# Patient Record
Sex: Female | Born: 1935 | Race: White | Hispanic: No | Smoking: Former smoker
Health system: Southern US, Community
[De-identification: ages and names within clinical notes are randomized; demographics above are authoritative.]

## PROBLEM LIST (undated history)

## (undated) DIAGNOSIS — J449 Chronic obstructive pulmonary disease, unspecified: Secondary | ICD-10-CM

## (undated) DIAGNOSIS — I714 Abdominal aortic aneurysm, without rupture, unspecified: Secondary | ICD-10-CM

## (undated) DIAGNOSIS — M359 Systemic involvement of connective tissue, unspecified: Secondary | ICD-10-CM

## (undated) DIAGNOSIS — E7800 Pure hypercholesterolemia, unspecified: Secondary | ICD-10-CM

## (undated) DIAGNOSIS — I10 Essential (primary) hypertension: Secondary | ICD-10-CM

## (undated) DIAGNOSIS — M810 Age-related osteoporosis without current pathological fracture: Secondary | ICD-10-CM

## (undated) DIAGNOSIS — K219 Gastro-esophageal reflux disease without esophagitis: Secondary | ICD-10-CM

## (undated) DIAGNOSIS — R0600 Dyspnea, unspecified: Secondary | ICD-10-CM

## (undated) DIAGNOSIS — G629 Polyneuropathy, unspecified: Secondary | ICD-10-CM

## (undated) DIAGNOSIS — I671 Cerebral aneurysm, nonruptured: Secondary | ICD-10-CM

## (undated) DIAGNOSIS — Z87898 Personal history of other specified conditions: Secondary | ICD-10-CM

## (undated) DIAGNOSIS — I639 Cerebral infarction, unspecified: Secondary | ICD-10-CM

## (undated) HISTORY — PX: OTHER SURGICAL HISTORY: SHX169

## (undated) HISTORY — PX: BACK SURGERY: SHX140

## (undated) HISTORY — PX: CAROTID ENDARTERECTOMY: SUR193

## (undated) HISTORY — PX: BRAIN SURGERY: SHX531

---

## 2004-09-21 ENCOUNTER — Ambulatory Visit: Payer: Self-pay | Admitting: Internal Medicine

## 2005-10-12 ENCOUNTER — Ambulatory Visit: Payer: Self-pay | Admitting: Internal Medicine

## 2006-06-09 ENCOUNTER — Ambulatory Visit: Payer: Self-pay | Admitting: Internal Medicine

## 2006-06-10 ENCOUNTER — Ambulatory Visit: Payer: Self-pay | Admitting: Internal Medicine

## 2006-07-08 ENCOUNTER — Ambulatory Visit: Payer: Self-pay | Admitting: Specialist

## 2006-10-20 ENCOUNTER — Ambulatory Visit: Payer: Self-pay | Admitting: Internal Medicine

## 2006-11-21 ENCOUNTER — Ambulatory Visit: Payer: Self-pay | Admitting: Internal Medicine

## 2007-01-14 ENCOUNTER — Other Ambulatory Visit: Payer: Self-pay

## 2007-01-14 ENCOUNTER — Emergency Department: Payer: Self-pay | Admitting: Emergency Medicine

## 2007-01-20 ENCOUNTER — Ambulatory Visit: Payer: Self-pay | Admitting: Internal Medicine

## 2007-11-02 ENCOUNTER — Ambulatory Visit: Payer: Self-pay | Admitting: Internal Medicine

## 2007-11-13 ENCOUNTER — Ambulatory Visit: Payer: Self-pay | Admitting: Internal Medicine

## 2008-11-04 ENCOUNTER — Ambulatory Visit: Payer: Self-pay | Admitting: Internal Medicine

## 2009-12-01 ENCOUNTER — Ambulatory Visit: Payer: Self-pay | Admitting: Internal Medicine

## 2010-12-03 ENCOUNTER — Ambulatory Visit: Payer: Self-pay | Admitting: Internal Medicine

## 2011-01-12 ENCOUNTER — Ambulatory Visit: Payer: Self-pay | Admitting: Internal Medicine

## 2011-01-19 ENCOUNTER — Ambulatory Visit: Payer: Self-pay | Admitting: Gastroenterology

## 2011-05-05 ENCOUNTER — Ambulatory Visit: Payer: Self-pay

## 2011-05-14 ENCOUNTER — Ambulatory Visit: Payer: Self-pay | Admitting: Unknown Physician Specialty

## 2011-05-14 DIAGNOSIS — I10 Essential (primary) hypertension: Secondary | ICD-10-CM

## 2011-05-18 ENCOUNTER — Ambulatory Visit: Payer: Self-pay | Admitting: Unknown Physician Specialty

## 2011-12-07 ENCOUNTER — Ambulatory Visit: Payer: Self-pay | Admitting: Internal Medicine

## 2012-12-07 ENCOUNTER — Ambulatory Visit: Payer: Self-pay | Admitting: Internal Medicine

## 2013-01-03 ENCOUNTER — Ambulatory Visit: Payer: Self-pay | Admitting: Internal Medicine

## 2013-01-05 ENCOUNTER — Ambulatory Visit: Payer: Self-pay | Admitting: Internal Medicine

## 2013-05-08 ENCOUNTER — Ambulatory Visit: Payer: Self-pay | Admitting: Internal Medicine

## 2013-05-25 ENCOUNTER — Ambulatory Visit: Payer: Self-pay | Admitting: Neurology

## 2013-12-10 ENCOUNTER — Ambulatory Visit: Payer: Self-pay | Admitting: Internal Medicine

## 2014-06-15 ENCOUNTER — Emergency Department: Payer: Self-pay | Admitting: Emergency Medicine

## 2014-06-15 LAB — BASIC METABOLIC PANEL
Anion Gap: 5 — ABNORMAL LOW (ref 7–16)
BUN: 17 mg/dL (ref 7–18)
CO2: 27 mmol/L (ref 21–32)
Calcium, Total: 8.5 mg/dL (ref 8.5–10.1)
Chloride: 102 mmol/L (ref 98–107)
Creatinine: 1.28 mg/dL (ref 0.60–1.30)
EGFR (African American): 52 — ABNORMAL LOW
GFR CALC NON AF AMER: 43 — AB
Glucose: 95 mg/dL (ref 65–99)
Osmolality: 270 (ref 275–301)
Potassium: 4.5 mmol/L (ref 3.5–5.1)
Sodium: 134 mmol/L — ABNORMAL LOW (ref 136–145)

## 2014-06-15 LAB — CBC
HCT: 37 % (ref 35.0–47.0)
HGB: 12.7 g/dL (ref 12.0–16.0)
MCH: 30.1 pg (ref 26.0–34.0)
MCHC: 34.5 g/dL (ref 32.0–36.0)
MCV: 87 fL (ref 80–100)
PLATELETS: 178 10*3/uL (ref 150–440)
RBC: 4.24 10*6/uL (ref 3.80–5.20)
RDW: 14.1 % (ref 11.5–14.5)
WBC: 5.6 10*3/uL (ref 3.6–11.0)

## 2014-06-15 LAB — TROPONIN I: Troponin-I: <0.02 ng/mL

## 2014-08-09 DIAGNOSIS — W19XXXA Unspecified fall, initial encounter: Secondary | ICD-10-CM | POA: Diagnosis present

## 2014-08-09 DIAGNOSIS — I639 Cerebral infarction, unspecified: Secondary | ICD-10-CM | POA: Diagnosis present

## 2014-08-09 DIAGNOSIS — U071 COVID-19: Secondary | ICD-10-CM | POA: Diagnosis present

## 2014-08-09 DIAGNOSIS — N183 Chronic kidney disease, stage 3 unspecified: Secondary | ICD-10-CM | POA: Diagnosis present

## 2014-08-09 HISTORY — DX: Cerebral infarction, unspecified: I63.9

## 2014-10-07 ENCOUNTER — Inpatient Hospital Stay: Payer: Self-pay | Admitting: Internal Medicine

## 2014-10-11 ENCOUNTER — Ambulatory Visit: Payer: Self-pay | Admitting: Neurology

## 2014-10-21 ENCOUNTER — Ambulatory Visit: Payer: Self-pay | Admitting: Internal Medicine

## 2014-12-02 LAB — SURGICAL PATHOLOGY

## 2014-12-08 NOTE — Discharge Summary (Signed)
PATIENT NAME:  Donna Quinn, Donna Quinn MR#:  [id number] DATE OF BIRTH:  02/21/1936  DATE OF ADMISSION:  10/07/2014 DATE OF DISCHARGE:  10/16/2014  REASON FOR ADMISSION: Right-sided weakness with slurred speech.   HISTORY OF PRESENT ILLNESS: Please see the dictated HPI done by Dr. Imogene Burnhen on 10/07/2014.   PAST MEDICAL HISTORY: 1.  Benign hypertension.  2.  COPD.  3.  Peripheral vascular disease with carotid artery stenosis.  4.  History of TIA.  5.  History of cerebral aneurysm.  6.  GE reflux disease.  7.  Hyperlipidemia.   MEDICATIONS ON ADMISSION: Please see admission note.   ALLERGIES: AGGRENOX, AUGMENTIN, PENICILLIN, REMERON, SULFA, AND XYZAL.   SOCIAL HISTORY, FAMILY HISTORY, AND REVIEW OF SYSTEMS: As per admission note.   PHYSICAL EXAMINATION: GENERAL: The patient was in no acute distress.  VITAL SIGNS: Stable and she was afebrile.  HEENT: Unremarkable.  NECK: Supple without JVD. No bruits were noted.  LUNGS: Clear without wheezes or rales.  CARDIAC: Revealed a regular rate and rhythm with normal S1, S2.  ABDOMEN: Soft and nontender.  EXTREMITIES: Without edema.  NEUROLOGIC: Grossly nonfocal other than some right-sided weakness.   HOSPITAL COURSE: The patient was admitted with possible CVA. She was placed on telemetry with aspirin and Plavix. Echocardiogram was unremarkable. MRI of the brain did show an acute infarct consistent with acute stroke syndrome. Carotid Dopplers showed a near-complete occlusion of left carotid artery. She was seen in consultation by vascular surgery who recommended carotid endarterectomy. She was subsequently placed on a heparin drip. She was seen by cardiology for cardiac clearance prior to left carotid endarterectomy. She was cleared by cardiology and underwent surgery on 10/10/2014. She tolerated the procedure well. She was monitored in the Intensive Care Unit overnight. She was then transferred out to the floor where she continued to do well. By 10/16/2014,  the patient was stable and ready for discharge.   DISCHARGE DIAGNOSES: 1.  Acute stroke syndrome.  2.  Peripheral vascular disease with carotid stenosis.  3.  Chronic obstructive pulmonary disease.  4.  Anemia.  5.  Status post left carotid endarterectomy.   DISCHARGE MEDICATIONS: 1.  Albuterol 2 puffs every 4 hours p.r.n. shortness of breath.  2.  Vitamin D3, 2000 units p.o. daily.  3.  Losartan 50 mg p.o. daily.  4.  Gabapentin 300 mg p.o. b.i.d.  5.  Advair 250/50 one puff b.i.d.  6.  Lovastatin 40 mg p.o. daily.  7.  Aspirin 81 mg p.o. daily.  8.  Plavix 75 mg p.o. daily.  9.  DuoNeb SVNs q.i.d.  10.   Norco 5/325 one p.o. every 4-6 hours as needed for pain.  11.   Zyprexa 5 mg p.o. daily.  12.   Lorazepam 0.5 mg p.o. q. 4 hours p.r.n. anxiety.  13.   Protonix 40 mg p.o. daily.   FOLLOWUP PLANS AND APPOINTMENTS: The patient was discharged with oxygen at 2 liters. She will be followed by home health. She is on a low-fat, low-cholesterol diet. She will follow up with me within 1 week's time, sooner if needed.      ____________________________ Duane LopeJeffrey D. Judithann SheenSparks, MD jds:at D: 10/24/2014 07:15:17 ET T: 10/24/2014 09:43:53 ET JOB#: [id number]  cc: Duane LopeJeffrey D. Judithann SheenSparks, MD, <Dictator> Koleen Celia Rodena Medin Ruby Dilone MD ELECTRONICALLY SIGNED 10/24/2014 12:14

## 2014-12-08 NOTE — H&P (Signed)
PATIENT NAME:  Donna Quinn, Donna Quinn MR#:  [id number] DATE OF BIRTH:  09/29/1935  DATE OF ADMISSION:  10/07/2014  PRIMARY CARE PHYSICIAN:  Sparks.   REFERRING PHYSICIAN:  Dr. York CeriseForbach.   CHIEF COMPLAINT: Right arm weakness for 2 days, slurred speech today.   HISTORY OF PRESENT ILLNESS: A 79 year old Caucasian female with a history of hypertension and COPD, presented to the ED with the above chief complaint. The patient is alert, awake, oriented, in no acute distress. The patient started to have right arm weakness 2 days ago which is better later, but the patient started to have slurred speech during lunchtime today, in addition right arm weakness became worse, so the patient comes to the ED for further evaluation. The patient denies any dysphagia or incontinence. Denies any extremity numbness or tingling. The patient denies any headache or dizziness.   PAST MEDICAL HISTORY:   1.  Hypertension.  2.  COPD.  3.  Carotid artery stenosis.  4.  TIA.  5.  Brain aneurysm.  6.  GERD.   7.  Hyperlipidemia.  8.  Possible PVD. According to the patient's daughter the patient has bilateral stent in bilateral groin area, but she does not know what kind of medical problem.   SOCIAL HISTORY: No smoking, alcohol drinking, or illicit drugs. The patient quit smoking 2 years ago.   FAMILY HISTORY:  Stroke, but no hypertension, no diabetes.   ALLERGIES:  AGGRENOX, AUGMENTIN, PENICILLIN, REMERON, SULFA DRUGS, XYZAL.   HOME MEDICATIONS: Medication reconciliation list is not done yet; but according to the patient the patient is taking aspirin, Plavix, and a statin at home. We will update later.   REVIEW OF SYSTEMS:  CONSTITUTIONAL: The patient denies any fever or chills. No headache or dizziness, but has weakness.  EYES: No double vision or blurred vision.  EARS, NOSE, AND, THROAT: No postnasal drip, but has slurred speech. No dysphagia.  CARDIOVASCULAR: No chest pain, palpitation, orthopnea, or nocturnal dyspnea.  No leg edema.  PULMONARY: No cough, sputum, but has shortness of breath occasionally due to COPD.  No wheezing. No hemoptysis.  GASTROINTESTINAL: No abdominal pain, nausea, vomiting, diarrhea. No melena or bloody stool.  GENITOURINARY: No dysuria, hematuria, or incontinence.  SKIN: No rash or jaundice.  NEUROLOGIC: No syncope, loss of consciousness, or seizure, but has right arm weakness and slurred speech.  No incontinence.  HEMATOLOGY: No easy bleeding or bleeding.  ENDOCRINOLOGY: No polyuria, polydipsia, heat or cold intolerance.   PHYSICAL EXAMINATION:  VITAL SIGNS: Temperature 98, blood pressure 188/84, pulse 75, O2 saturation 99% in room air.  GENERAL: The patient is alert, awake, oriented, in no acute distress.  HEENT: Pupils round, equal, and reactive to light and accommodation. Moist oral mucosa. Clear oropharynx.  NECK: Supple. No JVD or carotid bruit. No lymphadenopathy. No thyromegaly,  CARDIOVASCULAR: S1, S2 regular rate and rhythm. No murmurs, gallops.  PULMONARY: Bilateral air entry. No wheezing or rales. No use of accessory muscle to breathe.  ABDOMEN: Soft. No distention or tenderness. No organomegaly. Bowel sounds present.  EXTREMITIES: No edema, clubbing, or cyanosis. No calf tenderness. Bilateral pedal pulses present.  SKIN: No rash or jaundice.  NEUROLOGY: A and O x 3. No facial droop. Sensation intact. Power 4 out of 5 on bilateral upper extremities, and 3 out of 5 on the right lower extremity, and 4 out of 5 on left lower extremity. DTR mute.    LABORATORY DATA:  1.  Glucose 98, BUN 20, creatinine 1.43, sodium 139, potassium 4.2,  chloride 105, bicarbonate 27. Troponin less than 0.02. CBC in normal range except hemoglobin 11.3.  2.  CAT scan of head without contrast showed no acute intracranial abnormality, old lacunar infarct, and chronic small vessel ischemic disease, previous aneurysm coiling.   3.  EKG showed normal sinus rhythm at 65 BPM with incomplete right bundle  branch block and left atrial enlargement, left ventricular hypertrophy.   IMPRESSIONS:  1. Possible acute cerebrovascular accident.  2. Accelerated hypertension.  3. Acute renal failure.  4. History of carotid artery stenosis and peripheral vascular disease.  5. Chronic obstructive pulmonary disease.  6. Hyperlipidemia. 7. Gastroesophageal reflux disease.     PLAN OF TREATMENT:  1.  The patient will be admitted to telemetry floor. We will continue telemonitor. Continue aspirin and Plavix, and continue statin. In addition I will get an MRI of her brain, echocardiograph, and a carotid Duplex.  2.  For hypertension, I will continue the patient's home medications.  3.  COPD is stable.  4.  I discussed the patient's condition and plan of treatment with the patient, the patient's husband,  and daughter. The patient wants full code.   TIME SPENT: About 55 minutes.    ____________________________ Shaune Pollack, MD qc:bu D: 10/07/2014 16:44:40 ET T: 10/07/2014 17:32:18 ET JOB#: [id number]  cc: Shaune Pollack, MD, <Dictator> Shaune Pollack MD ELECTRONICALLY SIGNED 10/09/2014 17:07

## 2014-12-08 NOTE — Consult Note (Signed)
PATIENT NAME:  Donna Quinn, Donna Quinn MR#:  [id number] DATE OF BIRTH:  1936/02/22  DATE OF CONSULTATION:  10/10/2014  CONSULTING PHYSICIAN:  Marcina MillardAlexander Makynzi Eastland, MD  PRIMARY CARE PHYSICIAN:  Duane LopeJeffrey D. Judithann SheenSparks, MD  REFERRING PHYSICIAN:  Annice NeedyJason S Dew, MD  CHIEF COMPLAINT:  "I have slurred speech."   REASON FOR CONSULTATION: Consultation requested for cardiovascular evaluation prior to carotid surgery.   HISTORY OF PRESENT ILLNESS: The patient is a 79 year old female with history of hypertension, COPD and prior TIAs.  She was admitted on 10/07/2014, with a 2-day history of right arm weakness and slurred speech. Head CT was negative. MRI showed evidence for acute CVA. The patient was evaluated by Dr. Wyn Quakerew with a carotid ultrasound revealing near occlusive stenosis in the left carotid. Carotid angiogram revealed a near occlusive gradient at 95% stenosis in the left internal carotid artery. The patient was referred for preoperative evaluation prior to carotid endarterectomy. The patient denies prior history of myocardial infarction. She currently denies chest pain.   PAST MEDICAL HISTORY:  1.  Prior TIAs.  2.  Hypertension.  3.  Hyperlipidemia.  4.  COPD.  5.  Peripheral vascular disease.  6.  Osteoporosis.   MEDICATIONS:  Aspirin 81 mg daily, clopidogrel 75 mg daily, losartan 50 mg daily, lovastatin 40 mg daily, albuterol inhaler 2 puffs q.4 h. p.r.n., cholecalciferol 1 tablet daily, Advair Diskus 250/50, one inhalation q.12, gabapentin 300 mg b.i.d., DuoNeb nebulizer q.i.d., Zyprexa 5 mg at bedtime, and omeprazole 20 mg b.i.d.   SOCIAL HISTORY: The patient is married, resides with her husband, has a 50 pack-year tobacco abuse, quit in 2013.  FAMILY HISTORY:  Positive for myocardial infarction.   REVIEW OF SYSTEMS:    CONSTITUTIONAL: No fever or chills.  EYES: No blurry vision.  EARS: No hearing loss.  RESPIRATORY: Chronic exertional dyspnea due to underlying COPD.  CARDIOVASCULAR: No chest  pain.  GASTROINTESTINAL: No nausea, vomiting, or diarrhea.  GENITOURINARY: No dysuria or hematuria.  ENDOCRINE: No polyuria or polydipsia.  MUSCULOSKELETAL: No arthralgias or myalgias.  NEUROLOGICAL: No focal muscle weakness or numbness.  PSYCHOLOGICAL: No depression or anxiety.   PHYSICAL EXAMINATION:  VITAL SIGNS: Blood pressure 152/53, pulse 65, respirations 20, temperature 98.3, pulse oximetry 99%.  HEENT: Pupils equal and reactive to light and accommodation.  NECK: Supple without thyromegaly.  LUNGS: Revealed decreased breath sounds bilaterally.  CARDIOVASCULAR: Normal JVP. Normal PMI. Regular rate and rhythm. Normal S1, S2. No appreciable gallop, murmur, or rub.  ABDOMEN: Soft and nontender. Pulses were intact bilaterally.  MUSCULOSKELETAL: Normal muscle tone.  NEUROLOGIC: The patient is alert and oriented x3. Motor and sensory both grossly intact.   IMPRESSION: A 79 year old female with prior history of transient ischemic attack who presents with acute cerebrovascular accident with high-grade, greater than 95%, left carotid stenosis awaiting carotid endarterectomy. The patient has no prior history of myocardial infarction, currently denies chest pain, EKG is nondiagnostic. The patient should be an acceptable risk for surgery.   RECOMMENDATIONS:  1.  Agree with overall current therapy.  2.  Proceed with surgery as planned.  3.  Continue dual antiplatelet therapy.  4.  Would defer further cardiac evaluation at this time.     ____________________________ Marcina MillardAlexander Navdeep Halt, MD ap:at D: 10/10/2014 16:34:46 ET T: 10/10/2014 19:59:56 ET JOB#: [id number]  cc: Marcina MillardAlexander Millette Halberstam, MD, <Dictator> Marcina MillardALEXANDER Brittini Brubeck MD ELECTRONICALLY SIGNED 10/15/2014 10:09

## 2014-12-08 NOTE — Op Note (Signed)
PATIENT NAME:  Donna Quinn, Donna Quinn MR#:  [id number] DATE OF BIRTH:  Dec 25, 1935  DATE OF PROCEDURE:  10/14/2014  PREOPERATIVE DIAGNOSES:  1.  Near occlusive left carotid artery stenosis with recent stroke and recurring transient ischemic attack symptoms.  2.  Chronic obstructive pulmonary disease.  3.  Hypertension.  4.  Hyperlipidemia.   POSTOPERATIVE DIAGNOSES:   1.  Near occlusive left carotid artery stenosis with recent stroke and recurring transient ischemic attack symptoms.  2.  Chronic obstructive pulmonary disease.  3.  Hypertension.  4.  Hyperlipidemia.   PROCEDURE PERFORMED:  Left carotid endarterectomy with CorMatrix arterial reconstruction.   SURGEON:  Annice Needy, MD  ANESTHESIA: General.   BLOOD LOSS: 50 mL.   INDICATION FOR PROCEDURE: This is a 79 year old female who was admitted to the hospital last week with stroke-type symptoms. She has MRI positive for stroke in the left hemisphere and she has had 3 separate episodes of recurring aphasia and right-sided weakness that resolved quickly consistent with TIAs after her stroke. She was found to have a very near occlusive left carotid artery stenosis on both duplex and angiogram. She was kept on heparin, allowed to heal for about 1 week post stroke and taken to the Operating Room for endarterectomy. We discussed that this was a little sooner than we usually perform an endarterectomy after a stroke, but given the near occlusive nature of the stenosis and the recurring symptoms, early revascularization was indicated. Risks and benefits were discussed and informed consent was obtained.   DESCRIPTION OF PROCEDURE: The patient is brought to the operative suite and after an adequate level of general anesthesia was obtained, she was placed in modified beach chair position. A roll was placed under her shoulder and her neck was flexed and turned to the side and then her skin was sterilely prepped and draped. After appropriate surgical  antibiotics and timeouts, an incision was created along the anterior border of the sternocleidomastoid. We dissected down to the platysma with electrocautery. A crossing venous branch was ligated and divided between silk ties. This exposed the facial vein, which was ligated and divided between silk ties. The carotid bifurcation was very calcific. I anesthetized the bulb with 1% lidocaine. I then began dissecting out the bulb. I encircled the common carotid artery, external carotid artery, superior thyroid artery, and internal carotid artery distal to the lesion, and heparinized the patient. After the heparin had been allowed to circulate for about 5 minutes, control was pulled up on the vessel loops and anterior arteriotomy was created with an 11 blade and extended with Potts scissors. The Pruitt-Inahara shunt was then placed in the field. It was placed first in the internal carotid artery, flushed, de-aired, then in the common carotid artery. Approximately 1 to 2 minutes lapsed between clamping and shunt placement. I then performed an endarterectomy in the typical fashion with the elevator. An eversion endarterectomy was performed in the external carotid artery. The proximal endpoint was cut flush with tenotomy scissors and a nice feathered distal endpoint was created on the distal internal carotid artery. All loose flecks were removed and the vessel was locally heparinized. The distal endpoint was tacked down with three 7-0 Prolene sutures. A CorMatrix arterial patch was brought in the field. It was cut and beveled, and a 6-0 Prolene was started at the distal endpoint and run one half the length of the arteriotomy.  It was then cut and beveled to an appropriate length to match the arteriotomy and a second  6-0 Prolene was started at the proximal endpoint. The medial suture line was run together and tied. The lateral suture line was run approximately one quarter length of the length of the arteriotomy, and then the  Pruitt-Inahara shunt was then removed.  I then completed the suture line, flushing several cardiac cycles out the external carotid artery and flushing and de-airing through the external carotid artery prior to releasing control. On release, there was a nice pulse of the distal internal carotid artery distal to the endarterectomy. Approximately 3 minutes elapsed from shunt removal to restoration flow to the brain. The wound was then irrigated. Surgicel and Evicel topical hemostatic agents were placed. Hemostasis was complete. The sternocleidomastoid space was reapproximated with three 3-0 Vicryl sutures. The platysma was closed with a running 3-0 Vicryl and the skin was closed with a 4-0 Monocryl. Dermabond was placed as a dressing.   The patient was awakened from anesthesia and taken to the recovery room in stable condition, having tolerated the procedure well.    ____________________________ Annice NeedyJason S. Ellias Mcelreath, MD jsd:at D: 10/14/2014 15:19:00 ET T: 10/14/2014 18:00:18 ET JOB#: [id number]  cc: Annice NeedyJason S. Mekiah Cambridge, MD, <Dictator> Annice NeedyJASON S Kanai Berrios MD ELECTRONICALLY SIGNED 10/17/2014 10:41

## 2014-12-08 NOTE — Consult Note (Signed)
CHIEF COMPLAINT and HISTORY:  Subjective/Chief Complaint aphasia, right arm and leg weakness   History of Present Illness Patient is admitted two days ago with episodes of confusion and aphasia.  Husband reports another episode since admission.  She had been having problems with speech for two days prior to admission.  Also noted right arm was a little weak but this is better now.  Husband reports she was dragging right foot when she walked as well.  No left arm/leg symptoms.  No visual symptoms.  Has previous history of TIA and multiple areas of PAD.  Has had stents for PAD previously.  Had an MRI yesterday which shows an acute stroke in left hemisphere.  Duplex was done which shows velocities just into the 50-69% range on the right.  The left carotid is read as likely a near occlusive stenosis with lower velocities in the ICA than the CCA.  Vertebrals are antegrade.   PAST MEDICAL/SURGICAL HISTORY:  Past Medical History:   TIA - Transient Ischemic Attack:    Aneurysm:    GERD - Esophageal Reflux:    Hypercholesterolemia:    Hypertension:    COPD:    Denies surgical history.:   ALLERGIES:  Allergies:  Sulfa drugs: Anaphylaxis  Penicillin: Unknown  Aggrenox: Headaches  Augmentin ES-600: Unknown  Remeron: Unknown  Xyzal: Unknown  HOME MEDICATIONS:  Home Medications: Medication Instructions Status  albuterol CFC free 90 mcg/inh aerosol 2 puff(s) inhaled every 4 hours, As Needed - for Wheezing Active  aspirin 81 mg oral tablet 1 tab(s) orally once a day (at bedtime) Active  Vitamin D3 2000 intl units oral tablet 1 tab(s) orally once a day (in the morning) Active  clopidogrel 75 mg oral tablet 1 tab(s) orally once a day (in the morning) Active  Advair Diskus 250 mcg-50 mcg inhalation powder 1 puff(s) inhaled 2 times a day Active  gabapentin 300 mg oral capsule 1 cap(s) orally 2 times a day Active  DuoNeb 2.5 mg-0.5 mg/3 mL inhalation solution 3 milliliter(s) inhaled 4 times a  day, As Needed - for Shortness of Breath Active  losartan 50 mg oral tablet 1 tab(s) orally once a day (in the morning) Active  lovastatin 40 mg oral tablet 1 tab(s) orally once a day (at bedtime) Active  omeprazole 20 mg oral delayed release capsule 1 cap(s) orally 2 times a day Active   Family and Social History:  Family History Stroke  Smoking   Social History positive tobacco (Greater than 1 year), negative ETOH, negative Illicit drugs   + Tobacco Prior (greater than 1 year)   Place of Living Home   Review of Systems:  Subjective/Chief Complaint stroke symptoms as per HPI No heat or cold intolerance No dysuria/hematuria No blurry or double vision No tinnitus or ear pain No rashes or ulcer No suicidal ideation or psychosis No signs of bleeding or easy bruising No SOB/DOE, orthopnea, or sputum No palpitations or chest pain No N/V/D or abdominal pain No joint pain or joint swelling No fever or chills No unintentional weight loss or gain   Medications/Allergies Reviewed Medications/Allergies reviewed   Physical Exam:  GEN well developed, well nourished, no acute distress   HEENT pink conjunctivae, PERRL, moist oral mucosa   NECK No masses  trachea midline   RESP normal resp effort  clear BS  no use of accessory muscles   CARD regular rate  positive carotid bruits  no JVD   VASCULAR ACCESS none   ABD denies tenderness  soft  normal BS   GU no superpubic tenderness   LYMPH negative neck, negative axillae   EXTR negative cyanosis/clubbing, negative edema, good cap refill, pedal pulses trace-1+ bilaterally   SKIN normal to palpation, No rashes, No ulcers, skin turgor decreased   NEURO cranial nerves intact, follows commands, motor/sensory function intact, arm and leg strength appear ok at this time.  Not seen her walk.  No obvious facial droop.  Able to get words out and converse reasonably well   PSYCH alert, A+O to time, place, person   LABS:  Laboratory  Results: LabObservation:    01-Mar-16 08:35, Echo Doppler  OBSERVATION   Reason for Test  Hepatic:    29-Feb-16 15:22, Comprehensive Metabolic Panel  Bilirubin, Total 0.3  Alkaline Phosphatase 64  SGPT (ALT) 15  SGOT (AST) 16  Total Protein, Serum 6.8  Albumin, Serum 3.5  Cardiology:    29-Feb-16 15:17, ED ECG  Ventricular Rate 65  Atrial Rate 65  P-R Interval 136  QRS Duration 100  QT 450  QTc 468  P Axis 65  R Axis -38  T Axis 55  ECG interpretation   Normal sinus rhythm  Possible Left atrial enlargement  Left axis deviation  Incomplete right bundle branch block  Left ventricular hypertrophy  Cannot rule out Septal infarct , age undetermined  Abnormal ECG  When compared with ECG of 15-Jun-2014 12:08,  Minimal criteria for Septal infarct are now Present  T wave inversion no longer evident in Inferior leads  ----------unconfirmed----------  Confirmed by OVERREAD, NOT ([id number]), editor PEARSON, BARBARA ([id number]) on 10/08/2014 11:41:33 AM  ED ECG     01-Mar-16 08:35, Echo Doppler  Echo Doppler   REASON FOR EXAM:      COMMENTS:       PROCEDURE: [place] - ECHO DOPPLER COMPLETE(TRANSTHOR)  - Oct 08 2014  8:35AM     RESULT: Echocardiogram Report    Patient Name:   Donna Quinn Date of Exam: 10/08/2014  Medical Rec #:  [id number]          Custom1:  Dateof Birth:  07/17/1936        Height:       66.0 in  Patient Age:    79 years        Weight:       139.0 lb  Patient Gender: F               BSA:          1.71 m??    Indications: CVA  Sonographer:    Sherrie Sport RDCS  Referring Phys: Doy Hutching, JEFFREY, D    Sonographer Comments: Technically fair study.    Summary:   1. Left ventricular ejection fraction, by visual estimation, is 65 to   70%.   2. Normal global left ventricular systolic function.   3. Mildly increased left ventricular posterior wall thickness.  2D AND M-MODE MEASUREMENTS (normal ranges within parentheses):  Left Ventricle:          Normal  IVSd (2D):      1.13 cm  (0.7-1.1)  LVPWd (2D):     1.20 cm (0.7-1.1) Aorta/LA:                  Normal  LVIDd (2D):     4.41 cm (3.4-5.7) Aortic Root (2D): 2.50 cm (2.4-3.7)  LVIDs (2D):     2.74 cm           Left Atrium (2D): 3.70  cm (1.9-4.0)  LV FS (2D):     37.9 %   (>25%)  LV EF (2D):     68.2 %   (>50%)                                    Right Ventricle:   RVd (2D):        3.71 cm  LV DIASTOLIC FUNCTION:  MV Peak E: 0.85 m/s E/e' Ratio: 17.80  MV Peak A: 1.25 m/s Decel Time: 215 msec  E/A Ratio: 0.68  SPECTRAL DOPPLER ANALYSIS (where applicable):  Mitral Valve:  MV P1/2 Time: 62.35 msec  MV Area, PHT:3.53 cm??  Aortic Valve: AoV Max Vel: 1.30 m/s AoV Peak PG: 6.7 mmHg AoV Mean PG:  LVOT Vmax: 0.91 m/s LVOT VTI:  LVOT Diameter: 2.00 cm  AoV Area, Vmax: 2.20 cm?? AoV Area, VTI:  AoV Area, Vmn:  Tricuspid Valve and PA/RV Systolic Pressure: TR Max Velocity: 2.78 m/s RA   Pressure: 5 mmHg RVSP/PASP: 35.9 mmHg  Pulmonic Valve:  PV Max Velocity: 0.93 m/s PV Max PG: 3.5 mmHg PV Mean PG:    PHYSICIAN INTERPRETATION:  Left Ventricle: The left ventricular internal cavity size was normal. LV   septal wall thickness was normal. LV posterior wall thickness was mildly   increased. Global LV systolic function was normal. Left ventricular   ejection fraction, by visual estimation, is 65 to 70%.  Right Ventricle: The right ventricular size is normal. Global RV systolic   function is normal.  Left Atrium: The left atrium is normal in size.  Right Atrium: The right atrium is normal in size.  Pericardium: There is no evidence of pericardial effusion.  Mitral Valve: The mitral valve is normal in structure. Trace mitral valve   regurgitation is seen.  Tricuspid Valve: The tricuspid valve is normal. Trivial tricuspid   regurgitation is visualized. The tricuspid regurgitant velocity is 2.78   m/s, and with an assumed right atrial pressure of 5 mmHg, the estimated   right ventricular systolic pressure is normal at 35.9  mmHg.  Aortic Valve: The aortic valve is normal. No evidence of aortic valve   regurgitation is seen.  Pulmonic Valve: The pulmonic valve is normal.    [place] MD  Electronically signed by [place] MD  Signature Date/Time: 10/08/2014/4:54:17 PM    *** Final ***  IMPRESSION: .        Verified By: Yolonda Kida, M.D., MD  Routine Chem:    29-Feb-16 15:22, Comprehensive Metabolic Panel  Glucose, Serum 98  BUN 20  Creatinine (comp) 1.43  Sodium, Serum 139  Potassium, Serum 4.2  Chloride, Serum 105  CO2, Serum 27  Calcium (Total), Serum 8.5  Osmolality (calc) 280  eGFR (African American) 46  eGFR (Non-African American) 38  eGFR values <26m/min/1.73 m2 may be an indication of chronic  kidney disease (CKD).  Calculated eGFR, using the MRDR Study equation, is useful in   patients with stable renal function.  The eGFR calculation will not be reliable in acutely ill patients  when serum creatinine is changing rapidly. It is not useful in  patients on dialysis. The eGFR calculation may not be applicable  to patients at the low and high extremes of body sizes, pregnant  women, and vegetarians.  Anion Gap 7    01-Mar-16 04:00, Lipid Profile ([facility])  Cholesterol, Serum 134  Triglycerides, Serum 99  HDL (INHOUSE) 57  VLDL Cholesterol Calculated 20  LDL Cholesterol Calculated 57  Result(s) reported on 08 Oct 2014 at 05:16AM.    02-Mar-16 16:94, Basic Metabolic Panel (w/Total Calcium)  Glucose, Serum 89  BUN 26  Creatinine (comp) 1.37  Sodium, Serum 142  Potassium, Serum 4.2  Chloride, Serum 109  CO2, Serum 25  Calcium (Total), Serum 8.2  Anion Gap 8  Osmolality (calc) 287  eGFR (African American) 48  eGFR (Non-African American) 40  eGFR values <58m/min/1.73 m2 may be an indication of chronic  kidney disease (CKD).  Calculated eGFR, using the MRDR Study equation, is useful in   patients with stable renal function.  The eGFR calculation will  not be reliable in acutely ill patients  when serum creatinine is changing rapidly. It is not useful in  patients on dialysis. The eGFR calculation may not be applicable  to patients at the low and high extremes of body sizes, pregnant  women, and vegetarians.  Cardiac:    29-Feb-16 15:22, Troponin I  Troponin I < 0.02  0.00-0.05  0.05 ng/mL or less: NEGATIVE   Repeat testing in 3-6 hrs   if clinically indicated.  >0.05 ng/mL: POTENTIAL   MYOCARDIAL INJURY. Repeat   testing in 3-6 hrs if   clinically indicated.  NOTE: An increase or decrease   of 30% or more on serial   testing suggests a   clinically important change  Routine UA:    29-Feb-16 15:22, Urinalysis  Color (UA) Straw  Clarity (UA) Clear  Glucose (UA) Negative  Bilirubin (UA) Negative  Ketones (UA) Negative  Specific Gravity (UA) 1.005  Blood (UA) Negative  pH (UA) 6.0  Protein (UA) Negative  Nitrite (UA) Negative  Leukocyte Esterase (UA) Negative  Result(s) reported on 07 Oct 2014 at 04:36PM.  RBC (UA)   NONE SEEN  WBC (UA)   NONE SEEN  Bacteria (UA)   NONE SEEN  Epithelial Cells (UA) 4 /HPF  Result(s) reported on 07 Oct 2014 at 04:36PM.  Routine Coag:    29-Feb-16 15:22, Activated PTT  Activated PTT (APTT) 37.3  A HCT value >55% may artifactually increase the APTT. In one study,  the increase was an average of 19%.  Reference: "Effect on Routine and Special Coagulation Testing Values  of Citrate Anticoagulant Adjustment in Patients with High HCT Values."  American Journal of Clinical Pathology 2006;126:400-405.    29-Feb-16 15:22, Prothrombin Time  Prothrombin 12.9  11.4-15.0  NOTE: New Reference Range   09/06/14  INR 1.0  INR reference interval applies to patients on anticoagulant therapy.  A single INR therapeutic range for coumarins is not optimal for all  indications; however, the suggested range for most indications is  2.0 - 3.0.  Exceptions to the INR Reference Range may include:  Prosthetic heart  valves, acute myocardial infarction, prevention of myocardial  infarction, and combinations of aspirin and anticoagulant. The need  for a higher or lower target INR must be assessed individually.  Reference: The Pharmacology and Management of the Vitamin K   antagonists: the seventh ACCP Conference on Antithrombotic and  Thrombolytic Therapy. CHWTUU.8280Sept:126 (3suppl): [id number]  A HCT value >55% may artifactually increase the PT.  In one study,   the increase was an average of 25%.  Reference:  "Effect on Routine and Special Coagulation Testing Values  of Citrate Anticoagulant Adjustment in Patients with High HCT Values."  American Journal of Clinical Pathology 2006;126:400-405.  Routine Hem:    29-Feb-16 15:22, CBC  Profile  WBC (CBC) 5.7  RBC (CBC) 4.04  Hemoglobin (CBC) 11.3  Hematocrit (CBC) 35.1  Platelet Count (CBC) 168  MCV 87  MCH 28.0  MCHC 32.2  RDW 14.2  Neutrophil % 66.8  Lymphocyte % 19.4  Monocyte % 10.3  Eosinophil % 2.7  Basophil % 0.8  Neutrophil # 3.8  Lymphocyte # 1.1  Monocyte # 0.6  Eosinophil # 0.2  Basophil # 0.0  Result(s) reported on 07 Oct 2014 at 03:50PM.    02-Mar-16 04:05, CBC Profile  WBC (CBC) 4.6  RBC (CBC) 3.79  Hemoglobin (CBC) 10.6  Hematocrit (CBC) 33.0  Platelet Count (CBC) 150  MCV 87  MCH 28.0  MCHC 32.1  RDW 14.0  Neutrophil % 58.1  Lymphocyte % 25.6  Monocyte % 11.5  Eosinophil % 3.9  Basophil % 0.9  Neutrophil # 2.7  Lymphocyte # 1.2  Monocyte # 0.5  Eosinophil # 0.2  Basophil # 0.0  Result(s) reported on 09 Oct 2014 at 05:05AM.   RADIOLOGY:  Radiology Results: XRay:    07-Nov-15 13:01, Chest PA and Lateral  Chest PA and Lateral  REASON FOR EXAM:    shortness of breath  COMMENTS:       PROCEDURE: DXR - DXR CHEST PA (OR AP) AND LATERAL  - Jun 15 2014  1:01PM     CLINICAL DATA:  Shortness of breath for 1 week    EXAM:  CHEST  2 VIEW    COMPARISON:  01/19/2011    FINDINGS:  Heart  size is normal. Leftward curvature of the thoracic spine noted  centered at its mid portion. Mild bilateral diaphragmatic  eventration is present with flattening of the hemidiaphragms  suggesting emphysema. No focal pulmonary opacity. No pleural  effusion. Stable right upper lobe nodular opacity compatible with a  benign nodule given more than 3 years radiographic stability.     IMPRESSION:  No acute cardiopulmonary process. Mild hyperinflation again  suggesting COPD.      Electronically Signed    By: Conchita Paris M.D.    On: 06/15/2014 13:34       Verified By: Arline Asp, M.D.,  [place]:    01-Mar-16 09:40, US Carotid Doppler Bilateral  US Carotid Doppler Bilateral  REASON FOR EXAM:    CVA  COMMENTS:       PROCEDURE: [place]  - US CAROTID DOPPLER BILATERAL  - Oct 08 2014  9:40AM     CLINICAL DATA:  Acute cerebral infarction affecting left-sided deep  periventricular white matter. History of hypertension and  hyperlipidemia.    EXAM:  BILATERAL CAROTID DUPLEX ULTRASOUND    TECHNIQUE:  Pearline Cables scale imaging, color Doppler and duplex ultrasound were  performed of bilateral carotid and vertebral arteries in the neck.  COMPARISON:  None.    FINDINGS:  Criteria: Quantification of carotid stenosis is based on velocity  parameters that correlate the residual internal carotid diameter  with NASCET-based stenosis levels, using the diameter of the distal  internal carotid lumen as the denominator for stenosis measurement.    The following velocity measurements were obtained:    RIGHT    ICA:  141/44 cm/sec    CCA:  562/13 cm/sec  SYSTOLIC ICA/CCA RATIO:  1.3    DIASTOLIC ICA/CCA RATIO:  1.6    ECA:  194 cm/sec    LEFT    ICA:  52/12 cm/sec    CCA:  08/65 cm/sec    SYSTOLIC ICA/CCA RATIO:  0.7  DIASTOLIC ICA/CCA RATIO:  0.7  ECA:  198 cm/sec    RIGHT CAROTID ARTERY: There is a moderate amount of predominately  calcified plaque present at the level of the  common carotid artery  in the neck, carotid bulb and proximal internal carotid artery. The  proximal ICA demonstrates mildly turbulent flow with velocity  elevation corresponding to an estimated 50- 69% stenosis.    RIGHT VERTEBRAL ARTERY: Antegrade flow with normal waveform and  velocity.    LEFT CAROTID ARTERY: There is a large amount of predominately  calcified plaque present beginning at the level of the mid to distal  common carotid artery and most prominently involving the carotid  bulb and proximal ICA. Extensive shadowing is present with turbulent  flow. Waveforms at the level of the carotid bulb appear fairly  normal. Sampled segments in the internal carotid artery show a  tardus parvus waveform with reduced velocities. Although upstream  obstructed disease cannot be completely excluded by current  ultrasound, the finding is most concerning for a nearly obstructive  stenosis of the proximal internal carotid artery with slow flow  distal to the stenosis.    LEFT VERTEBRAL ARTERY: Antegrade flow with normal waveform and  velocity.     IMPRESSION:  1. Moderate calcified plaque involving the right common carotid  artery, bulb and proximal internal carotid artery with estimated 50-  69% right ICA stenosis.  2. Substantial plaque at the level of the left common carotid  artery, carotid bulb and proximal ICA with abnormal parvus tardus  waveforms in the left internal carotid artery. Findings are  concerning for a nearly obstructive stenosis of the proximal ICA  with slow flow. Further confirmation of degree of obstructive  disease could be obtained by CT angiography, especially if surgical  or endovascular treatment is being considered.      Electronically Signed    By: Aletta Edouard M.D.    On: 10/08/2014 11:06         Verified By: Azzie Roup, M.D.,  [place]:    04-May-15 11:02, Screening Digital Mammogram  PACS Image    [phone] 13:01, Chest PA and  Lateral  PACS Image    07-Nov-15 15:01, CT ANGIOGRAPHY Chest with for PE  PACS Image    29-Feb-16 15:31, CT Head Without Contrast  PACS Image    01-Mar-16 08:32, MRI Brain Without Contrast  PACS Image    01-Mar-16 09:40, US Carotid Doppler Bilateral  PACS Image  MRI:    01-Mar-16 08:32, MRI Brain Without Contrast  MRI Brain Without Contrast  REASON FOR EXAM:    CVA  COMMENTS:       PROCEDURE: MR  - MR BRAIN WO CONTRAST  - Oct 08 2014  8:32AM     CLINICAL DATA:  Aphasia and right sided weakness yesterday. The  symptoms have since improved. Personal history of aneurysm coiling.    EXAM:  MRI HEAD WITHOUT CONTRAST    TECHNIQUE:  Multiplanar, multiecho pulse sequences of the brain and surrounding  structures were obtained without intravenous contrast.  COMPARISON:  CT 10/07/14.  MRI head 05/08/2013.    FINDINGS:  A punctate non-hemorrhagicleft periventricular infarct is present  adjacent to the frontal horn of the left lateral ventricle.    Extensive periventricular and subcortical white matter changes are  otherwise stable. A remote lacunar infarct of the left basal ganglia  is stable. Remote lacunar infarcts of the cerebellum are stable.    No acute hemorrhage or mass lesion  is present. The ventricles are  proportional to the atrophy. No significant extra-axial fluid  collection is present.    Flow is present in the major intracranial arteries. Coiling of a  left ICA terminus aneurysm is noted.    Mild mucosal thickening is noted in the maxillary sinuses and  ethmoid air cells bilaterally. No fluid levels are present. Midline  structures are within normal limits.     IMPRESSION:  Acute non-hemorrhagic punctate periventricular infarct adjacent to  the frontal horn of the left lateral ventricle.    Stable diffuse white matter changes consistent with chronic  microvascular ischemia.    Remote lacunar infarcts of the left basal ganglia and  bilateral  cerebellum.  Minimal sinus disease.    These results will be called to the ordering clinician or  representative by the Radiologist Assistant, and communication  documented in the PACS or zVision Dashboard.      Electronically Signed    By: San Morelle M.D.    On: 10/08/2014 08:55         Verified By: Resa Miner. MATTERN, M.D.,  [place]:    29-Feb-16 15:31, CT Head Without Contrast  CT Head Without Contrast  REASON FOR EXAM:    difficulty ambulating; unequal grip strength; hx of   brain aneurysm  COMMENTS:       PROCEDURE: CT  - CT HEAD WITHOUT CONTRAST  - Oct 07 2014  3:31PM     CLINICAL DATA:  Recurrent aphasia. Unequal grip strength. Difficulty  ambulating. History of aneurysmal correlating 4 years ago.    EXAM:  CT HEAD WITHOUT CONTRAST    TECHNIQUE:  Contiguous axial images were obtained from the base of the skull  through the vertex without intravenous contrast.  COMPARISON:  CT scan dated 01/03/2013 and MRI dated 05/08/2013.    FINDINGS:  There is no acute intracranial hemorrhage, infarction, or mass  lesion.    Aneurysm coils seen in the left MCA aneurysm, unchanged.    Old left basal ganglia infarcts. Multiple periventricular white  matter lucencies consistent with chronic small vessel ischemic  disease, stable.No acute osseous abnormality.     IMPRESSION:  No acute intracranial abnormality. Old lacunar infarct and chronic  small vessel ischemic disease. Previous aneurysm coiling.  Electronically Signed    By: Lorriane Shire M.D.    On: 10/07/2014 15:56         Verified By: Larey Seat, M.D.,  [facility]:    04-May-15 11:02, Screening Digital Mammogram  Screening Digital Mammogram  REASON FOR EXAM:    SCR MAMMO NO ORDER  COMMENTS:       PROCEDURE: MAM - MAM DGTL SCRN MAM NO ORDER W/CAD  - Dec 10 2013 11:02AM     CLINICAL DATA:  Screening.    EXAM:  DIGITAL SCREENING BILATERAL MAMMOGRAM WITH CAD    COMPARISON:   Previous exam(s).    ACR Breast Density Category c: The breast tissue is heterogeneously  dense, which may obscure small masses.  FINDINGS:  There are no findings suspicious for malignancy. Images were  processed with CAD.     IMPRESSION:  No mammographic evidence of malignancy. A result letter of this  screening mammogram will be mailed directly to the patient.    RECOMMENDATION:  Screening mammogram in one year. (Code:[id number]-B-01Y)    BI-RADS CATEGORY  1: Negative.      Electronically Signed    By: Shon Hale M.D.    On: 12/10/2013 13:07  Verified By: Glenice Bow, M.D.,   ASSESSMENT AND PLAN:  Assessment/Admission Diagnosis acute stroke left hemisphere near occlusion of left carotid by duplex, moderate right stenosis PAD Other medical issues as above   Plan Had an MRI yesterday which shows an acute stroke in left hemisphere.  Duplex was done which shows velocities just into the 50-69% range on the right.  The left carotid is read as likely a near occlusive stenosis with lower velocities in the ICA than the CCA.  Vertebrals are antegrade.  Renal function was decreased although better today than yesterday.  Would plan CT angiogram if renal function will allow.  Will recheck this tomorrow am, and if renal function will not allow CT angiogram, will do catheter based angiogram with limited dye usage.  Needs further evaluation to confirm this is not a complete occlusion, which would be managed with anticoagulation alone. Normally wait about two weeks after stroke for revascularization, but if is near complete occlusion or thromus present, may consider earlier intervention especially with symptoms much better.  Will keep NPO in case angio needed tomorrow.  BMP ordered for am   level 4 consult   Electronic Signatures: Algernon Huxley (MD)  (Signed 02-Mar-16 15:23)  Authored: Chief Complaint and History, PAST MEDICAL/SURGICAL HISTORY, ALLERGIES, HOME MEDICATIONS, Family and  Social History, Review of Systems, Physical Exam, LABS, RADIOLOGY, Assessment and Plan   Last Updated: 02-Mar-16 15:23 by Algernon Huxley (MD)

## 2014-12-08 NOTE — Consult Note (Signed)
PATIENT NAME:  Donna Quinn, Donna Quinn MR#:  [id number] DATE OF BIRTH:  12/27/1935  DATE OF CONSULTATION:  10/11/2014  REFERRING PHYSICIAN:   CONSULTING PHYSICIAN:  Pauletta BrownsYuriy Gerri Acre, MD  HISTORY OF PRESENT ILLNESS: A 79 year old female presenting with aphasia and right arm and right leg weakness. The patient was found to have left frontal horn infarcts. The patient was found to have significant 95% stenosis in the left carotid. Currently, symptoms have improved.   NEUROLOGIC EVALUATION: The patient is alert, awake and oriented to time, place, location, and reason why she is in the  hospital. Facial sensation intact. Facial motor is intact. Tongue is midline. Motor strength 5/5 bilateral upper and lower extremities. Extraocular movements intact. Gait not assessed.   IMPRESSION: A 79 year old female with history of chronic obstructive pulmonary disease, admitted with weakness on the right side and found to have 95% stenosis on the left carotid with left frontal horn infarct.   PLAN: I agree with vascular intervention that is to be done on Monday. Continue dual antiplatelet therapy. Please call with any questions     ____________________________ Pauletta BrownsYuriy Thayer Embleton, MD yz:mw D: 10/11/2014 16:11:53 ET T: 10/11/2014 16:44:40 ET JOB#: [id number]  cc: Pauletta BrownsYuriy Braylee Bosher, MD, <Dictator> Pauletta BrownsYURIY Geremy Rister MD ELECTRONICALLY SIGNED 10/24/2014 12:11

## 2014-12-08 NOTE — Op Note (Signed)
PATIENT NAME:  Donna Quinn, HASELTON MR#:  [id number] DATE OF BIRTH:  12/09/1935  DATE OF PROCEDURE:  10/10/2014  PREOPERATIVE DIAGNOSES:  1.  Near occlusive left carotid artery stenosis and mild to moderate right carotid artery stenosis.  2.  Renal insufficiency precluding CT angiogram.  3.  Recent stroke with left hemispheric symptoms and MRI positive for acute stroke on the left.   POSTOPERATIVE DIAGNOSES:  1.  Near occlusive left carotid artery stenosis and mild to moderate right carotid artery stenosis.  2.  Renal insufficiency precluding CT angiogram.  3.  Recent stroke with left hemispheric symptoms and MRI positive for acute stroke on the left.   PROCEDURES:  1.  Ultrasound guidance for vascular access to right femoral artery.  2.  Catheter placement to right common carotid artery and left common carotid artery from right femoral approach.  3.  Thoracic aortogram and bilateral cervical and cerebral carotid angiograms.   SURGEON: Annice Needy, MD.   ANESTHESIA: Local with moderate conscious sedation.   ESTIMATED BLOOD LOSS: 25 mL.    FLUOROSCOPY TIME:  Approximate 3 minutes.   CONTRAST USED: 60 mL.   INDICATION FOR PROCEDURE: This is a 79 year old white female with recent admission for right arm and leg weakness and aphasia. She was found to have an MRI showing an acute left hemispheric stroke. An ultrasound was performed showing near occlusive stenosis of left carotid artery and stenoses approximated the 50-70% range on the right. Carotid angiogram was performed. A CT angiogram was precluded due to kidney disease as well as being able to evaluate her intracranial filling and safety for surgery. Risks and benefits were discussed. Informed consent was obtained.   DESCRIPTION OF PROCEDURE: The patient was brought to the vascular suite. Groins were shaved and prepped and a sterile surgical field was created. The right femoral head was localized with fluoroscopy. The right femoral artery  was visualized with ultrasound and found to be patent. It was accessed under direct ultrasound guidance without difficulty with a Seldinger needle. J-wire and 5 French sheath were placed. The patient was given 4000 units of intravenous heparin for systemic anticoagulation and a pigtail catheter was then placed in the ascending aorta. An LAO projection thoracic aortogram was performed. This showed calcific disease of the innominate, left common carotid, and left subclavian artery at the origins, none of which appeared to be more than 30%. The aorta itself had some calcific disease, but no significant stenosis or dissection. I then used a JB2 catheter. There was some significant reverse curve of the vessels and this reverse curve catheter was necessary. First the innominate artery was cannulated and we advanced easily into the right common carotid artery.  Imaging was performed in this location in the right common carotid artery to evaluate both the cervical and cerebral filling. The cervical carotid artery had what appeared to be a mild stenosis in the 30-40% range. There was a slight ulceration present and some calcific disease that was moderate. The intracranial filling was brisk throughout the anterior and middle cerebral arteries and there was brisk cross-filling right to left knee anterior cerebral artery. On the filling there appeared to be coils near the carotid bifurcation and intracranially in the anterior middle cerebral artery as if there had been a previous aneurysm coil in this location.  I had not gotten this on the patient's questioning yesterday in consultation. I then went back to the aortic arch shot, I selectively cannulated the left common carotid artery without difficulty  with a JB2 catheter and advanced into the mid left common carotid artery. Cervical and cerebral imaging was then performed. This showed a very high-grade, near occlusive stenosis of the internal carotid artery just beyond its  origin. This was in excess of 95%, the intracranial flow was quite sluggish, there did appear to be middle cerebral filling, but not much anterior cerebral filling, but again this was quite sluggish due to the high-grade proximal stenosis that was seen in both in LAO and lateral projections. I then elected to terminate the procedure. The sheath was removed. StarClose closure device was deployed in the right femoral artery with excellent hemostatic result. The patient tolerated the procedure well and was taken to the recovery room in stable condition.    ____________________________ Annice NeedyJason S. Amarii Amy, MD jsd:bu D: 10/10/2014 12:14:09 ET T: 10/10/2014 20:48:01 ET JOB#: [id number]  cc: Annice NeedyJason S. Haskell Rihn, MD, <Dictator> Annice NeedyJASON S Gal Smolinski MD ELECTRONICALLY SIGNED 10/17/2014 10:40

## 2014-12-19 ENCOUNTER — Other Ambulatory Visit: Payer: Self-pay | Admitting: Internal Medicine

## 2014-12-19 DIAGNOSIS — Z1231 Encounter for screening mammogram for malignant neoplasm of breast: Secondary | ICD-10-CM

## 2015-01-02 ENCOUNTER — Ambulatory Visit: Payer: Self-pay

## 2015-03-05 ENCOUNTER — Other Ambulatory Visit: Payer: Self-pay | Admitting: Internal Medicine

## 2015-03-05 DIAGNOSIS — R9389 Abnormal findings on diagnostic imaging of other specified body structures: Secondary | ICD-10-CM

## 2015-03-12 ENCOUNTER — Ambulatory Visit
Admission: RE | Admit: 2015-03-12 | Discharge: 2015-03-12 | Disposition: A | Discharge status: Home / Self Care | Payer: Medicare Other | Source: Ambulatory Visit | Attending: Internal Medicine | Admitting: Internal Medicine

## 2015-03-12 ENCOUNTER — Other Ambulatory Visit: Payer: Self-pay | Admitting: Internal Medicine

## 2015-03-12 DIAGNOSIS — Z1231 Encounter for screening mammogram for malignant neoplasm of breast: Secondary | ICD-10-CM | POA: Insufficient documentation

## 2015-03-12 DIAGNOSIS — I2584 Coronary atherosclerosis due to calcified coronary lesion: Secondary | ICD-10-CM | POA: Insufficient documentation

## 2015-03-12 DIAGNOSIS — Z803 Family history of malignant neoplasm of breast: Secondary | ICD-10-CM | POA: Insufficient documentation

## 2015-03-12 DIAGNOSIS — E278 Other specified disorders of adrenal gland: Secondary | ICD-10-CM | POA: Diagnosis not present

## 2015-03-12 DIAGNOSIS — K449 Diaphragmatic hernia without obstruction or gangrene: Secondary | ICD-10-CM | POA: Diagnosis not present

## 2015-03-12 DIAGNOSIS — R9389 Abnormal findings on diagnostic imaging of other specified body structures: Secondary | ICD-10-CM

## 2015-03-12 DIAGNOSIS — J439 Emphysema, unspecified: Secondary | ICD-10-CM | POA: Insufficient documentation

## 2015-03-12 DIAGNOSIS — I2510 Atherosclerotic heart disease of native coronary artery without angina pectoris: Secondary | ICD-10-CM | POA: Insufficient documentation

## 2015-03-12 DIAGNOSIS — R938 Abnormal findings on diagnostic imaging of other specified body structures: Secondary | ICD-10-CM | POA: Diagnosis present

## 2015-03-12 DIAGNOSIS — J984 Other disorders of lung: Secondary | ICD-10-CM | POA: Insufficient documentation

## 2015-06-13 ENCOUNTER — Encounter (INDEPENDENT_AMBULATORY_CARE_PROVIDER_SITE_OTHER): Payer: Self-pay

## 2015-06-13 ENCOUNTER — Ambulatory Visit: Payer: Medicare Other

## 2015-10-01 DIAGNOSIS — Z1329 Encounter for screening for other suspected endocrine disorder: Secondary | ICD-10-CM | POA: Diagnosis not present

## 2015-10-01 DIAGNOSIS — I671 Cerebral aneurysm, nonruptured: Secondary | ICD-10-CM | POA: Diagnosis not present

## 2015-10-01 DIAGNOSIS — E559 Vitamin D deficiency, unspecified: Secondary | ICD-10-CM | POA: Diagnosis not present

## 2015-10-01 DIAGNOSIS — I739 Peripheral vascular disease, unspecified: Secondary | ICD-10-CM | POA: Diagnosis not present

## 2015-10-01 DIAGNOSIS — J449 Chronic obstructive pulmonary disease, unspecified: Secondary | ICD-10-CM | POA: Diagnosis not present

## 2015-10-01 DIAGNOSIS — Z79899 Other long term (current) drug therapy: Secondary | ICD-10-CM | POA: Diagnosis not present

## 2015-10-01 DIAGNOSIS — F325 Major depressive disorder, single episode, in full remission: Secondary | ICD-10-CM | POA: Diagnosis not present

## 2015-10-01 DIAGNOSIS — J431 Panlobular emphysema: Secondary | ICD-10-CM | POA: Diagnosis not present

## 2015-10-01 DIAGNOSIS — E7800 Pure hypercholesterolemia, unspecified: Secondary | ICD-10-CM | POA: Diagnosis not present

## 2015-10-01 DIAGNOSIS — I729 Aneurysm of unspecified site: Secondary | ICD-10-CM | POA: Diagnosis not present

## 2015-10-02 DIAGNOSIS — R52 Pain, unspecified: Secondary | ICD-10-CM | POA: Diagnosis not present

## 2015-10-02 DIAGNOSIS — Z79899 Other long term (current) drug therapy: Secondary | ICD-10-CM | POA: Diagnosis not present

## 2015-10-02 DIAGNOSIS — F325 Major depressive disorder, single episode, in full remission: Secondary | ICD-10-CM | POA: Diagnosis not present

## 2015-10-02 DIAGNOSIS — I729 Aneurysm of unspecified site: Secondary | ICD-10-CM | POA: Diagnosis not present

## 2015-10-02 DIAGNOSIS — E559 Vitamin D deficiency, unspecified: Secondary | ICD-10-CM | POA: Diagnosis not present

## 2015-10-02 DIAGNOSIS — E7800 Pure hypercholesterolemia, unspecified: Secondary | ICD-10-CM | POA: Diagnosis not present

## 2015-10-02 DIAGNOSIS — J431 Panlobular emphysema: Secondary | ICD-10-CM | POA: Diagnosis not present

## 2015-10-02 DIAGNOSIS — Z1329 Encounter for screening for other suspected endocrine disorder: Secondary | ICD-10-CM | POA: Diagnosis not present

## 2015-10-02 DIAGNOSIS — I739 Peripheral vascular disease, unspecified: Secondary | ICD-10-CM | POA: Diagnosis not present

## 2015-10-02 DIAGNOSIS — I671 Cerebral aneurysm, nonruptured: Secondary | ICD-10-CM | POA: Diagnosis not present

## 2015-11-26 DIAGNOSIS — M79675 Pain in left toe(s): Secondary | ICD-10-CM | POA: Diagnosis not present

## 2015-11-26 DIAGNOSIS — M79674 Pain in right toe(s): Secondary | ICD-10-CM | POA: Diagnosis not present

## 2015-11-26 DIAGNOSIS — B351 Tinea unguium: Secondary | ICD-10-CM | POA: Diagnosis not present

## 2015-12-10 DIAGNOSIS — J449 Chronic obstructive pulmonary disease, unspecified: Secondary | ICD-10-CM | POA: Diagnosis not present

## 2015-12-10 DIAGNOSIS — Z23 Encounter for immunization: Secondary | ICD-10-CM | POA: Diagnosis not present

## 2015-12-10 DIAGNOSIS — I714 Abdominal aortic aneurysm, without rupture: Secondary | ICD-10-CM | POA: Diagnosis not present

## 2015-12-10 DIAGNOSIS — Z79899 Other long term (current) drug therapy: Secondary | ICD-10-CM | POA: Diagnosis not present

## 2015-12-10 DIAGNOSIS — Z87891 Personal history of nicotine dependence: Secondary | ICD-10-CM | POA: Diagnosis not present

## 2015-12-10 DIAGNOSIS — I739 Peripheral vascular disease, unspecified: Secondary | ICD-10-CM | POA: Diagnosis not present

## 2015-12-29 DIAGNOSIS — E7800 Pure hypercholesterolemia, unspecified: Secondary | ICD-10-CM | POA: Diagnosis not present

## 2015-12-29 DIAGNOSIS — R42 Dizziness and giddiness: Secondary | ICD-10-CM | POA: Diagnosis not present

## 2015-12-29 DIAGNOSIS — Z79899 Other long term (current) drug therapy: Secondary | ICD-10-CM | POA: Diagnosis not present

## 2015-12-29 DIAGNOSIS — I10 Essential (primary) hypertension: Secondary | ICD-10-CM | POA: Diagnosis not present

## 2015-12-29 DIAGNOSIS — I739 Peripheral vascular disease, unspecified: Secondary | ICD-10-CM | POA: Diagnosis not present

## 2015-12-29 DIAGNOSIS — J431 Panlobular emphysema: Secondary | ICD-10-CM | POA: Diagnosis not present

## 2015-12-29 DIAGNOSIS — N390 Urinary tract infection, site not specified: Secondary | ICD-10-CM | POA: Diagnosis not present

## 2016-03-30 DIAGNOSIS — E559 Vitamin D deficiency, unspecified: Secondary | ICD-10-CM | POA: Diagnosis not present

## 2016-03-30 DIAGNOSIS — Z79899 Other long term (current) drug therapy: Secondary | ICD-10-CM | POA: Diagnosis not present

## 2016-03-30 DIAGNOSIS — J449 Chronic obstructive pulmonary disease, unspecified: Secondary | ICD-10-CM | POA: Diagnosis not present

## 2016-03-30 DIAGNOSIS — E538 Deficiency of other specified B group vitamins: Secondary | ICD-10-CM | POA: Diagnosis not present

## 2016-03-30 DIAGNOSIS — I10 Essential (primary) hypertension: Secondary | ICD-10-CM | POA: Diagnosis not present

## 2016-03-30 DIAGNOSIS — J431 Panlobular emphysema: Secondary | ICD-10-CM | POA: Diagnosis not present

## 2016-03-30 DIAGNOSIS — I739 Peripheral vascular disease, unspecified: Secondary | ICD-10-CM | POA: Diagnosis not present

## 2016-03-30 DIAGNOSIS — Z1239 Encounter for other screening for malignant neoplasm of breast: Secondary | ICD-10-CM | POA: Diagnosis not present

## 2016-03-30 DIAGNOSIS — E7800 Pure hypercholesterolemia, unspecified: Secondary | ICD-10-CM | POA: Diagnosis not present

## 2016-04-09 DIAGNOSIS — H2513 Age-related nuclear cataract, bilateral: Secondary | ICD-10-CM | POA: Diagnosis not present

## 2016-05-17 ENCOUNTER — Other Ambulatory Visit: Payer: Self-pay | Admitting: Internal Medicine

## 2016-06-07 DIAGNOSIS — H2513 Age-related nuclear cataract, bilateral: Secondary | ICD-10-CM | POA: Diagnosis not present

## 2016-06-17 ENCOUNTER — Encounter: Admission: RE | Payer: Self-pay | Source: Ambulatory Visit

## 2016-06-17 ENCOUNTER — Ambulatory Visit
Admission: RE | Admit: 2016-06-17 | Payer: Commercial Managed Care - HMO | Source: Ambulatory Visit | Admitting: Ophthalmology

## 2016-06-17 HISTORY — DX: Chronic obstructive pulmonary disease, unspecified: J44.9

## 2016-06-17 HISTORY — DX: Pure hypercholesterolemia, unspecified: E78.00

## 2016-06-17 HISTORY — DX: Essential (primary) hypertension: I10

## 2016-06-17 HISTORY — DX: Cerebral infarction, unspecified: I63.9

## 2016-06-17 HISTORY — DX: Dyspnea, unspecified: R06.00

## 2016-06-17 HISTORY — DX: Cerebral aneurysm, nonruptured: I67.1

## 2016-06-17 HISTORY — DX: Age-related osteoporosis without current pathological fracture: M81.0

## 2016-06-17 SURGERY — PHACOEMULSIFICATION, CATARACT, WITH IOL INSERTION
Anesthesia: Choice | Laterality: Left

## 2016-06-28 DIAGNOSIS — Z79899 Other long term (current) drug therapy: Secondary | ICD-10-CM | POA: Diagnosis not present

## 2016-06-28 DIAGNOSIS — J449 Chronic obstructive pulmonary disease, unspecified: Secondary | ICD-10-CM | POA: Diagnosis not present

## 2016-06-28 DIAGNOSIS — I739 Peripheral vascular disease, unspecified: Secondary | ICD-10-CM | POA: Diagnosis not present

## 2016-06-28 DIAGNOSIS — I714 Abdominal aortic aneurysm, without rupture: Secondary | ICD-10-CM | POA: Diagnosis not present

## 2016-06-28 DIAGNOSIS — I10 Essential (primary) hypertension: Secondary | ICD-10-CM | POA: Diagnosis not present

## 2016-07-05 DIAGNOSIS — F325 Major depressive disorder, single episode, in full remission: Secondary | ICD-10-CM | POA: Diagnosis not present

## 2016-07-05 DIAGNOSIS — F5104 Psychophysiologic insomnia: Secondary | ICD-10-CM | POA: Diagnosis not present

## 2016-07-05 DIAGNOSIS — I10 Essential (primary) hypertension: Secondary | ICD-10-CM | POA: Diagnosis not present

## 2016-07-05 DIAGNOSIS — J431 Panlobular emphysema: Secondary | ICD-10-CM | POA: Diagnosis not present

## 2016-07-05 DIAGNOSIS — Z79899 Other long term (current) drug therapy: Secondary | ICD-10-CM | POA: Diagnosis not present

## 2016-07-05 DIAGNOSIS — E7800 Pure hypercholesterolemia, unspecified: Secondary | ICD-10-CM | POA: Diagnosis not present

## 2016-07-06 ENCOUNTER — Encounter: Payer: Self-pay | Admitting: *Deleted

## 2016-07-08 ENCOUNTER — Ambulatory Visit: Payer: Commercial Managed Care - HMO | Admitting: Anesthesiology

## 2016-07-08 ENCOUNTER — Encounter
Admission: RE | Disposition: A | Discharge status: Home / Self Care | Payer: Self-pay | Source: Ambulatory Visit | Attending: Ophthalmology

## 2016-07-08 ENCOUNTER — Ambulatory Visit
Admission: RE | Admit: 2016-07-08 | Discharge: 2016-07-08 | Disposition: A | Discharge status: Home / Self Care | Payer: Commercial Managed Care - HMO | Source: Ambulatory Visit | Attending: Ophthalmology | Admitting: Ophthalmology

## 2016-07-08 ENCOUNTER — Encounter: Payer: Self-pay | Admitting: *Deleted

## 2016-07-08 DIAGNOSIS — M810 Age-related osteoporosis without current pathological fracture: Secondary | ICD-10-CM | POA: Insufficient documentation

## 2016-07-08 DIAGNOSIS — I10 Essential (primary) hypertension: Secondary | ICD-10-CM | POA: Insufficient documentation

## 2016-07-08 DIAGNOSIS — E7800 Pure hypercholesterolemia, unspecified: Secondary | ICD-10-CM | POA: Insufficient documentation

## 2016-07-08 DIAGNOSIS — J449 Chronic obstructive pulmonary disease, unspecified: Secondary | ICD-10-CM | POA: Diagnosis not present

## 2016-07-08 DIAGNOSIS — I714 Abdominal aortic aneurysm, without rupture: Secondary | ICD-10-CM | POA: Diagnosis not present

## 2016-07-08 DIAGNOSIS — H2512 Age-related nuclear cataract, left eye: Secondary | ICD-10-CM | POA: Insufficient documentation

## 2016-07-08 DIAGNOSIS — Z955 Presence of coronary angioplasty implant and graft: Secondary | ICD-10-CM | POA: Diagnosis not present

## 2016-07-08 DIAGNOSIS — Z87891 Personal history of nicotine dependence: Secondary | ICD-10-CM | POA: Insufficient documentation

## 2016-07-08 DIAGNOSIS — Z79899 Other long term (current) drug therapy: Secondary | ICD-10-CM | POA: Diagnosis not present

## 2016-07-08 DIAGNOSIS — H2513 Age-related nuclear cataract, bilateral: Secondary | ICD-10-CM | POA: Diagnosis not present

## 2016-07-08 HISTORY — DX: Abdominal aortic aneurysm, without rupture: I71.4

## 2016-07-08 HISTORY — DX: Personal history of other specified conditions: Z87.898

## 2016-07-08 HISTORY — DX: Abdominal aortic aneurysm, without rupture, unspecified: I71.40

## 2016-07-08 HISTORY — PX: CATARACT EXTRACTION W/PHACO: SHX586

## 2016-07-08 SURGERY — PHACOEMULSIFICATION, CATARACT, WITH IOL INSERTION
Anesthesia: Monitor Anesthesia Care | Site: Eye | Laterality: Left | Wound class: Clean

## 2016-07-08 MED ORDER — SODIUM HYALURONATE 10 MG/ML IO SOLN
INTRAOCULAR | Status: AC
  Filled 2016-07-08: qty 0.85

## 2016-07-08 MED ORDER — MOXIFLOXACIN HCL 0.5 % OP SOLN: 1.0000 [drp] | OPHTHALMIC | Status: DC | PRN

## 2016-07-08 MED ORDER — ARMC OPHTHALMIC DILATING DROPS
OPHTHALMIC | Status: AC
  Administered 2016-07-08: 10:00:00
  Filled 2016-07-08: qty 0.4

## 2016-07-08 MED ORDER — SODIUM HYALURONATE 23 MG/ML IO SOLN
INTRAOCULAR | Status: AC
  Filled 2016-07-08: qty 0.6

## 2016-07-08 MED ORDER — BSS IO SOLN
INTRAOCULAR | Status: DC | PRN
  Administered 2016-07-08: 11:00:00 via OPHTHALMIC

## 2016-07-08 MED ORDER — MIDAZOLAM HCL 2 MG/2ML IJ SOLN
INTRAMUSCULAR | Status: DC | PRN
  Administered 2016-07-08: 1 mg via INTRAVENOUS

## 2016-07-08 MED ORDER — LIDOCAINE HCL (PF) 4 % IJ SOLN
INTRAMUSCULAR | Status: AC
  Filled 2016-07-08: qty 5

## 2016-07-08 MED ORDER — MOXIFLOXACIN HCL 0.5 % OP SOLN
OPHTHALMIC | Status: DC | PRN
  Administered 2016-07-08: 1 [drp] via OPHTHALMIC

## 2016-07-08 MED ORDER — FENTANYL CITRATE (PF) 100 MCG/2ML IJ SOLN
INTRAMUSCULAR | Status: DC | PRN
  Administered 2016-07-08: 50 ug via INTRAVENOUS

## 2016-07-08 MED ORDER — EPINEPHRINE PF 1 MG/ML IJ SOLN
INTRAMUSCULAR | Status: AC
  Filled 2016-07-08: qty 2

## 2016-07-08 MED ORDER — SODIUM CHLORIDE 0.9 % IV SOLN
INTRAVENOUS | Status: DC
  Administered 2016-07-08: 10:00:00 via INTRAVENOUS

## 2016-07-08 MED ORDER — POVIDONE-IODINE 5 % OP SOLN
OPHTHALMIC | Status: AC
  Filled 2016-07-08: qty 30

## 2016-07-08 MED ORDER — SODIUM HYALURONATE 23 MG/ML IO SOLN
INTRAOCULAR | Status: DC | PRN
  Administered 2016-07-08: 0.6 mL via INTRAOCULAR

## 2016-07-08 MED ORDER — LIDOCAINE HCL (PF) 4 % IJ SOLN
INTRAMUSCULAR | Status: DC | PRN
  Administered 2016-07-08: 4 mL via OPHTHALMIC

## 2016-07-08 MED ORDER — SODIUM HYALURONATE 10 MG/ML IO SOLN
INTRAOCULAR | Status: DC | PRN
  Administered 2016-07-08: 0.85 mL via INTRAOCULAR

## 2016-07-08 MED ORDER — CARBACHOL 0.01 % IO SOLN
INTRAOCULAR | Status: DC | PRN
  Administered 2016-07-08: 0.5 mL via INTRAOCULAR

## 2016-07-08 MED ORDER — ARMC OPHTHALMIC DILATING DROPS
1.0000 "application " | OPHTHALMIC | Status: AC
  Administered 2016-07-08 (×2): 1 via OPHTHALMIC

## 2016-07-08 MED ORDER — MOXIFLOXACIN HCL 0.5 % OP SOLN
OPHTHALMIC | Status: AC
  Filled 2016-07-08: qty 3

## 2016-07-08 SURGICAL SUPPLY — 23 items
CANNULA ANT/CHMB 27GA (MISCELLANEOUS) ×6 IMPLANT
CUP MEDICINE 2OZ PLAST GRAD ST (MISCELLANEOUS) ×3 IMPLANT
DISSECTOR HYDRO NUCLEUS 50X22 (MISCELLANEOUS) ×3 IMPLANT
GLOVE BIO SURGEON STRL SZ8 (GLOVE) ×3 IMPLANT
GLOVE BIOGEL M 6.5 STRL (GLOVE) ×3 IMPLANT
GLOVE SURG LX 7.5 STRW (GLOVE) ×2
GLOVE SURG LX STRL 7.5 STRW (GLOVE) ×1 IMPLANT
GOWN STRL REUS W/ TWL LRG LVL3 (GOWN DISPOSABLE) ×2 IMPLANT
GOWN STRL REUS W/TWL LRG LVL3 (GOWN DISPOSABLE) ×4
LENS IOL TECNIS ITEC 25.0 (Intraocular Lens) ×3 IMPLANT
NEEDLE CAPSULORHEX 25GA (NEEDLE) ×3 IMPLANT
PACK CATARACT (MISCELLANEOUS) ×3 IMPLANT
PACK CATARACT BRASINGTON LX (MISCELLANEOUS) ×3 IMPLANT
PACK EYE AFTER SURG (MISCELLANEOUS) ×3 IMPLANT
SOL BSS BAG (MISCELLANEOUS) ×3
SOL PREP PVP 2OZ (MISCELLANEOUS) ×3
SOLUTION BSS BAG (MISCELLANEOUS) ×1 IMPLANT
SOLUTION PREP PVP 2OZ (MISCELLANEOUS) ×1 IMPLANT
SYR 3ML LL SCALE MARK (SYRINGE) ×6 IMPLANT
SYR 5ML LL (SYRINGE) ×3 IMPLANT
SYR TB 1ML 27GX1/2 LL (SYRINGE) ×3 IMPLANT
WATER STERILE IRR 250ML POUR (IV SOLUTION) ×3 IMPLANT
WIPE NON LINTING 3.25X3.25 (MISCELLANEOUS) ×3 IMPLANT

## 2016-07-08 NOTE — Anesthesia Preprocedure Evaluation (Signed)
Anesthesia Evaluation  Patient identified by MRN, date of birth, ID band Patient awake    Reviewed: Allergy & Precautions, NPO status , Patient's Chart, lab work & pertinent test results  History of Anesthesia Complications Negative for: history of anesthetic complications  Airway Mallampati: III  TM Distance: >3 FB Neck ROM: Full    Dental  (+) Lower Dentures, Upper Dentures   Pulmonary neg sleep apnea, COPD,  COPD inhaler, former smoker,    breath sounds clear to auscultation- rhonchi (-) wheezing      Cardiovascular hypertension, Pt. on medications (-) CAD, (-) Past MI and (-) Cardiac Stents  Rhythm:Regular Rate:Normal - Systolic murmurs and - Diastolic murmurs    Neuro/Psych CVA, No Residual Symptoms negative psych ROS   GI/Hepatic negative GI ROS, Neg liver ROS,   Endo/Other  negative endocrine ROSneg diabetes  Renal/GU negative Renal ROS     Musculoskeletal negative musculoskeletal ROS (+)   Abdominal (+) - obese,   Peds  Hematology negative hematology ROS (+)   Anesthesia Other Findings Past Medical History: No date: AAA (abdominal aortic aneurysm) (HCC) No date: Abdominal aortic aneurysm (AAA) (HCC) No date: Brain aneurysm No date: COPD (chronic obstructive pulmonary disease) (* No date: Dyspnea No date: H/O wheezing No date: Hypercholesterolemia No date: Hypertension No date: Osteoporosis 2016: Stroke [facility](HCC)     Comment: twice   Reproductive/Obstetrics                             Anesthesia Physical Anesthesia Plan  ASA: III  Anesthesia Plan: MAC   Post-op Pain Management:    Induction: Intravenous  Airway Management Planned: Natural Airway  Additional Equipment:   Intra-op Plan:   Post-operative Plan:   Informed Consent: I have reviewed the patients History and Physical, chart, labs and discussed the procedure including the risks, benefits and alternatives  for the proposed anesthesia with the patient or authorized representative who has indicated his/her understanding and acceptance.   Dental advisory given  Plan Discussed with: CRNA and Anesthesiologist  Anesthesia Plan Comments:         Anesthesia Quick Evaluation

## 2016-07-08 NOTE — H&P (Signed)
The History and Physical notes are on paper, have been signed, and are to be scanned. The patient remains stable and unchanged from the H&P.   Previous H&P reviewed, patient examined, and there are no changes.  Donna BladeBradley Quinn 07/08/2016 10:48 AM

## 2016-07-08 NOTE — Op Note (Signed)
OPERATIVE NOTE  Donna Quinn [id number] 07/08/2016   PREOPERATIVE DIAGNOSIS:  Nuclear sclerotic cataract left eye.  H25.12   POSTOPERATIVE DIAGNOSIS:    Nuclear sclerotic cataract left eye.     PROCEDURE:  Phacoemusification with posterior chamber intraocular lens placement of the left eye   LENS:   Implant Name Type Inv. Item Serial No. Manufacturer Lot No. LRB No. Used  LENS IOL DIOP 25.0 - [id number] Intraocular Lens LENS IOL DIOP 25.0 [id number]   Left 1       PCB00 +25.0   ULTRASOUND TIME: 1 minutes 30 seconds.  CDE 20.3   SURGEON:  Donna BladeBradley Jorgeluis Gurganus, MD, MPH   ANESTHESIA:  Topical with tetracaine drops augmented with 1% preservative-free intracameral lidocaine.   COMPLICATIONS:  None.   DESCRIPTION OF PROCEDURE:  The patient was identified in the holding room and transported to the operating room and placed in the supine position under the operating microscope.  The left eye was identified as the operative eye and it was prepped and draped in the usual sterile ophthalmic fashion.   A 1.0 millimeter clear-corneal paracentesis was made at the 5:00 position. 0.5 ml of preservative-free 1% lidocaine with epinephrine was injected into the anterior chamber.  The anterior chamber was filled with Healon 5 viscoelastic.  A 2.4 millimeter keratome was used to make a near-clear corneal incision at the 2:00 position.  A curvilinear capsulorrhexis was made with a cystotome and capsulorrhexis forceps.  Balanced salt solution was used to hydrodissect and hydrodelineate the nucleus.   Phacoemulsification was then used in stop and chop fashion to remove the lens nucleus and epinucleus.  The remaining cortex was then removed using the irrigation and aspiration handpiece. Healon was then placed into the capsular bag to distend it for lens placement.  A lens was then injected into the capsular bag.  The remaining viscoelastic was aspirated.   Wounds were hydrated with balanced salt solution.   The anterior chamber was inflated to a physiologic pressure with balanced salt solution.   Intracameral vigamox 0.1 mL undiltued was injected into the eye and a drop placed onto the ocular surface.  The lens was very dense.  It required a significant amount of phaco energy despite many chopping maneuvers.   No wound leaks were noted.  The patient was taken to the recovery room in stable condition without complications of anesthesia or surgery  Donna Quinn 07/08/2016, 11:26 AM

## 2016-07-08 NOTE — Anesthesia Postprocedure Evaluation (Signed)
Anesthesia Post Note  Patient: Donna Quinn  Procedure(s) Performed: Procedure(s) (LRB): CATARACT EXTRACTION PHACO AND INTRAOCULAR LENS PLACEMENT (IOC) (Left)  Patient location during evaluation: PACU Anesthesia Type: MAC Level of consciousness: awake and alert Pain management: pain level controlled Vital Signs Assessment: post-procedure vital signs reviewed and stable Respiratory status: spontaneous breathing, nonlabored ventilation and respiratory function stable Cardiovascular status: stable and blood pressure returned to baseline Anesthetic complications: no    Last Vitals:  Vitals:   07/08/16 0922 07/08/16 1127  BP: (!) 156/68 (!) 167/72  Pulse: 77 72  Resp: 16 20  Temp: 36.6 C     Last Pain:  Vitals:   07/08/16 1127  TempSrc: Temporal                 Marine Lezotte

## 2016-07-08 NOTE — Transfer of Care (Signed)
Immediate Anesthesia Transfer of Care Note  Patient: [name]  Procedure(s) Performed: Procedure(s) with comments: CATARACT EXTRACTION PHACO AND INTRAOCULAR LENS PLACEMENT (IOC) (Left) - US  01:30 AP% 39.1 CDE 20.3 Fluid pack lot # [id number]  Patient Location: PACU  Anesthesia Type:MAC  Level of Consciousness: awake, alert  and oriented  Airway & Oxygen Therapy: Patient Spontanous Breathing  Post-op Assessment: Report given to RN and Post -op Vital signs reviewed and stable  Post vital signs: Reviewed and stable  Last Vitals:  Vitals:   07/08/16 0922 07/08/16 1127  BP: (!) 156/68 (!) 167/72  Pulse: 77 72  Resp: 16 20  Temp: 36.6 C     Last Pain:  Vitals:   07/08/16 1127  TempSrc: Temporal         Complications: No apparent anesthesia complications

## 2016-07-08 NOTE — Discharge Instructions (Signed)
Eye Surgery Discharge Instructions  Expect mild scratchy sensation or mild soreness. DO NOT RUB YOUR EYE!  The day of surgery: . Minimal physical activity, but bed rest is not required . No reading, computer work, or close hand work . No bending, lifting, or straining. . May watch TV  For 24 hours: . No driving, legal decisions, or alcoholic beverages . Safety precautions . Eat anything you prefer: It is better to start with liquids, then soup then solid foods. . _____ Eye patch should be worn until postoperative exam tomorrow. . ____ Solar shield eyeglasses should be worn for comfort in the sunlight/patch while sleeping  Resume all regular medications including aspirin or Coumadin if these were discontinued prior to surgery. You may shower, bathe, shave, or wash your hair. Tylenol may be taken for mild discomfort.  Call your doctor if you experience significant pain, nausea, or vomiting, fever > 101 or other signs of infection. [phone] or [phone] Specific instructions:  Follow-up Information    Willey BladeBradley King, MD Follow up on 07/09/2016.   Specialty:  Ophthalmology Why:  at 10:05 am Contact information: [address] [place] [address] [phone]

## 2016-08-03 DIAGNOSIS — J441 Chronic obstructive pulmonary disease with (acute) exacerbation: Secondary | ICD-10-CM | POA: Diagnosis not present

## 2016-08-06 ENCOUNTER — Emergency Department: Payer: PRIVATE HEALTH INSURANCE

## 2016-08-06 ENCOUNTER — Encounter: Payer: Self-pay | Admitting: Radiology

## 2016-08-06 ENCOUNTER — Inpatient Hospital Stay
Admission: EM | Admit: 2016-08-06 | Discharge: 2016-08-08 | DRG: 189 | Disposition: A | Discharge status: Home / Self Care | Payer: PRIVATE HEALTH INSURANCE | Attending: Internal Medicine | Admitting: Internal Medicine

## 2016-08-06 DIAGNOSIS — J9601 Acute respiratory failure with hypoxia: Secondary | ICD-10-CM | POA: Diagnosis not present

## 2016-08-06 DIAGNOSIS — Z87891 Personal history of nicotine dependence: Secondary | ICD-10-CM | POA: Diagnosis not present

## 2016-08-06 DIAGNOSIS — M810 Age-related osteoporosis without current pathological fracture: Secondary | ICD-10-CM | POA: Diagnosis not present

## 2016-08-06 DIAGNOSIS — Z9842 Cataract extraction status, left eye: Secondary | ICD-10-CM

## 2016-08-06 DIAGNOSIS — J441 Chronic obstructive pulmonary disease with (acute) exacerbation: Secondary | ICD-10-CM | POA: Diagnosis not present

## 2016-08-06 DIAGNOSIS — Z882 Allergy status to sulfonamides status: Secondary | ICD-10-CM | POA: Diagnosis not present

## 2016-08-06 DIAGNOSIS — Z803 Family history of malignant neoplasm of breast: Secondary | ICD-10-CM | POA: Diagnosis not present

## 2016-08-06 DIAGNOSIS — Z888 Allergy status to other drugs, medicaments and biological substances status: Secondary | ICD-10-CM

## 2016-08-06 DIAGNOSIS — Z961 Presence of intraocular lens: Secondary | ICD-10-CM | POA: Diagnosis not present

## 2016-08-06 DIAGNOSIS — Z9889 Other specified postprocedural states: Secondary | ICD-10-CM

## 2016-08-06 DIAGNOSIS — N183 Chronic kidney disease, stage 3 (moderate): Secondary | ICD-10-CM | POA: Diagnosis present

## 2016-08-06 DIAGNOSIS — I10 Essential (primary) hypertension: Secondary | ICD-10-CM | POA: Diagnosis not present

## 2016-08-06 DIAGNOSIS — Z880 Allergy status to penicillin: Secondary | ICD-10-CM

## 2016-08-06 DIAGNOSIS — E871 Hypo-osmolality and hyponatremia: Secondary | ICD-10-CM | POA: Diagnosis present

## 2016-08-06 DIAGNOSIS — Z8673 Personal history of transient ischemic attack (TIA), and cerebral infarction without residual deficits: Secondary | ICD-10-CM

## 2016-08-06 DIAGNOSIS — I129 Hypertensive chronic kidney disease with stage 1 through stage 4 chronic kidney disease, or unspecified chronic kidney disease: Secondary | ICD-10-CM | POA: Diagnosis not present

## 2016-08-06 DIAGNOSIS — R0602 Shortness of breath: Secondary | ICD-10-CM | POA: Diagnosis not present

## 2016-08-06 HISTORY — DX: Systemic involvement of connective tissue, unspecified: M35.9

## 2016-08-06 LAB — CBC WITH DIFFERENTIAL/PLATELET
BASOS ABS: 0 10*3/uL (ref 0–0.1)
BASOS PCT: 0 %
EOS PCT: 0 %
Eosinophils Absolute: 0 10*3/uL (ref 0–0.7)
HCT: 34.5 % — ABNORMAL LOW (ref 35.0–47.0)
Hemoglobin: 11.9 g/dL — ABNORMAL LOW (ref 12.0–16.0)
LYMPHS PCT: 2 %
Lymphs Abs: 0.2 10*3/uL — ABNORMAL LOW (ref 1.0–3.6)
MCH: 29.1 pg (ref 26.0–34.0)
MCHC: 34.5 g/dL (ref 32.0–36.0)
MCV: 84.6 fL (ref 80.0–100.0)
MONO ABS: 0.2 10*3/uL (ref 0.2–0.9)
Monocytes Relative: 2 %
NEUTROS ABS: 7.8 10*3/uL — AB (ref 1.4–6.5)
Neutrophils Relative %: 96 %
PLATELETS: 245 10*3/uL (ref 150–440)
RBC: 4.08 MIL/uL (ref 3.80–5.20)
RDW: 14.5 % (ref 11.5–14.5)
WBC: 8.2 10*3/uL (ref 3.6–11.0)

## 2016-08-06 LAB — COMPREHENSIVE METABOLIC PANEL
ALK PHOS: 58 U/L (ref 38–126)
ALT: 19 U/L (ref 14–54)
ANION GAP: 10 (ref 5–15)
AST: 28 U/L (ref 15–41)
Albumin: 4.1 g/dL (ref 3.5–5.0)
BILIRUBIN TOTAL: 0.5 mg/dL (ref 0.3–1.2)
BUN: 21 mg/dL — AB (ref 6–20)
CALCIUM: 8.8 mg/dL — AB (ref 8.9–10.3)
CO2: 25 mmol/L (ref 22–32)
Chloride: 96 mmol/L — ABNORMAL LOW (ref 101–111)
Creatinine, Ser: 1.48 mg/dL — ABNORMAL HIGH (ref 0.44–1.00)
GFR calc Af Amer: 38 mL/min — ABNORMAL LOW (ref >60)
GFR, EST NON AFRICAN AMERICAN: 33 mL/min — AB (ref >60)
GLUCOSE: 202 mg/dL — AB (ref 65–99)
POTASSIUM: 4.1 mmol/L (ref 3.5–5.1)
Sodium: 131 mmol/L — ABNORMAL LOW (ref 135–145)
TOTAL PROTEIN: 7.3 g/dL (ref 6.5–8.1)

## 2016-08-06 LAB — TROPONIN I: TROPONIN I: 0.03 ng/mL — AB (ref <0.03)

## 2016-08-06 LAB — FIBRIN DERIVATIVES D-DIMER (ARMC ONLY): FIBRIN DERIVATIVES D-DIMER (ARMC): 1229 — AB (ref 0–499)

## 2016-08-06 LAB — BRAIN NATRIURETIC PEPTIDE: B NATRIURETIC PEPTIDE 5: 179 pg/mL — AB (ref 0.0–100.0)

## 2016-08-06 MED ORDER — GABAPENTIN 300 MG PO CAPS
300.0000 mg | ORAL_CAPSULE | Freq: Two times a day (BID) | ORAL | Status: DC
  Administered 2016-08-06 – 2016-08-08 (×4): 300 mg via ORAL
  Filled 2016-08-06 (×4): qty 1

## 2016-08-06 MED ORDER — CLOPIDOGREL BISULFATE 75 MG PO TABS
75.0000 mg | ORAL_TABLET | ORAL | Status: DC
  Administered 2016-08-07 – 2016-08-08 (×2): 75 mg via ORAL
  Filled 2016-08-06 (×2): qty 1

## 2016-08-06 MED ORDER — ALBUTEROL SULFATE (2.5 MG/3ML) 0.083% IN NEBU: 2.5000 mg | INHALATION_SOLUTION | RESPIRATORY_TRACT | Status: DC | PRN

## 2016-08-06 MED ORDER — AZITHROMYCIN 500 MG PO TABS
500.0000 mg | ORAL_TABLET | Freq: Every day | ORAL | Status: AC
  Administered 2016-08-06: 500 mg via ORAL
  Filled 2016-08-06: qty 1

## 2016-08-06 MED ORDER — SODIUM CHLORIDE 0.9% FLUSH: 3.0000 mL | INTRAVENOUS | Status: DC | PRN

## 2016-08-06 MED ORDER — IOPAMIDOL (ISOVUE-370) INJECTION 76%
60.0000 mL | Freq: Once | INTRAVENOUS | Status: AC | PRN
  Administered 2016-08-06: 60 mL via INTRAVENOUS

## 2016-08-06 MED ORDER — ONDANSETRON HCL 4 MG/2ML IJ SOLN: 4.0000 mg | Freq: Four times a day (QID) | INTRAMUSCULAR | Status: DC | PRN

## 2016-08-06 MED ORDER — ATORVASTATIN CALCIUM 20 MG PO TABS
40.0000 mg | ORAL_TABLET | Freq: Every evening | ORAL | Status: DC
  Administered 2016-08-06 – 2016-08-07 (×2): 40 mg via ORAL
  Filled 2016-08-06 (×2): qty 2
  Filled 2016-08-06: qty 1

## 2016-08-06 MED ORDER — SODIUM CHLORIDE 0.9 % IV SOLN: 250.0000 mL | INTRAVENOUS | Status: DC | PRN

## 2016-08-06 MED ORDER — ALBUTEROL SULFATE (2.5 MG/3ML) 0.083% IN NEBU
5.0000 mg | INHALATION_SOLUTION | Freq: Once | RESPIRATORY_TRACT | Status: AC
  Administered 2016-08-06: 5 mg via RESPIRATORY_TRACT
  Filled 2016-08-06: qty 6

## 2016-08-06 MED ORDER — ENOXAPARIN SODIUM 40 MG/0.4ML ~~LOC~~ SOLN
SUBCUTANEOUS | Status: AC
  Administered 2016-08-06: 23:00:00 30 mg via SUBCUTANEOUS
  Filled 2016-08-06: qty 0.4

## 2016-08-06 MED ORDER — PANTOPRAZOLE SODIUM 40 MG PO TBEC
40.0000 mg | DELAYED_RELEASE_TABLET | Freq: Every day | ORAL | Status: DC
  Administered 2016-08-07 – 2016-08-08 (×2): 40 mg via ORAL
  Filled 2016-08-06 (×2): qty 1

## 2016-08-06 MED ORDER — POLYETHYLENE GLYCOL 3350 17 G PO PACK
17.0000 g | PACK | Freq: Every day | ORAL | Status: DC | PRN
  Administered 2016-08-08: 09:00:00 17 g via ORAL
  Filled 2016-08-06: qty 1

## 2016-08-06 MED ORDER — ATORVASTATIN CALCIUM 20 MG PO TABS
ORAL_TABLET | ORAL | Status: AC
  Administered 2016-08-06: 40 mg via ORAL
  Filled 2016-08-06: qty 2

## 2016-08-06 MED ORDER — ACETAMINOPHEN 650 MG RE SUPP: 650.0000 mg | Freq: Four times a day (QID) | RECTAL | Status: DC | PRN

## 2016-08-06 MED ORDER — LOSARTAN POTASSIUM 50 MG PO TABS
50.0000 mg | ORAL_TABLET | Freq: Every day | ORAL | Status: DC
  Administered 2016-08-07 – 2016-08-08 (×2): 50 mg via ORAL
  Filled 2016-08-06 (×2): qty 1

## 2016-08-06 MED ORDER — AZITHROMYCIN 500 MG PO TABS
ORAL_TABLET | ORAL | Status: AC
  Administered 2016-08-06: 500 mg via ORAL
  Filled 2016-08-06: qty 1

## 2016-08-06 MED ORDER — HYDRALAZINE HCL 20 MG/ML IJ SOLN
10.0000 mg | Freq: Four times a day (QID) | INTRAMUSCULAR | Status: DC | PRN
Start: 2016-08-06 — End: 2016-08-08

## 2016-08-06 MED ORDER — METHYLPREDNISOLONE SODIUM SUCC 125 MG IJ SOLR
80.0000 mg | Freq: Once | INTRAMUSCULAR | Status: AC
  Administered 2016-08-06: 80 mg via INTRAVENOUS
  Filled 2016-08-06: qty 2

## 2016-08-06 MED ORDER — HYDRALAZINE HCL 50 MG PO TABS
25.0000 mg | ORAL_TABLET | Freq: Two times a day (BID) | ORAL | Status: DC
  Administered 2016-08-06 – 2016-08-08 (×4): 25 mg via ORAL
  Filled 2016-08-06: qty 1
  Filled 2016-08-06: qty 2
  Filled 2016-08-06 (×2): qty 1

## 2016-08-06 MED ORDER — AZITHROMYCIN 250 MG PO TABS
250.0000 mg | ORAL_TABLET | Freq: Every day | ORAL | Status: DC
  Administered 2016-08-07 – 2016-08-08 (×2): 250 mg via ORAL
  Filled 2016-08-06 (×2): qty 1

## 2016-08-06 MED ORDER — MOMETASONE FURO-FORMOTEROL FUM 200-5 MCG/ACT IN AERO
2.0000 | INHALATION_SPRAY | Freq: Two times a day (BID) | RESPIRATORY_TRACT | Status: DC
  Administered 2016-08-06 – 2016-08-08 (×4): 2 via RESPIRATORY_TRACT
  Filled 2016-08-06: qty 8.8

## 2016-08-06 MED ORDER — PANTOPRAZOLE SODIUM 40 MG PO TBEC
DELAYED_RELEASE_TABLET | ORAL | Status: AC
  Filled 2016-08-06: qty 1

## 2016-08-06 MED ORDER — ENOXAPARIN SODIUM 30 MG/0.3ML ~~LOC~~ SOLN
30.0000 mg | SUBCUTANEOUS | Status: DC
  Administered 2016-08-07: 22:00:00 30 mg via SUBCUTANEOUS
  Filled 2016-08-06 (×2): qty 0.3

## 2016-08-06 MED ORDER — ONDANSETRON HCL 4 MG PO TABS: 4.0000 mg | ORAL_TABLET | Freq: Four times a day (QID) | ORAL | Status: DC | PRN

## 2016-08-06 MED ORDER — SODIUM CHLORIDE 0.9% FLUSH
3.0000 mL | Freq: Two times a day (BID) | INTRAVENOUS | Status: DC
  Administered 2016-08-06 – 2016-08-08 (×4): 3 mL via INTRAVENOUS

## 2016-08-06 MED ORDER — IPRATROPIUM-ALBUTEROL 0.5-2.5 (3) MG/3ML IN SOLN
3.0000 mL | Freq: Once | RESPIRATORY_TRACT | Status: AC
  Administered 2016-08-06: 3 mL via RESPIRATORY_TRACT
  Filled 2016-08-06: qty 3

## 2016-08-06 MED ORDER — ACETAMINOPHEN 325 MG PO TABS: 650.0000 mg | ORAL_TABLET | Freq: Four times a day (QID) | ORAL | Status: DC | PRN

## 2016-08-06 MED ORDER — METHYLPREDNISOLONE SODIUM SUCC 125 MG IJ SOLR
60.0000 mg | INTRAMUSCULAR | Status: DC
  Administered 2016-08-07: 60 mg via INTRAVENOUS
  Filled 2016-08-06: qty 2

## 2016-08-06 MED ORDER — LOSARTAN POTASSIUM 50 MG PO TABS
ORAL_TABLET | ORAL | Status: AC
  Filled 2016-08-06: qty 1

## 2016-08-06 NOTE — ED Notes (Signed)
Patient transported to CT 

## 2016-08-06 NOTE — ED Notes (Signed)
Admitting MD at bedside with patient and family members.

## 2016-08-06 NOTE — H&P (Signed)
[employer] - [place] at [facility]   PATIENT NAME: Donna Quinn    MR#:  [id number]  DATE OF BIRTH:  September 09, 1935  DATE OF ADMISSION:  08/06/2016  PRIMARY CARE PHYSICIAN: SPARKS,JEFFREY D, MD   REQUESTING/REFERRING PHYSICIAN: Dr. Lenard Lance  CHIEF COMPLAINT:   Chief Complaint  Patient presents with  . Shortness of Breath    HISTORY OF PRESENT ILLNESS:  Donna Quinn  is a 80 y.o. female with a known history of COPD, hypertension, CVA, abdominal aortic aneurysm not on home oxygen presents to the emergency room complaining of shortness of breath, cough, wheezing. Her insurance that she always has shortness of breath but this is worse. She was seen at [facility] clinic on 08/03/2016 and started on Levaquin and prednisone. With her symptoms worsening she went back to the clinic today and was referred to the emergency room. Here she has received IV steroids, nebulizers in spite of which she has significant expiratory wheezing, shortness of breath needing 2 L suctioned to keep her sats more than 90%. No sick contacts. She received a flu vaccine this season.  D-dimer was checked which was elevated and the CTA of the chest was done which showed no pulmonary embolism or infiltrates.   PAST MEDICAL HISTORY:   Past Medical History:  Diagnosis Date  . AAA (abdominal aortic aneurysm) (HCC)   . Abdominal aortic aneurysm (AAA) (HCC)   . Brain aneurysm   . Collagen vascular disease (HCC)   . COPD (chronic obstructive pulmonary disease) (HCC)   . Dyspnea   . H/O wheezing   . Hypercholesterolemia   . Hypertension   . Osteoporosis   . Stroke [facility]) 2016   twice    PAST SURGICAL HISTORY:   Past Surgical History:  Procedure Laterality Date  . abdominal Bilateral    stents  . BACK SURGERY     cyst removed from back  . BRAIN SURGERY     Brain aneurysm repair, coiling  . CAROTID ENDARTERECTOMY    . CATARACT EXTRACTION W/PHACO Left 07/08/2016   Procedure: CATARACT  EXTRACTION PHACO AND INTRAOCULAR LENS PLACEMENT (IOC);  Surgeon: Nevada Crane, MD;  Location: [facility] ORS;  Service: Ophthalmology;  Laterality: Left;  [place]  01:30 AP% 39.1 CDE 20.3 Fluid pack lot # [id number]    SOCIAL HISTORY:   Social History  Substance Use Topics  . Smoking status: Former Games developer  . Smokeless tobacco: Never Used  . Alcohol use No    FAMILY HISTORY:   Family History  Problem Relation Age of Onset  . Breast cancer Sister 70    DRUG ALLERGIES:   Allergies  Allergen Reactions  . Aggrenox [Aspirin-Dipyridamole Er]     A really bad headache  . Augmentin [Amoxicillin-Pot Clavulanate]   . Penicillins Other (See Comments)    Has patient had a PCN reaction causing immediate rash, facial/tongue/throat swelling, SOB or lightheadedness with hypotension: Yes Has patient had a PCN reaction causing severe rash involving mucus membranes or skin necrosis: Yes Has patient had a PCN reaction that required hospitalization No Has patient had a PCN reaction occurring within the last 10 years: No If all of the above answers are "NO", then may proceed with Cephalosporin use.  . Remeron [Mirtazapine]     Broke out inside the mouth  . Sulfa Antibiotics     Broke out in welps and had to be hospitalized    REVIEW OF SYSTEMS:   Review of Systems  Constitutional: Positive for malaise/fatigue. Negative for chills,  fever and weight loss.  HENT: Negative for hearing loss and nosebleeds.   Eyes: Negative for blurred vision, double vision and pain.  Respiratory: Positive for cough, shortness of breath and wheezing. Negative for hemoptysis.   Cardiovascular: Negative for chest pain, palpitations, orthopnea and leg swelling.  Gastrointestinal: Negative for abdominal pain, constipation, diarrhea, nausea and vomiting.  Genitourinary: Negative for dysuria and hematuria.  Musculoskeletal: Negative for back pain, falls and myalgias.  Skin: Negative for rash.  Neurological: Positive for  weakness. Negative for dizziness, tremors, sensory change, speech change, focal weakness, seizures and headaches.  Endo/Heme/Allergies: Does not bruise/bleed easily.  Psychiatric/Behavioral: Negative for depression and memory loss. The patient is not nervous/anxious.     MEDICATIONS AT HOME:   Prior to Admission medications   Medication Sig Start Date End Date Taking? Authorizing Provider  albuterol (PROVENTIL HFA;VENTOLIN HFA) 108 (90 Base) MCG/ACT inhaler Inhale 2 puffs into the lungs every 6 (six) hours as needed for wheezing or shortness of breath.   Yes Historical Provider, MD  atorvastatin (LIPITOR) 40 MG tablet Take 40 mg by mouth every evening.    Yes Historical Provider, MD  budesonide-formoterol (SYMBICORT) 160-4.5 MCG/ACT inhaler Inhale 2 puffs into the lungs 2 (two) times daily.   Yes Historical Provider, MD  Cholecalciferol 2000 units CAPS Take 1 capsule by mouth every morning.   Yes Historical Provider, MD  clopidogrel (PLAVIX) 75 MG tablet Take 75 mg by mouth every morning.   Yes Historical Provider, MD  gabapentin (NEURONTIN) 300 MG capsule Take 300 mg by mouth 2 (two) times daily.   Yes Historical Provider, MD  hydrALAZINE (APRESOLINE) 25 MG tablet Take 25 mg by mouth 2 (two) times daily.    Yes Historical Provider, MD  levofloxacin (LEVAQUIN) 500 MG tablet Take 500 mg by mouth daily. 08/03/16 08/09/16 Yes Historical Provider, MD  losartan (COZAAR) 50 MG tablet Take 50 mg by mouth daily.    Yes Historical Provider, MD  omeprazole (PRILOSEC) 20 MG capsule Take 20 mg by mouth 2 (two) times daily before a meal.   Yes Historical Provider, MD  predniSONE (DELTASONE) 20 MG tablet Take 20 mg by mouth 2 (two) times daily. 08/03/16 08/07/16 Yes Historical Provider, MD  vitamin B-12 (CYANOCOBALAMIN) 1000 MCG tablet Take 1,000 mcg by mouth daily.   Yes Historical Provider, MD     VITAL SIGNS:  Blood pressure (!) 183/81, pulse 95, temperature 97.8 F (36.6 C), temperature source Oral,  resp. rate (!) 27, height 5\' 6"  (1.676 m), weight 63 kg (139 lb), SpO2 97 %.  PHYSICAL EXAMINATION:  Physical Exam  GENERAL:  80 y.o.-year-old patient lying in the bed with Respiratory distress EYES: Pupils equal, round, reactive to light and accommodation. No scleral icterus. Extraocular muscles intact.  HEENT: Head atraumatic, normocephalic. Oropharynx and nasopharynx clear. No oropharyngeal erythema, moist oral mucosa  NECK:  Supple, no jugular venous distention. No thyroid enlargement, no tenderness.  LUNGS: Bilateral wheezing and decreased air entry. CARDIOVASCULAR: S1, S2 normal. No murmurs, rubs, or gallops.  ABDOMEN: Soft, nontender, nondistended. Bowel sounds present. No organomegaly or mass.  EXTREMITIES: No pedal edema, cyanosis, or clubbing. + 2 pedal & radial pulses b/l.   NEUROLOGIC: Cranial nerves II through XII are intact. No focal Motor or sensory deficits appreciated b/l PSYCHIATRIC: The patient is alert and oriented x 3. Good affect.  SKIN: No obvious rash, lesion, or ulcer.   LABORATORY PANEL:   CBC  Recent Labs Lab 08/06/16 1348  WBC 8.2  HGB 11.9*  HCT 34.5*  PLT 245   ------------------------------------------------------------------------------------------------------------------  Chemistries   Recent Labs Lab 08/06/16 1348  NA 131*  K 4.1  CL 96*  CO2 25  GLUCOSE 202*  BUN 21*  CREATININE 1.48*  CALCIUM 8.8*  AST 28  ALT 19  ALKPHOS 58  BILITOT 0.5   ------------------------------------------------------------------------------------------------------------------  Cardiac Enzymes  Recent Labs Lab 08/06/16 1348  TROPONINI 0.03*   ------------------------------------------------------------------------------------------------------------------  RADIOLOGY:  Dg Chest 2 View  Result Date: 08/06/2016 CLINICAL DATA:  COPD, shortness of Breath EXAM: CHEST  2 VIEW COMPARISON:  03/12/2015 FINDINGS: Cardiomediastinal silhouette is stable.  Scarring in right costophrenic angle again noted. No infiltrate or pleural effusion. No pulmonary edema. Hyperinflation again noted. Osteopenia and mild degenerative changes thoracic spine. Moderate size hiatal hernia. IMPRESSION: Scarring in right costophrenic angle again noted. No infiltrate or pleural effusion. No pulmonary edema. Hyperinflation again noted. Osteopenia and mild degenerative changes thoracic spine. Moderate size hiatal hernia. Electronically Signed   By: Natasha MeadLiviu  Pop M.D.   On: 08/06/2016 14:24   Ct Angio Chest Pe W Or Wo Contrast  Result Date: 08/06/2016 CLINICAL DATA:  Short of breath. EXAM: CT ANGIOGRAPHY CHEST WITH CONTRAST TECHNIQUE: Multidetector CT imaging of the chest was performed using the standard protocol during bolus administration of intravenous contrast. Multiplanar CT image reconstructions and MIPs were obtained to evaluate the vascular anatomy. CONTRAST:  60 mL Isovue COMPARISON:  None. FINDINGS: Cardiovascular: No filling defects in the pulmonary arteries to suggest acute pulmonary embolism. Coronary artery calcification and aortic atherosclerotic calcification. No pericardial fluid. Mediastinum/Nodes: No axillary or supraclavicular adenopathy. No mediastinal hilar adenopathy. Esophagus is gas-filled. Lungs/Pleura: Mild centrilobular emphysema in the upper lobes. There is biapical pleural-parenchymal thickening. This linear scarring at the RIGHT lung base. No airspace or ground-glass opacities. No pleural fluid. Airways are normal. Upper Abdomen: Infrarenal abdominal aorta is aneurysmal at 3.7 cm. The aorta is not completely imaged. Heavy intimal calcification. There is a peripherally calcified lesion of the RIGHT adrenal gland measuring 2.4 by 3.4 cm. This lesion cannot be characterized as benign on this contrast exam. Lesion however is stable in size compared to CT of 06/15/2014 and PET of 2007 consistent benign lesion. No spinal hiatal hernia noted. A simple fluid lesion of  the LEFT kidney noted. Musculoskeletal: No aggressive osseous lesion. Review of the MIP images confirms the above findings. IMPRESSION: 1. No acute pulmonary embolism. 2. No acute pulmonary parenchymal findings. 3. Mild centrilobular emphysema and pulmonary scarring. 4. Heavy intimal calcification of the aorta and the coronary arteries. 5. Abdominal aortic aneurysm at 3.7 cm but incompletely imaged. Recommend followup by ultrasound in 2 years. This recommendation follows ACR consensus guidelines: White Paper of the ACR Incidental Findings Committee II on Vascular Findings. J Am Coll Radiol 2013; 10:789-794. Electronically Signed   By: Genevive BiStewart  Edmunds M.D.   On: 08/06/2016 16:24     IMPRESSION AND PLAN:   * Acute COPD exacerbation with acute hypoxic respiratory failure Failed outpatient treatment with Levaquin and prednisone -IV steroids, Antibiotics - Scheduled Nebulizers - Inhalers -Wean O2 as tolerated - Consult pulmonary if no improvement  * Hypertension Continue home medications  * CKD stage III is stable.  * DVT prophylaxis with Lovenox   All the records are reviewed and case discussed with ED provider. Management plans discussed with the patient, family and they are in agreement.  CODE STATUS: FULL  TOTAL TIME TAKING CARE OF THIS PATIENT: 40 minutes.   Milagros LollSudini, Osman Calzadilla R M.D on 08/06/2016 at 5:46  PM  Between 7am to 6pm - Pager - [phone]  After 6pm go to [email or web address] - password [email or web address]  [facility] [place] [facility]  Office  [phone]  CC: Primary care physician; Marguarite ArbourSPARKS,JEFFREY D, MD  Note: This dictation was prepared with Dragon dictation along with smaller phrase technology. Any transcriptional errors that result from this process are unintentional.

## 2016-08-06 NOTE — ED Provider Notes (Signed)
Time Seen: Approximately *1408  I have reviewed the triage notes  Chief Complaint: Shortness of Breath   History of Present Illness: Donna Quinn is a 80 y.o. female *who presents from [facility] for evaluation of shortness of breath. Patient has a history of COPD but is not currently on any supplemental home oxygen. She describes a 2 day history of increased shortness of breath with a dry nonproductive cough. She describes some chest tightness with no nausea, vomiting.   Past Medical History:  Diagnosis Date  . AAA (abdominal aortic aneurysm) (HCC)   . Abdominal aortic aneurysm (AAA) (HCC)   . Brain aneurysm   . COPD (chronic obstructive pulmonary disease) (HCC)   . Dyspnea   . H/O wheezing   . Hypercholesterolemia   . Hypertension   . Osteoporosis   . Stroke [facility](HCC) 2016   twice    There are no active problems to display for this patient.   Past Surgical History:  Procedure Laterality Date  . abdominal Bilateral    stents  . BACK SURGERY     cyst removed from back  . BRAIN SURGERY     Brain aneurysm repair, coiling  . CAROTID ENDARTERECTOMY    . CATARACT EXTRACTION W/PHACO Left 07/08/2016   Procedure: CATARACT EXTRACTION PHACO AND INTRAOCULAR LENS PLACEMENT (IOC);  Surgeon: Nevada CraneBradley Mark King, MD;  Location: [facility] ORS;  Service: Ophthalmology;  Laterality: Left;  US  01:30 AP% 39.1 CDE 20.3 Fluid pack lot # [id number] H    Past Surgical History:  Procedure Laterality Date  . abdominal Bilateral    stents  . BACK SURGERY     cyst removed from back  . BRAIN SURGERY     Brain aneurysm repair, coiling  . CAROTID ENDARTERECTOMY    . CATARACT EXTRACTION W/PHACO Left 07/08/2016   Procedure: CATARACT EXTRACTION PHACO AND INTRAOCULAR LENS PLACEMENT (IOC);  Surgeon: Nevada CraneBradley Mark King, MD;  Location: [facility] ORS;  Service: Ophthalmology;  Laterality: Left;  US  01:30 AP% 39.1 CDE 20.3 Fluid pack lot # [id number] H    Current Outpatient Rx  . Order #: [id number] Class:  Historical Med  . Order #: [id number] Class: Historical Med  . Order #: [id number] Class: Historical Med  . Order #: [id number] Class: Historical Med  . Order #: [id number] Class: Historical Med  . Order #: [id number] Class: Historical Med  . Order #: [id number] Class: Historical Med  . Order #: [id number] Class: Historical Med  . Order #: [id number] Class: Historical Med  . Order #: [id number] Class: Historical Med  . Order #: [id number] Class: Historical Med    Allergies:  Aggrenox [aspirin-dipyridamole er]; Augmentin [amoxicillin-pot clavulanate]; Penicillins; Remeron [mirtazapine]; and Sulfa antibiotics  Family History: Family History  Problem Relation Age of Onset  . Breast cancer Sister 4562    Social History: Social History  Substance Use Topics  . Smoking status: Former Games developermoker  . Smokeless tobacco: Never Used  . Alcohol use No     Review of Systems:   10 point review of systems was performed and was otherwise negative:  Constitutional: No fever Eyes: No visual disturbances ENT: No sore throat, ear pain Cardiac:Diffuse chest tightness which seems to be pleuritic  Respiratory: Shortness of breath, or stridor Abdomen: No abdominal pain, no vomiting, No diarrhea Endocrine: No weight loss, No night sweats Extremities: No peripheral edema, cyanosis Skin: No rashes, easy bruising Neurologic: No focal weakness, trouble with speech or swollowing Urologic: No dysuria, Hematuria, or urinary frequency   Physical Exam:  ED Triage Vitals  Enc Vitals Group  BP 08/06/16 1339 (!) 183/81     Pulse Rate 08/06/16 1339 96     Resp 08/06/16 1339 (!) 44     Temp 08/06/16 1339 97.8 F (36.6 C)     Temp Source 08/06/16 1339 Oral     SpO2 08/06/16 1339 98 %     Weight 08/06/16 1340 139 lb (63 kg)     Height 08/06/16 1340 5\' 6"  (1.676 m)     Head Circumference --      Peak Flow --      Pain Score --      Pain Loc --      Pain Edu? --      Excl. in GC? --     General: Awake , Alert ,  and Oriented times 3; GCS 15 Patient's currently on supplemental oxygen with no signs of respiratory distress other than speaking in brief sentences Head: Normal cephalic , atraumatic Eyes: Pupils equal , round, reactive to light Nose/Throat: No nasal drainage, patent upper airway without erythema or exudate.  Neck: Supple, Full range of motion, No anterior adenopathy or palpable thyroid masses Lungs: Bilateral wheezing auscultated at the apices without rales or rhonchi  Heart: Regular rate, regular rhythm without murmurs , gallops , or rubs Abdomen: Soft, non tender without rebound, guarding , or rigidity; bowel sounds positive and symmetric in all 4 quadrants. No organomegaly .        Extremities: 2 plus symmetric pulses. No edema, clubbing or cyanosis Neurologic: normal ambulation, Motor symmetric without deficits, sensory intact Skin: warm, dry, no rashes   Labs:   All laboratory work was reviewed including any pertinent negatives or positives listed below:  Labs Reviewed  COMPREHENSIVE METABOLIC PANEL - Abnormal; Notable for the following:       Result Value   Sodium 131 (*)    Chloride 96 (*)    Glucose, Bld 202 (*)    BUN 21 (*)    Creatinine, Ser 1.48 (*)    Calcium 8.8 (*)    GFR calc non Af Amer 33 (*)    GFR calc Af Amer 38 (*)    All other components within normal limits  CBC WITH DIFFERENTIAL/PLATELET - Abnormal; Notable for the following:    Hemoglobin 11.9 (*)    HCT 34.5 (*)    Neutro Abs 7.8 (*)    Lymphs Abs 0.2 (*)    All other components within normal limits  TROPONIN I - Abnormal; Notable for the following:    Troponin I 0.03 (*)    All other components within normal limits  FIBRIN DERIVATIVES D-DIMER ([facility] ONLY) - Abnormal; Notable for the following:    Fibrin derivatives D-dimer [facility]) 1,229 (*)    All other components within normal limits  BRAIN NATRIURETIC PEPTIDE    EKG:  ED ECG REPORT I, Jennye Moccasin, the attending physician, personally  viewed and interpreted this ECG.  Date: 08/06/2016 EKG Time: 1351 Rate:100 Rhythm: normal sinus rhythm QRS Axis: Left axis deviation Intervals: normal ST/T Wave abnormalities: normal Conduction Disturbances: none Narrative Interpretation: unremarkable left ventricular hypertrophy   Radiology "Dg Chest 2 View  Result Date: 08/06/2016 CLINICAL DATA:  COPD, shortness of Breath EXAM: CHEST  2 VIEW COMPARISON:  03/12/2015 FINDINGS: Cardiomediastinal silhouette is stable. Scarring in right costophrenic angle again noted. No infiltrate or pleural effusion. No pulmonary edema. Hyperinflation again noted. Osteopenia and mild degenerative changes thoracic spine. Moderate size hiatal hernia. IMPRESSION: Scarring in right costophrenic angle again noted. No  infiltrate or pleural effusion. No pulmonary edema. Hyperinflation again noted. Osteopenia and mild degenerative changes thoracic spine. Moderate size hiatal hernia. Electronically Signed   By: Natasha MeadLiviu  Pop M.D.   On: 08/06/2016 14:24  "  I personally reviewed the radiologic studies  CT of the chest is pending  ED Course:  Patient has a history of COPD and presents with bronchitis type symptoms. She has had some remote surgery and her D-dimer test is positive. Patient received chest CT evaluation. Patient's otherwise hemodynamically stable and started on IV Solu-Medrol along with a repeat DuoNeb treatment. She felt improved after the first treatment. He arrives not hypoxic that tachypneic. Clinical Course      Assessment:  Acute dyspnea   Final Clinical Impression:   Final diagnoses:  Shortness of breath     Plan: * Follow patient's chest CT and reexamination after DuoNeb therapy            Jennye MoccasinBrian S Brunella Wileman, MD 08/06/16 1537

## 2016-08-06 NOTE — ED Provider Notes (Signed)
-----------------------------------------   5:18 PM on 08/06/2016 -----------------------------------------  CT scan is largely negative. Patient continues to have diffuse expiratory wheeze, with moderate shortness of breath. Patient's room air saturation is 89-91 percent. Increases to 96% on 2 L. She has no home O2 requirement at baseline. We'll admit the patient to the hospital for COPD exacerbation.   Minna AntisKevin Leanza Shepperson, MD 08/06/16 1719

## 2016-08-06 NOTE — ED Notes (Signed)
PT refused flu swab. Pt verbalized, "I do not have the flu." MD made aware and verbalized to D/C order.

## 2016-08-06 NOTE — ED Triage Notes (Signed)
[facility] short[facility]. Patient was placed on O2 by walk in clinic and was given 1 breathing treatment.

## 2016-08-06 NOTE — ED Triage Notes (Signed)
Pt is pursed lip breathing with audible wheezing noted, pt is in a tripod position in the wheelchair, pt was placed on o2 by the clinic today due to sob

## 2016-08-06 NOTE — Progress Notes (Signed)
Anticoagulation monitoring(Lovenox):    80 yo  Female  ordered Lovenox 40 mg Q24h  Filed Weights   08/06/16 1340  Weight: 139 lb (63 kg)   BMI    Lab Results  Component Value Date   CREATININE 1.48 (H) 08/06/2016   CREATININE 1.28 06/15/2014   Estimated Creatinine Clearance: 29.3 mL/min (by C-G formula based on SCr of 1.48 mg/dL (H)). Hemoglobin & Hematocrit     Component Value Date/Time   HGB 11.9 (L) 08/06/2016 1348   HGB 12.7 06/15/2014 1234   HCT 34.5 (L) 08/06/2016 1348   HCT 37.0 06/15/2014 1234     Per Protocol for Patient with estCrcl < 30 ml/min and BMI < 40, will transition to Lovenox 30 mg Q24h.

## 2016-08-07 DIAGNOSIS — R0602 Shortness of breath: Secondary | ICD-10-CM | POA: Diagnosis not present

## 2016-08-07 DIAGNOSIS — J441 Chronic obstructive pulmonary disease with (acute) exacerbation: Secondary | ICD-10-CM | POA: Diagnosis not present

## 2016-08-07 DIAGNOSIS — J9601 Acute respiratory failure with hypoxia: Secondary | ICD-10-CM | POA: Diagnosis not present

## 2016-08-07 DIAGNOSIS — I10 Essential (primary) hypertension: Secondary | ICD-10-CM | POA: Diagnosis not present

## 2016-08-07 DIAGNOSIS — E871 Hypo-osmolality and hyponatremia: Secondary | ICD-10-CM | POA: Diagnosis not present

## 2016-08-07 DIAGNOSIS — Z8673 Personal history of transient ischemic attack (TIA), and cerebral infarction without residual deficits: Secondary | ICD-10-CM | POA: Diagnosis not present

## 2016-08-07 DIAGNOSIS — N183 Chronic kidney disease, stage 3 (moderate): Secondary | ICD-10-CM | POA: Diagnosis not present

## 2016-08-07 DIAGNOSIS — Z9889 Other specified postprocedural states: Secondary | ICD-10-CM | POA: Diagnosis not present

## 2016-08-07 DIAGNOSIS — I129 Hypertensive chronic kidney disease with stage 1 through stage 4 chronic kidney disease, or unspecified chronic kidney disease: Secondary | ICD-10-CM | POA: Diagnosis not present

## 2016-08-07 DIAGNOSIS — Z9842 Cataract extraction status, left eye: Secondary | ICD-10-CM | POA: Diagnosis not present

## 2016-08-07 DIAGNOSIS — Z961 Presence of intraocular lens: Secondary | ICD-10-CM | POA: Diagnosis not present

## 2016-08-07 LAB — BASIC METABOLIC PANEL
Anion gap: 7 (ref 5–15)
BUN: 19 mg/dL (ref 6–20)
CALCIUM: 9 mg/dL (ref 8.9–10.3)
CO2: 28 mmol/L (ref 22–32)
Chloride: 101 mmol/L (ref 101–111)
Creatinine, Ser: 1.06 mg/dL — ABNORMAL HIGH (ref 0.44–1.00)
GFR calc Af Amer: 57 mL/min — ABNORMAL LOW (ref >60)
GFR, EST NON AFRICAN AMERICAN: 49 mL/min — AB (ref >60)
GLUCOSE: 118 mg/dL — AB (ref 65–99)
Potassium: 4 mmol/L (ref 3.5–5.1)
Sodium: 136 mmol/L (ref 135–145)

## 2016-08-07 IMAGING — CT CT ANGIO CHEST
2 of 6 series · 18 of 36 positions shown · IV contrast (APPLIED)
Comparison: Chest radiographs obtained earlier today. Chest CT
dated 06/09/2006.

CLINICAL DATA: Pt arrives c/o increased SOB, labored breathing.
States that dyspnea is worse with exertion, increased fatigue. Had
echo done recently for same. Denies CP or palpitations. Denies
peripheral edema. Tachypneic on assessment.

EXAM:
CT ANGIOGRAPHY CHEST WITH CONTRAST
TECHNIQUE: Multidetector CT imaging of the chest was performed using the
standard protocol during bolus administration of intravenous
contrast. Multiplanar CT image reconstructions and MIPs were
obtained to evaluate the vascular anatomy.
CONTRAST:  75 cc Isovue 370

[Series 5: pe 1.0 thins · axial · 0.70mm/px · z∈[-316,-64]mm · 17 of 284 slices shown]
[im 16/284  lung]
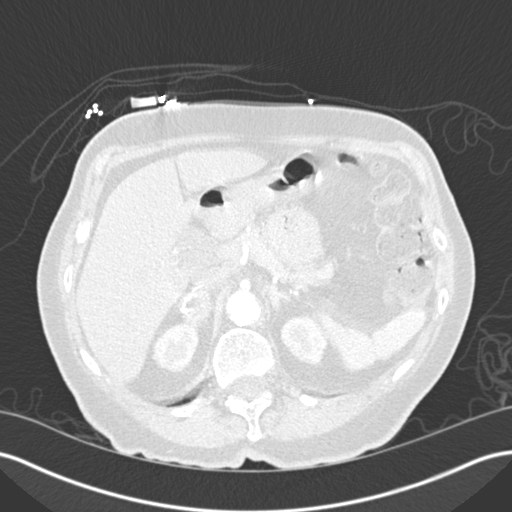
[im 32/284  mediastinal]
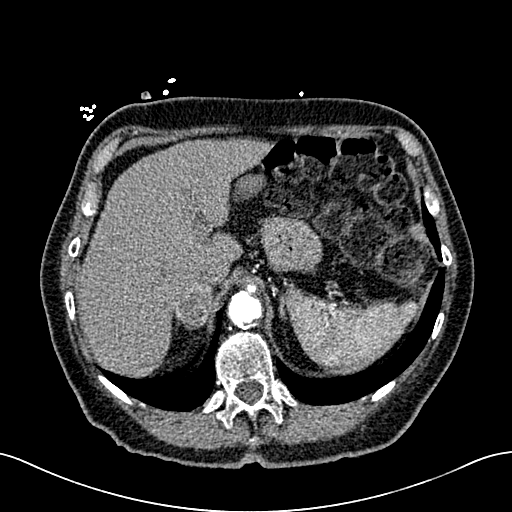
[im 48/284  lung]
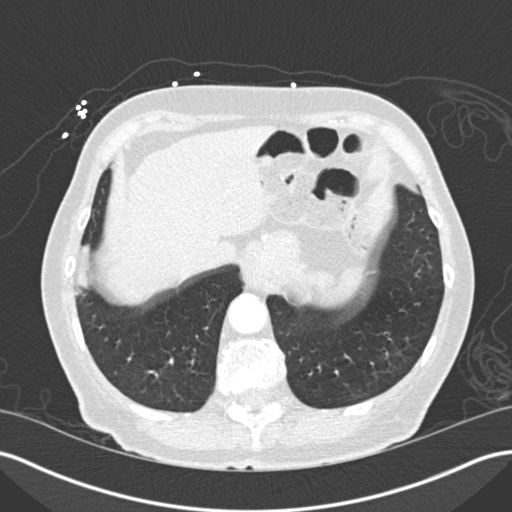
[im 63/284  mediastinal]
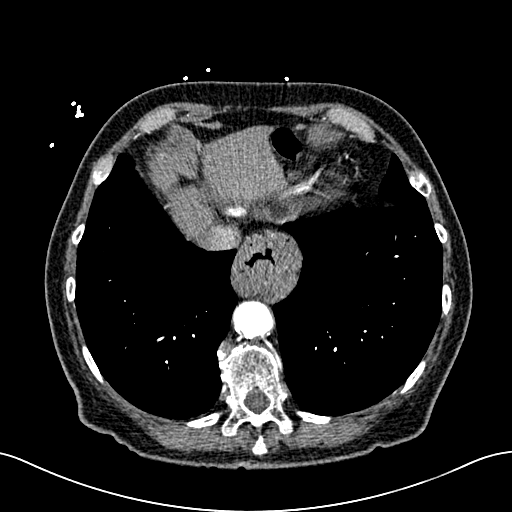
[im 79/284  lung]
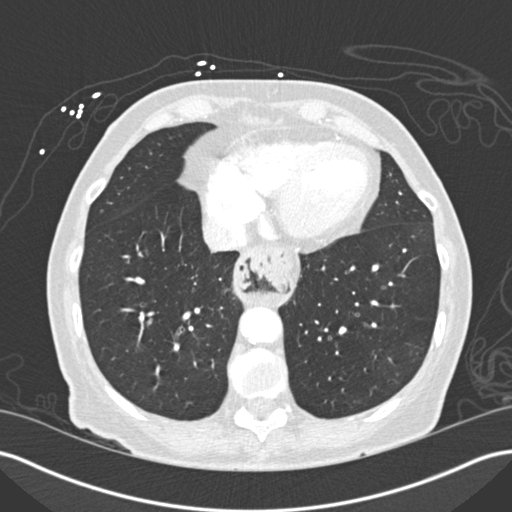
[im 95/284  mediastinal]
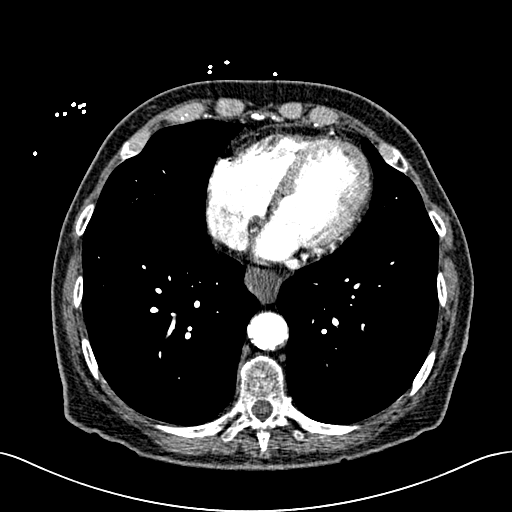
[im 111/284  lung]
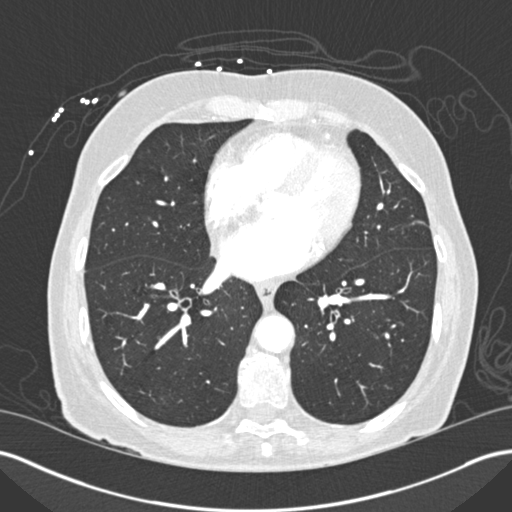
[im 126/284  mediastinal]
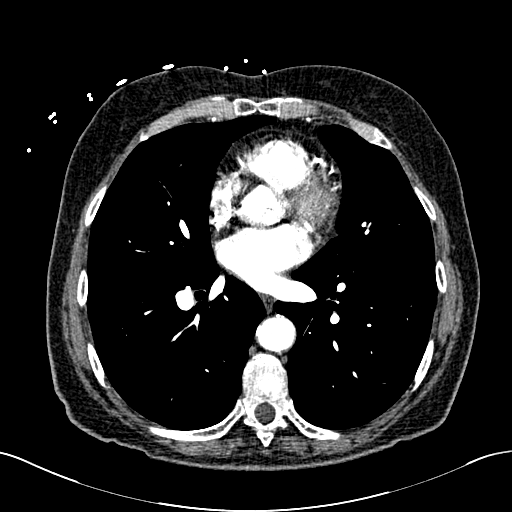
[im 142/284  lung]
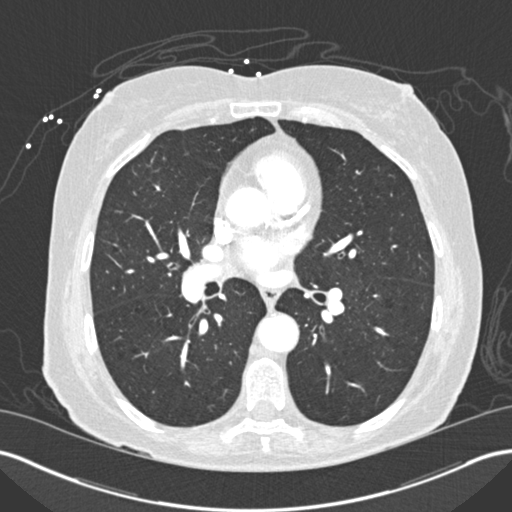
[im 158/284  mediastinal]
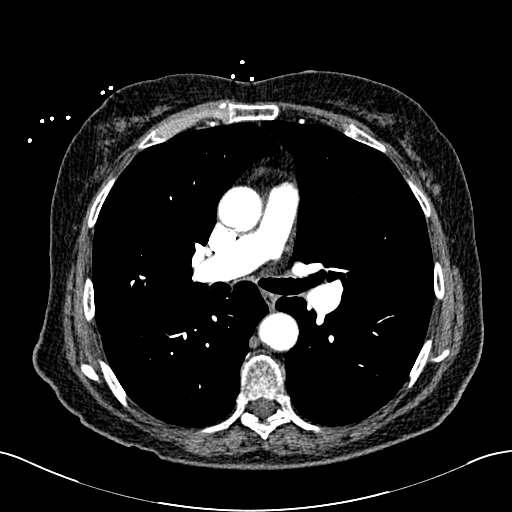
[im 173/284  lung]
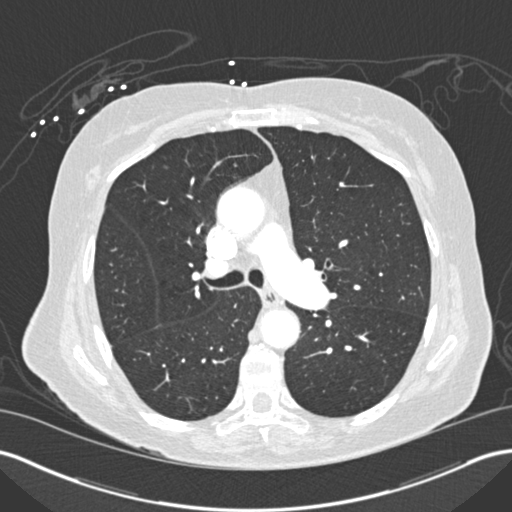
[im 189/284  mediastinal]
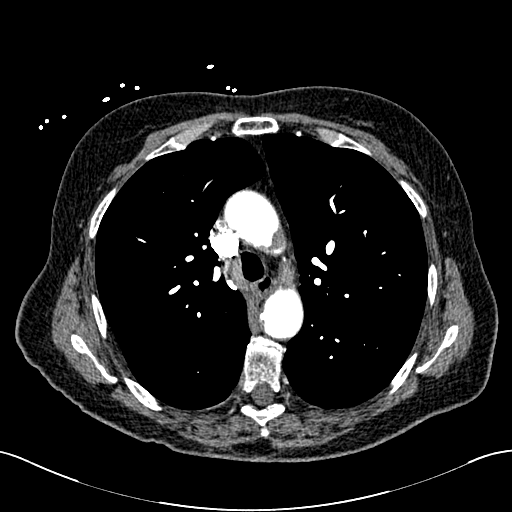
[im 205/284  lung]
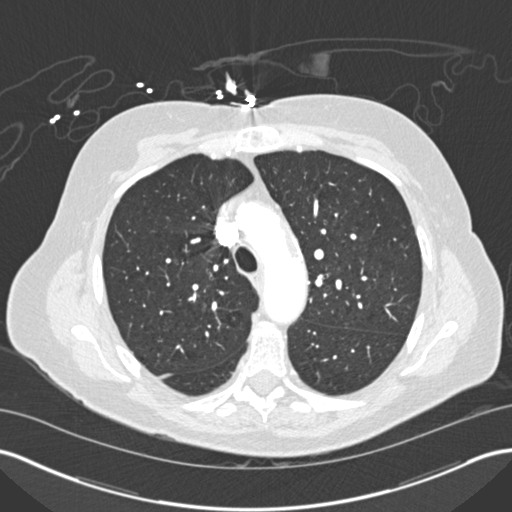
[im 221/284  mediastinal]
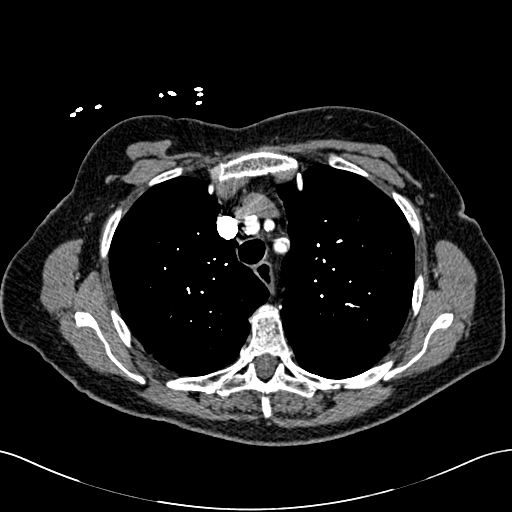
[im 236/284  lung]
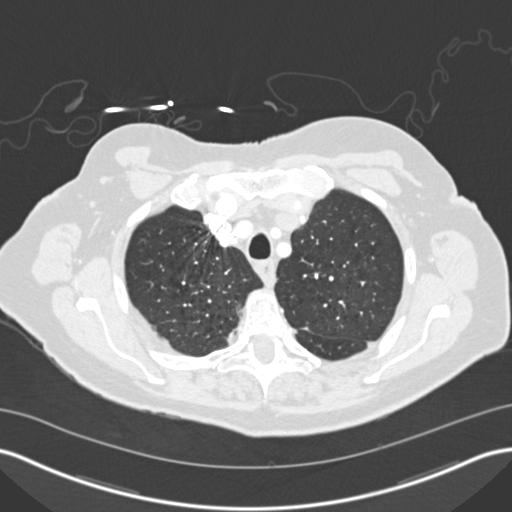
[im 252/284  mediastinal]
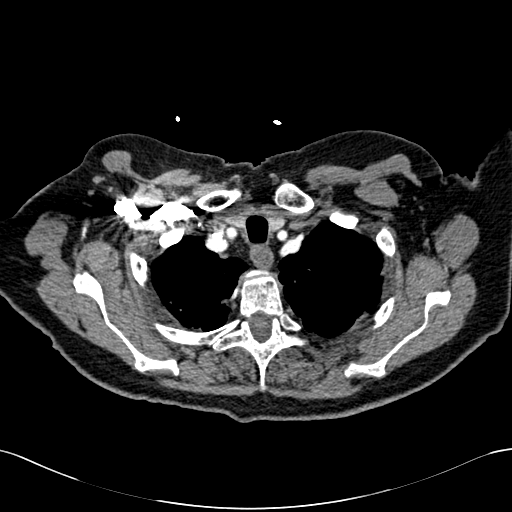
[im 268/284  lung]
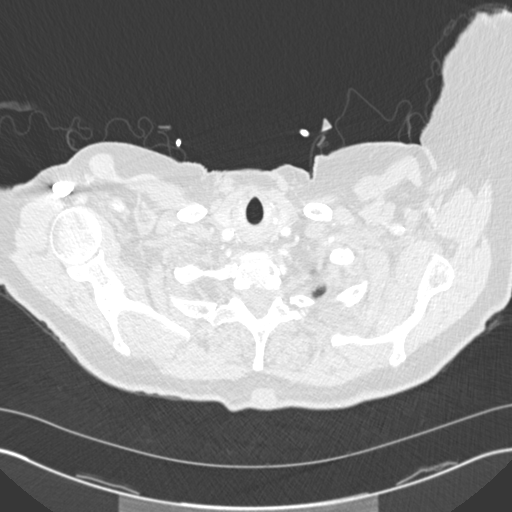

[Series 7: cor pe 2.0 mpr · coronal · 0.60mm/px · 1 of 133 slices shown]
[im 67/133  mediastinal]
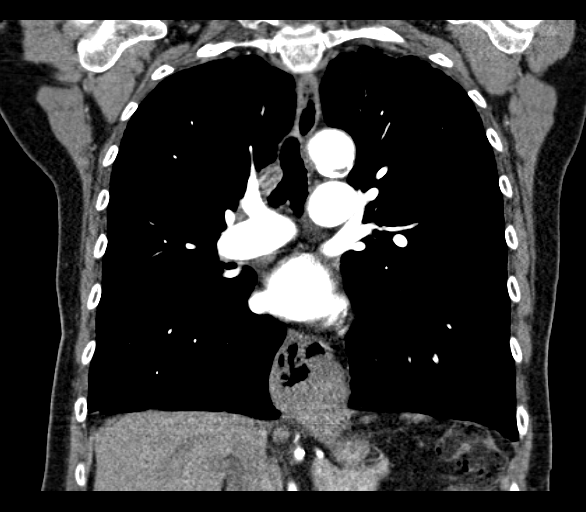

[18 of 36 positions shown; findings below may reference images not displayed]

FINDINGS: Normally opacified pulmonary arteries with no pulmonary arterial
filling defects seen. Mild bullous changes bilaterally and mild
diffuse peribronchial thickening. No lung nodules or enlarged lymph
nodes. Moderate sized hiatal hernia. Thoracic spine degenerative
changes and mild scoliosis. Decreased size of a low density right
adrenal mass with peripheral calcifications. This measures 2.5 x
cm on image number 141 and previously measured 3.4 x 2.5 cm in
corresponding dimensions.

Review of the MIP images confirms the above findings.
IMPRESSION: 1. Known pulmonary emboli or acute abnormality.
2. Changes of COPD and chronic bronchitis.
3. Interval decrease in size of an old right adrenal hemorrhage with
peripheral calcification.
4. Moderate-sized sliding hiatal hernia.

## 2016-08-07 MED ORDER — METHYLPREDNISOLONE SODIUM SUCC 40 MG IJ SOLR
40.0000 mg | Freq: Two times a day (BID) | INTRAMUSCULAR | Status: DC
  Administered 2016-08-07 – 2016-08-08 (×2): 40 mg via INTRAVENOUS
  Filled 2016-08-07 (×2): qty 1

## 2016-08-07 MED ORDER — ALUM & MAG HYDROXIDE-SIMETH 200-200-20 MG/5ML PO SUSP
15.0000 mL | ORAL | Status: DC | PRN
Start: 2016-08-07 — End: 2016-08-08
  Administered 2016-08-07: 15 mL via ORAL
  Filled 2016-08-07: qty 30

## 2016-08-07 NOTE — Progress Notes (Signed)
[facility] [place] at [facility]   PATIENT NAME: Donna DeterBetty Quinn    MR#:  [id number]  DATE OF BIRTH:  07/25/1936  SUBJECTIVE:  CHIEF COMPLAINT:  Patient is feeling slightly better today. Cough improved some. Reporting  SOB with minimal exertion. Daughter and son at bedside  REVIEW OF SYSTEMS:  CONSTITUTIONAL: No fever, fatigue or weakness.  EYES: No blurred or double vision.  EARS, NOSE, AND THROAT: No tinnitus or ear pain.  RESPIRATORY:some cough, shortness of breath with minimal exertion, some wheezing , denies hemoptysis.  CARDIOVASCULAR: No chest pain, orthopnea, edema.  GASTROINTESTINAL: No nausea, vomiting, diarrhea or abdominal pain.  GENITOURINARY: No dysuria, hematuria.  ENDOCRINE: No polyuria, nocturia,  HEMATOLOGY: No anemia, easy bruising or bleeding SKIN: No rash or lesion. MUSCULOSKELETAL: No joint pain or arthritis.   NEUROLOGIC: No tingling, numbness, weakness.  PSYCHIATRY: No anxiety or depression.   DRUG ALLERGIES:   Allergies  Allergen Reactions  . Aggrenox [Aspirin-Dipyridamole Er]     A really bad headache  . Augmentin [Amoxicillin-Pot Clavulanate]   . Penicillins Other (See Comments)    Has patient had a PCN reaction causing immediate rash, facial/tongue/throat swelling, SOB or lightheadedness with hypotension: Yes Has patient had a PCN reaction causing severe rash involving mucus membranes or skin necrosis: Yes Has patient had a PCN reaction that required hospitalization No Has patient had a PCN reaction occurring within the last 10 years: No If all of the above answers are "NO", then may proceed with Cephalosporin use.  . Remeron [Mirtazapine]     Broke out inside the mouth  . Sulfa Antibiotics     Broke out in welps and had to be hospitalized    VITALS:  Blood pressure 134/61, pulse 84, temperature 98 F (36.7 C), temperature source Oral, resp. rate 16, height 5\' 6"  (1.676 m), weight 61.8 kg (136 lb 3.2 oz), SpO2 97  %.  PHYSICAL EXAMINATION:  GENERAL:  80 y.o.-year-old patient lying in the bed with no acute distress.  EYES: Pupils equal, round, reactive to light and accommodation. No scleral icterus. Extraocular muscles intact.  HEENT: Head atraumatic, normocephalic. Oropharynx and nasopharynx clear.  NECK:  Supple, no jugular venous distention. No thyroid enlargement, no tenderness.  LUNGS: Diminished breath sounds bilaterally, bilateral end expiratory wheezing, no rales,rhonchi or crepitation. No use of accessory muscles of respiration.  CARDIOVASCULAR: S1, S2 normal. No murmurs, rubs, or gallops.  ABDOMEN: Soft, nontender, nondistended. Bowel sounds present. No organomegaly or mass.  EXTREMITIES: No pedal edema, cyanosis, or clubbing.  NEUROLOGIC: Cranial nerves II through XII are intact. Muscle strength 5/5 in all extremities. Sensation intact. Gait not checked.  PSYCHIATRIC: The patient is alert and oriented x 3.  SKIN: No obvious rash, lesion, or ulcer.    LABORATORY PANEL:   CBC  Recent Labs Lab 08/06/16 1348  WBC 8.2  HGB 11.9*  HCT 34.5*  PLT 245   ------------------------------------------------------------------------------------------------------------------  Chemistries   Recent Labs Lab 08/06/16 1348 08/07/16 0545  NA 131* 136  K 4.1 4.0  CL 96* 101  CO2 25 28  GLUCOSE 202* 118*  BUN 21* 19  CREATININE 1.48* 1.06*  CALCIUM 8.8* 9.0  AST 28  --   ALT 19  --   ALKPHOS 58  --   BILITOT 0.5  --    ------------------------------------------------------------------------------------------------------------------  Cardiac Enzymes  Recent Labs Lab 08/06/16 1348  TROPONINI 0.03*   ------------------------------------------------------------------------------------------------------------------  RADIOLOGY:  Dg Chest 2 View  Result Date: 08/06/2016 CLINICAL DATA:  COPD, shortness of Breath EXAM: CHEST  2 VIEW COMPARISON:  03/12/2015 FINDINGS: Cardiomediastinal  silhouette is stable. Scarring in right costophrenic angle again noted. No infiltrate or pleural effusion. No pulmonary edema. Hyperinflation again noted. Osteopenia and mild degenerative changes thoracic spine. Moderate size hiatal hernia. IMPRESSION: Scarring in right costophrenic angle again noted. No infiltrate or pleural effusion. No pulmonary edema. Hyperinflation again noted. Osteopenia and mild degenerative changes thoracic spine. Moderate size hiatal hernia. Electronically Signed   By: Natasha MeadLiviu  Pop M.D.   On: 08/06/2016 14:24   Ct Angio Chest Pe W Or Wo Contrast  Result Date: 08/06/2016 CLINICAL DATA:  Short of breath. EXAM: CT ANGIOGRAPHY CHEST WITH CONTRAST TECHNIQUE: Multidetector CT imaging of the chest was performed using the standard protocol during bolus administration of intravenous contrast. Multiplanar CT image reconstructions and MIPs were obtained to evaluate the vascular anatomy. CONTRAST:  60 mL Isovue COMPARISON:  None. FINDINGS: Cardiovascular: No filling defects in the pulmonary arteries to suggest acute pulmonary embolism. Coronary artery calcification and aortic atherosclerotic calcification. No pericardial fluid. Mediastinum/Nodes: No axillary or supraclavicular adenopathy. No mediastinal hilar adenopathy. Esophagus is gas-filled. Lungs/Pleura: Mild centrilobular emphysema in the upper lobes. There is biapical pleural-parenchymal thickening. This linear scarring at the RIGHT lung base. No airspace or ground-glass opacities. No pleural fluid. Airways are normal. Upper Abdomen: Infrarenal abdominal aorta is aneurysmal at 3.7 cm. The aorta is not completely imaged. Heavy intimal calcification. There is a peripherally calcified lesion of the RIGHT adrenal gland measuring 2.4 by 3.4 cm. This lesion cannot be characterized as benign on this contrast exam. Lesion however is stable in size compared to CT of 06/15/2014 and PET of 2007 consistent benign lesion. No spinal hiatal hernia noted. A  simple fluid lesion of the LEFT kidney noted. Musculoskeletal: No aggressive osseous lesion. Review of the MIP images confirms the above findings. IMPRESSION: 1. No acute pulmonary embolism. 2. No acute pulmonary parenchymal findings. 3. Mild centrilobular emphysema and pulmonary scarring. 4. Heavy intimal calcification of the aorta and the coronary arteries. 5. Abdominal aortic aneurysm at 3.7 cm but incompletely imaged. Recommend followup by ultrasound in 2 years. This recommendation follows ACR consensus guidelines: White Paper of the ACR Incidental Findings Committee II on Vascular Findings. J Am Coll Radiol 2013; 10:789-794. Electronically Signed   By: Genevive BiStewart  Edmunds M.D.   On: 08/06/2016 16:24    EKG:   Orders placed or performed during the hospital encounter of 08/06/16  . ED EKG  . ED EKG    ASSESSMENT AND PLAN:   * Acute COPD exacerbation with acute hypoxic respiratory failure Failed outpatient treatment with Levaquin and prednisone -Clinically improving with IV steroids, Antibiotics - Scheduled Nebulizers - Inhalers -Wean O2 as tolerated -Incentive spirometry - Consult pulmonary if no improvement -Provide symptomatically treatment with antitussives -Out of bed as tolerated  *Hyponatremia Resolved sodium 131-136 today  * Hypertension Continue home medications  * CKD stage III is stable.  * DVT prophylaxis with Lovenox     All the records are reviewed and case discussed with Care Management/Social Workerr. Management plans discussed with the patient, family and they are in agreement.  CODE STATUS: fc  TOTAL TIME TAKING CARE OF THIS PATIENT: 37 minutes.   POSSIBLE D/C IN 1-2 DAYS, DEPENDING ON CLINICAL CONDITION.  Note: This dictation was prepared with Dragon dictation along with smaller phrase technology. Any transcriptional errors that result from this process are unintentional.   Ramonita LabGouru, Janelle Spellman M.D on 08/07/2016 at 12:48 PM  Between 7am  to 6pm - Pager -  [phone] After 6pm go to [email or web address] - password [email or web address] [facility]  [place] [facility]  Office  [phone]  CC: Primary care physician; Marguarite Arbour, MD

## 2016-08-08 DIAGNOSIS — R0602 Shortness of breath: Secondary | ICD-10-CM | POA: Diagnosis not present

## 2016-08-08 DIAGNOSIS — E871 Hypo-osmolality and hyponatremia: Secondary | ICD-10-CM | POA: Diagnosis not present

## 2016-08-08 DIAGNOSIS — I129 Hypertensive chronic kidney disease with stage 1 through stage 4 chronic kidney disease, or unspecified chronic kidney disease: Secondary | ICD-10-CM | POA: Diagnosis not present

## 2016-08-08 DIAGNOSIS — I10 Essential (primary) hypertension: Secondary | ICD-10-CM | POA: Diagnosis not present

## 2016-08-08 DIAGNOSIS — N183 Chronic kidney disease, stage 3 (moderate): Secondary | ICD-10-CM | POA: Diagnosis not present

## 2016-08-08 DIAGNOSIS — J9601 Acute respiratory failure with hypoxia: Secondary | ICD-10-CM | POA: Diagnosis not present

## 2016-08-08 DIAGNOSIS — Z8673 Personal history of transient ischemic attack (TIA), and cerebral infarction without residual deficits: Secondary | ICD-10-CM | POA: Diagnosis not present

## 2016-08-08 DIAGNOSIS — Z9889 Other specified postprocedural states: Secondary | ICD-10-CM | POA: Diagnosis not present

## 2016-08-08 DIAGNOSIS — Z961 Presence of intraocular lens: Secondary | ICD-10-CM | POA: Diagnosis not present

## 2016-08-08 DIAGNOSIS — Z9842 Cataract extraction status, left eye: Secondary | ICD-10-CM | POA: Diagnosis not present

## 2016-08-08 DIAGNOSIS — J441 Chronic obstructive pulmonary disease with (acute) exacerbation: Secondary | ICD-10-CM | POA: Diagnosis not present

## 2016-08-08 MED ORDER — AZITHROMYCIN 250 MG PO TABS: ORAL_TABLET | ORAL | 0 refills | Status: DC

## 2016-08-08 MED ORDER — POLYETHYLENE GLYCOL 3350 17 G PO PACK: 17.0000 g | PACK | Freq: Every day | ORAL | 0 refills | Status: DC | PRN

## 2016-08-08 MED ORDER — PREDNISONE 10 MG (21) PO TBPK: 10.0000 mg | ORAL_TABLET | Freq: Every day | ORAL | 0 refills | Status: DC

## 2016-08-08 NOTE — Progress Notes (Signed)
Patient is d/ced home.  Currently not on supplemental O2 and is satting 96% on RA.  Patient does have some shortness of breath with exertion. She's been receiving IV steroids.  Gave miralax today since last BM was 12/27. She will be going home on zithromax, miralax, and a prednisone taper. Recent visit and treatment with levaquin and prednisone didn't resolve COPD exacerbation and she was admitted to hospital on 12/29.  Lung sounds are clear but diminished. Reviewed d/c instructions.  IV removed and she will be taken home by her daughter.

## 2016-08-08 NOTE — Discharge Instructions (Signed)
Follow-up with primary care physician Dr. Judithann SheenSparks in a week

## 2016-08-08 NOTE — Discharge Summary (Signed)
[facility] - [place] at [facility]   PATIENT NAME: Donna Quinn    MR#:  [id number]  DATE OF BIRTH:  August 15, 1935  DATE OF ADMISSION:  08/06/2016 ADMITTING PHYSICIAN: Milagros Loll, MD  DATE OF DISCHARGE: 08/08/16 PRIMARY CARE PHYSICIAN: SPARKS,JEFFREY D, MD    ADMISSION DIAGNOSIS:  Shortness of breath [R06.02] COPD exacerbation (HCC) [J44.1]  DISCHARGE DIAGNOSIS:  Active Problems:   COPD exacerbation (HCC)   SECONDARY DIAGNOSIS:   Past Medical History:  Diagnosis Date  . AAA (abdominal aortic aneurysm) (HCC)   . Abdominal aortic aneurysm (AAA) (HCC)   . Brain aneurysm   . Collagen vascular disease (HCC)   . COPD (chronic obstructive pulmonary disease) (HCC)   . Dyspnea   . H/O wheezing   . Hypercholesterolemia   . Hypertension   . Osteoporosis   . Stroke [facility]) 2016   twice    HOSPITAL COURSE:   Brief history and physical Donna Quinn  is a 80 y.o. female with a known history of COPD, hypertension, CVA, abdominal aortic aneurysm not on home oxygen presents to the emergency room complaining of shortness of breath, cough, wheezing. Her insurance that she always has shortness of breath but this is worse. She was seen at [facility] clinic on 08/03/2016 and started on Levaquin and prednisone. With her symptoms worsening she went back to the clinic today and was referred to the emergency room. Here she has received IV steroids, nebulizers in spite of which she has significant expiratory wheezing, shortness of breath needing 2 L suctioned to keep her sats more than 90%. No sick contacts. She received a flu vaccine this season.  D-dimer was checked which was elevated and the CTA of the chest was done which showed no pulmonary embolism or infiltrates. Please review history and physical for complete details  Hospital course * Acute COPD exacerbation with acute hypoxic respiratory failure Failed outpatient treatment with Levaquin and  prednisone -Clinically improving with IV steroids, and azithromycin. Discharge with a tapering steroids and azithromycin  - Scheduld Nebulizers - Inhalers -Weaned O2 to RA -Incentive spirometry --Provide symptomatically treatment with antitussives -Clinically improved. Back to her baseline according to the family members. We will discharge home  *Hyponatremia Resolved sodium 131-136 today  * Hypertension Continue home medications  * CKD stage III is stable.  *DVT prophylaxis with Lovenox   DISCHARGE CONDITIONS:   Stable  CONSULTS OBTAINED:     PROCEDURES None  DRUG ALLERGIES:   Allergies  Allergen Reactions  . Aggrenox [Aspirin-Dipyridamole Er]     A really bad headache  . Augmentin [Amoxicillin-Pot Clavulanate]   . Penicillins Other (See Comments)    Has patient had a PCN reaction causing immediate rash, facial/tongue/throat swelling, SOB or lightheadedness with hypotension: Yes Has patient had a PCN reaction causing severe rash involving mucus membranes or skin necrosis: Yes Has patient had a PCN reaction that required hospitalization No Has patient had a PCN reaction occurring within the last 10 years: No If all of the above answers are "NO", then may proceed with Cephalosporin use.  . Remeron [Mirtazapine]     Broke out inside the mouth  . Sulfa Antibiotics     Broke out in welps and had to be hospitalized    DISCHARGE MEDICATIONS:   Current Discharge Medication List    START taking these medications   Details  azithromycin (ZITHROMAX) 250 MG tablet Take 1 tablet by mouth once daily for 4 days Qty: 4 tablet, Refills: 0  polyethylene glycol (MIRALAX / GLYCOLAX) packet Take 17 g by mouth daily as needed for mild constipation. Qty: 14 each, Refills: 0    predniSONE (STERAPRED UNI-PAK 21 TAB) 10 MG (21) TBPK tablet Take 1 tablet (10 mg total) by mouth daily. Take 6 tablets by mouth for 1 day followed by  5 tablets by mouth for 1 day followed by  4  tablets by mouth for 1 day followed by  3 tablets by mouth for 1 day followed by  2 tablets by mouth for 1 day followed by  1 tablet by mouth for a day and stop Qty: 21 tablet, Refills: 0      CONTINUE these medications which have NOT CHANGED   Details  albuterol (PROVENTIL HFA;VENTOLIN HFA) 108 (90 Base) MCG/ACT inhaler Inhale 2 puffs into the lungs every 6 (six) hours as needed for wheezing or shortness of breath.    atorvastatin (LIPITOR) 40 MG tablet Take 40 mg by mouth every evening.     budesonide-formoterol (SYMBICORT) 160-4.5 MCG/ACT inhaler Inhale 2 puffs into the lungs 2 (two) times daily.    Cholecalciferol 2000 units CAPS Take 1 capsule by mouth every morning.    clopidogrel (PLAVIX) 75 MG tablet Take 75 mg by mouth every morning.    gabapentin (NEURONTIN) 300 MG capsule Take 300 mg by mouth 2 (two) times daily.    hydrALAZINE (APRESOLINE) 25 MG tablet Take 25 mg by mouth 2 (two) times daily.     losartan (COZAAR) 50 MG tablet Take 50 mg by mouth daily.     omeprazole (PRILOSEC) 20 MG capsule Take 20 mg by mouth 2 (two) times daily before a meal.    vitamin B-12 (CYANOCOBALAMIN) 1000 MCG tablet Take 1,000 mcg by mouth daily.      STOP taking these medications     levofloxacin (LEVAQUIN) 500 MG tablet      predniSONE (DELTASONE) 20 MG tablet          DISCHARGE INSTRUCTIONS:   Follow-up with primary care physician in a week  DIET:  LOW SALT  DISCHARGE CONDITION:  Stable  ACTIVITY:  Activity as tolerated  OXYGEN:  Home Oxygen: No.   Oxygen Delivery: room air  DISCHARGE LOCATION:  home   If you experience worsening of your admission symptoms, develop shortness of breath, life threatening emergency, suicidal or homicidal thoughts you must seek medical attention immediately by calling 911 or calling your MD immediately  if symptoms less severe.  You Must read complete instructions/literature along with all the possible adverse reactions/side  effects for all the Medicines you take and that have been prescribed to you. Take any new Medicines after you have completely understood and accpet all the possible adverse reactions/side effects.   Please note  You were cared for by a hospitalist during your hospital stay. If you have any questions about your discharge medications or the care you received while you were in the hospital after you are discharged, you can call the unit and asked to speak with the hospitalist on call if the hospitalist that took care of you is not available. Once you are discharged, your primary care physician will handle any further medical issues. Please note that NO REFILLS for any discharge medications will be authorized once you are discharged, as it is imperative that you return to your primary care physician (or establish a relationship with a primary care physician if you do not have one) for your aftercare needs so that they can reassess your  need for medications and monitor your lab values.     Today  Chief Complaint  Patient presents with  . Shortness of Breath   Patient is doing much better. Denies any shortness of breath. Ambulating in the room without any shortness of breath. According to the son and daughter at bedside patient is almost at her baseline  ROS:  CONSTITUTIONAL: Denies fevers, chills. Denies any fatigue, weakness.  EYES: Denies blurry vision, double vision, eye pain. EARS, NOSE, THROAT: Denies tinnitus, ear pain, hearing loss. RESPIRATORY: Denies cough, wheeze, shortness of breath.  CARDIOVASCULAR: Denies chest pain, palpitations, edema.  GASTROINTESTINAL: Denies nausea, vomiting, diarrhea, abdominal pain. Denies bright red blood per rectum. GENITOURINARY: Denies dysuria, hematuria. ENDOCRINE: Denies nocturia or thyroid problems. HEMATOLOGIC AND LYMPHATIC: Denies easy bruising or bleeding. SKIN: Denies rash or lesion. MUSCULOSKELETAL: Denies pain in neck, back, shoulder, knees,  hips or arthritic symptoms.  NEUROLOGIC: Denies paralysis, paresthesias.  PSYCHIATRIC: Denies anxiety or depressive symptoms.   VITAL SIGNS:  Blood pressure (!) 132/48, pulse 81, temperature 98.3 F (36.8 C), temperature source Oral, resp. rate 16, height 5\' 6"  (1.676 m), weight 61.4 kg (135 lb 6.4 oz), SpO2 96 %.  I/O:    Intake/Output Summary (Last 24 hours) at 08/08/16 1217 Last data filed at 08/08/16 0900  Gross per 24 hour  Intake              360 ml  Output                0 ml  Net              360 ml    PHYSICAL EXAMINATION:  GENERAL:  80 y.o.-year-old patient lying in the bed with no acute distress.  EYES: Pupils equal, round, reactive to light and accommodation. No scleral icterus. Extraocular muscles intact.  HEENT: Head atraumatic, normocephalic. Oropharynx and nasopharynx clear.  NECK:  Supple, no jugular venous distention. No thyroid enlargement, no tenderness.  LUNGS: Normal breath sounds bilaterally, no wheezing, rales,rhonchi or crepitation. No use of accessory muscles of respiration.  CARDIOVASCULAR: S1, S2 normal. No murmurs, rubs, or gallops.  ABDOMEN: Soft, non-tender, non-distended. Bowel sounds present. No organomegaly or mass.  EXTREMITIES: No pedal edema, cyanosis, or clubbing.  NEUROLOGIC: Cranial nerves II through XII are intact. Muscle strength 5/5 in all extremities. Sensation intact. Gait not checked.  PSYCHIATRIC: The patient is alert and oriented x 3.  SKIN: No obvious rash, lesion, or ulcer.   DATA REVIEW:   CBC  Recent Labs Lab 08/06/16 1348  WBC 8.2  HGB 11.9*  HCT 34.5*  PLT 245    Chemistries   Recent Labs Lab 08/06/16 1348 08/07/16 0545  NA 131* 136  K 4.1 4.0  CL 96* 101  CO2 25 28  GLUCOSE 202* 118*  BUN 21* 19  CREATININE 1.48* 1.06*  CALCIUM 8.8* 9.0  AST 28  --   ALT 19  --   ALKPHOS 58  --   BILITOT 0.5  --     Cardiac Enzymes  Recent Labs Lab 08/06/16 1348  TROPONINI 0.03*    Microbiology Results   No results found for this or any previous visit.  RADIOLOGY:  Dg Chest 2 View  Result Date: 08/06/2016 CLINICAL DATA:  COPD, shortness of Breath EXAM: CHEST  2 VIEW COMPARISON:  03/12/2015 FINDINGS: Cardiomediastinal silhouette is stable. Scarring in right costophrenic angle again noted. No infiltrate or pleural effusion. No pulmonary edema. Hyperinflation again noted. Osteopenia and mild degenerative changes  thoracic spine. Moderate size hiatal hernia. IMPRESSION: Scarring in right costophrenic angle again noted. No infiltrate or pleural effusion. No pulmonary edema. Hyperinflation again noted. Osteopenia and mild degenerative changes thoracic spine. Moderate size hiatal hernia. Electronically Signed   By: Natasha MeadLiviu  Pop M.D.   On: 08/06/2016 14:24   Ct Angio Chest Pe W Or Wo Contrast  Result Date: 08/06/2016 CLINICAL DATA:  Short of breath. EXAM: CT ANGIOGRAPHY CHEST WITH CONTRAST TECHNIQUE: Multidetector CT imaging of the chest was performed using the standard protocol during bolus administration of intravenous contrast. Multiplanar CT image reconstructions and MIPs were obtained to evaluate the vascular anatomy. CONTRAST:  60 mL Isovue COMPARISON:  None. FINDINGS: Cardiovascular: No filling defects in the pulmonary arteries to suggest acute pulmonary embolism. Coronary artery calcification and aortic atherosclerotic calcification. No pericardial fluid. Mediastinum/Nodes: No axillary or supraclavicular adenopathy. No mediastinal hilar adenopathy. Esophagus is gas-filled. Lungs/Pleura: Mild centrilobular emphysema in the upper lobes. There is biapical pleural-parenchymal thickening. This linear scarring at the RIGHT lung base. No airspace or ground-glass opacities. No pleural fluid. Airways are normal. Upper Abdomen: Infrarenal abdominal aorta is aneurysmal at 3.7 cm. The aorta is not completely imaged. Heavy intimal calcification. There is a peripherally calcified lesion of the RIGHT adrenal gland  measuring 2.4 by 3.4 cm. This lesion cannot be characterized as benign on this contrast exam. Lesion however is stable in size compared to CT of 06/15/2014 and PET of 2007 consistent benign lesion. No spinal hiatal hernia noted. A simple fluid lesion of the LEFT kidney noted. Musculoskeletal: No aggressive osseous lesion. Review of the MIP images confirms the above findings. IMPRESSION: 1. No acute pulmonary embolism. 2. No acute pulmonary parenchymal findings. 3. Mild centrilobular emphysema and pulmonary scarring. 4. Heavy intimal calcification of the aorta and the coronary arteries. 5. Abdominal aortic aneurysm at 3.7 cm but incompletely imaged. Recommend followup by ultrasound in 2 years. This recommendation follows ACR consensus guidelines: White Paper of the ACR Incidental Findings Committee II on Vascular Findings. J Am Coll Radiol 2013; 10:789-794. Electronically Signed   By: Genevive BiStewart  Edmunds M.D.   On: 08/06/2016 16:24    EKG:   Orders placed or performed during the hospital encounter of 08/06/16  . ED EKG  . ED EKG      Management plans discussed with the patient, family and they are in agreement.  CODE STATUS:     Code Status Orders        Start     Ordered   08/06/16 1741  Full code  Continuous     08/06/16 1744    Code Status History    Date Active Date Inactive Code Status Order ID Comments User Context   This patient has a current code status but no historical code status.      TOTAL TIME TAKING CARE OF THIS PATIENT: 45 minutes.   Note: This dictation was prepared with Dragon dictation along with smaller phrase technology. Any transcriptional errors that result from this process are unintentional.   @M[facility] @  on 08/08/2016 at 12:17 PM  Between 7am to 6pm - Pager - [phone]  After 6pm go to [email or web address] - password [email or web address] [facility]  [facility] [place] [facility]  Office  [phone]  CC: Primary care physician; Marguarite ArbourSPARKS,JEFFREY D, MD

## 2016-09-02 DIAGNOSIS — H2511 Age-related nuclear cataract, right eye: Secondary | ICD-10-CM | POA: Diagnosis not present

## 2016-09-13 ENCOUNTER — Encounter: Payer: Self-pay | Admitting: *Deleted

## 2016-09-13 DIAGNOSIS — J449 Chronic obstructive pulmonary disease, unspecified: Secondary | ICD-10-CM | POA: Diagnosis not present

## 2016-09-16 ENCOUNTER — Encounter: Payer: Self-pay | Admitting: *Deleted

## 2016-09-16 ENCOUNTER — Ambulatory Visit: Payer: Medicare HMO | Admitting: Anesthesiology

## 2016-09-16 ENCOUNTER — Ambulatory Visit
Admission: RE | Admit: 2016-09-16 | Discharge: 2016-09-16 | Disposition: A | Discharge status: Home / Self Care | Payer: Medicare HMO | Source: Ambulatory Visit | Attending: Ophthalmology | Admitting: Ophthalmology

## 2016-09-16 ENCOUNTER — Encounter
Admission: RE | Disposition: A | Discharge status: Home / Self Care | Payer: Self-pay | Source: Ambulatory Visit | Attending: Ophthalmology

## 2016-09-16 DIAGNOSIS — J449 Chronic obstructive pulmonary disease, unspecified: Secondary | ICD-10-CM | POA: Insufficient documentation

## 2016-09-16 DIAGNOSIS — I739 Peripheral vascular disease, unspecified: Secondary | ICD-10-CM | POA: Insufficient documentation

## 2016-09-16 DIAGNOSIS — H25041 Posterior subcapsular polar age-related cataract, right eye: Secondary | ICD-10-CM | POA: Diagnosis not present

## 2016-09-16 DIAGNOSIS — H2511 Age-related nuclear cataract, right eye: Secondary | ICD-10-CM | POA: Diagnosis not present

## 2016-09-16 DIAGNOSIS — I10 Essential (primary) hypertension: Secondary | ICD-10-CM | POA: Insufficient documentation

## 2016-09-16 DIAGNOSIS — Z79899 Other long term (current) drug therapy: Secondary | ICD-10-CM | POA: Diagnosis not present

## 2016-09-16 DIAGNOSIS — Z87891 Personal history of nicotine dependence: Secondary | ICD-10-CM | POA: Diagnosis not present

## 2016-09-16 HISTORY — PX: CATARACT EXTRACTION W/PHACO: SHX586

## 2016-09-16 HISTORY — DX: Polyneuropathy, unspecified: G62.9

## 2016-09-16 SURGERY — PHACOEMULSIFICATION, CATARACT, WITH IOL INSERTION
Anesthesia: Monitor Anesthesia Care | Site: Eye | Laterality: Right | Wound class: Clean

## 2016-09-16 MED ORDER — LIDOCAINE HCL (PF) 4 % IJ SOLN
INTRAOCULAR | Status: DC | PRN
  Administered 2016-09-16: 4 mL via OPHTHALMIC

## 2016-09-16 MED ORDER — SODIUM CHLORIDE 0.9 % IV SOLN
INTRAVENOUS | Status: DC
  Administered 2016-09-16 (×2): via INTRAVENOUS

## 2016-09-16 MED ORDER — MOXIFLOXACIN HCL 0.5 % OP SOLN
OPHTHALMIC | Status: AC
  Filled 2016-09-16: qty 3

## 2016-09-16 MED ORDER — SODIUM HYALURONATE 23 MG/ML IO SOLN
INTRAOCULAR | Status: AC
  Filled 2016-09-16: qty 0.6

## 2016-09-16 MED ORDER — SODIUM HYALURONATE 10 MG/ML IO SOLN
INTRAOCULAR | Status: AC
  Filled 2016-09-16: qty 0.85

## 2016-09-16 MED ORDER — TRYPAN BLUE 0.06 % OP SOLN
OPHTHALMIC | Status: AC
  Filled 2016-09-16: qty 0.5

## 2016-09-16 MED ORDER — POVIDONE-IODINE 5 % OP SOLN
OPHTHALMIC | Status: AC
  Filled 2016-09-16: qty 30

## 2016-09-16 MED ORDER — BSS IO SOLN
INTRAOCULAR | Status: DC | PRN
  Administered 2016-09-16: 200 mL via INTRAOCULAR

## 2016-09-16 MED ORDER — MIDAZOLAM HCL 2 MG/2ML IJ SOLN
INTRAMUSCULAR | Status: AC
  Filled 2016-09-16: qty 2

## 2016-09-16 MED ORDER — MOXIFLOXACIN HCL 0.5 % OP SOLN
OPHTHALMIC | Status: DC | PRN
  Administered 2016-09-16: 0.2 mL via OPHTHALMIC

## 2016-09-16 MED ORDER — LIDOCAINE HCL (PF) 4 % IJ SOLN
INTRAMUSCULAR | Status: AC
  Filled 2016-09-16: qty 5

## 2016-09-16 MED ORDER — ARMC OPHTHALMIC DILATING DROPS
1.0000 "application " | OPHTHALMIC | Status: AC
  Administered 2016-09-16 (×3): 1 via OPHTHALMIC

## 2016-09-16 MED ORDER — MOXIFLOXACIN HCL 0.5 % OP SOLN: 1.0000 [drp] | OPHTHALMIC | Status: DC | PRN

## 2016-09-16 MED ORDER — EPINEPHRINE PF 1 MG/ML IJ SOLN
INTRAMUSCULAR | Status: AC
  Filled 2016-09-16: qty 1

## 2016-09-16 MED ORDER — TRYPAN BLUE 0.06 % OP SOLN
OPHTHALMIC | Status: DC | PRN
  Administered 2016-09-16: 0.5 mL via INTRAOCULAR

## 2016-09-16 MED ORDER — MIDAZOLAM HCL 2 MG/2ML IJ SOLN
INTRAMUSCULAR | Status: DC | PRN
  Administered 2016-09-16 (×2): 0.5 mg via INTRAVENOUS

## 2016-09-16 MED ORDER — ALFENTANIL 500 MCG/ML IJ INJ
INJECTION | INTRAMUSCULAR | Status: DC | PRN
  Administered 2016-09-16: 250 ug via INTRAVENOUS

## 2016-09-16 MED ORDER — SODIUM HYALURONATE 10 MG/ML IO SOLN
INTRAOCULAR | Status: DC | PRN
  Administered 2016-09-16: 0.55 mL via INTRAOCULAR

## 2016-09-16 MED ORDER — SODIUM HYALURONATE 23 MG/ML IO SOLN
INTRAOCULAR | Status: DC | PRN
  Administered 2016-09-16: 0.6 mL via INTRAOCULAR

## 2016-09-16 MED ORDER — ARMC OPHTHALMIC DILATING DROPS
OPHTHALMIC | Status: AC
  Administered 2016-09-16: 1 via OPHTHALMIC
  Filled 2016-09-16: qty 0.4

## 2016-09-16 SURGICAL SUPPLY — 20 items
CANNULA ANT/CHMB 27GA (MISCELLANEOUS) ×6 IMPLANT
CUP MEDICINE 2OZ PLAST GRAD ST (MISCELLANEOUS) ×3 IMPLANT
DISSECTOR HYDRO NUCLEUS 50X22 (MISCELLANEOUS) ×3 IMPLANT
GLOVE BIO SURGEON STRL SZ8 (GLOVE) ×3 IMPLANT
GLOVE BIOGEL M 6.5 STRL (GLOVE) ×3 IMPLANT
GLOVE SURG LX 7.5 STRW (GLOVE) ×2
GLOVE SURG LX STRL 7.5 STRW (GLOVE) ×1 IMPLANT
GOWN STRL REUS W/ TWL LRG LVL3 (GOWN DISPOSABLE) ×2 IMPLANT
GOWN STRL REUS W/TWL LRG LVL3 (GOWN DISPOSABLE) ×4
LENS IOL TECNIS ITEC 24.5 (Intraocular Lens) ×3 IMPLANT
PACK CATARACT (MISCELLANEOUS) ×3 IMPLANT
PACK CATARACT KING (MISCELLANEOUS) ×3 IMPLANT
PACK EYE AFTER SURG (MISCELLANEOUS) ×3 IMPLANT
SOL BSS BAG (MISCELLANEOUS) ×3
SOLUTION BSS BAG (MISCELLANEOUS) ×1 IMPLANT
SYR 3ML LL SCALE MARK (SYRINGE) ×6 IMPLANT
SYR 5ML LL (SYRINGE) ×3 IMPLANT
SYR TB 1ML 27GX1/2 LL (SYRINGE) ×3 IMPLANT
WATER STERILE IRR 250ML POUR (IV SOLUTION) ×3 IMPLANT
WIPE NON LINTING 3.25X3.25 (MISCELLANEOUS) ×3 IMPLANT

## 2016-09-16 NOTE — Anesthesia Post-op Follow-up Note (Cosign Needed)
Anesthesia QCDR form completed.        

## 2016-09-16 NOTE — Transfer of Care (Signed)
Immediate Anesthesia Transfer of Care Note  Patient: Donna Quinn  Procedure(s) Performed: Procedure(s) with comments: CATARACT EXTRACTION PHACO AND INTRAOCULAR LENS PLACEMENT (IOC) (Right) - Lot # [id number] H [place]: 01"21.7 AP% 16.8: CDE: 13.90  Patient Location: PACU and Short Stay  Anesthesia Type:MAC  Level of Consciousness: awake, oriented and patient cooperative  Airway & Oxygen Therapy: Patient Spontanous Breathing  Post-op Assessment: Report given to RN and Post -op Vital signs reviewed and stable  Post vital signs: Reviewed and stable  Last Vitals:  Vitals:   09/16/16 0810  BP: (!) 163/66  Pulse: 83  Resp: 16  Temp: (!) 36.1 C    Last Pain:  Vitals:   09/16/16 0810  TempSrc: Tympanic         Complications: No apparent anesthesia complications

## 2016-09-16 NOTE — H&P (Signed)
The History and Physical notes are on paper, have been signed, and are to be scanned. The patient remains stable and unchanged from the H&P.   Previous H&P reviewed, patient examined, and there are no changes.  Donna Quinn 09/16/2016 9:26 AM

## 2016-09-16 NOTE — Op Note (Signed)
OPERATIVE NOTE  Donna GildingBetty M Lacey [id number] 09/16/2016   PREOPERATIVE DIAGNOSIS:   1.  Dense Nuclear sclerotic cataract right eye.  H25.11 2.  Pseudoexfoliation syndrome.   POSTOPERATIVE DIAGNOSIS:     1.  Nuclear sclerotic cataract right eye.   2.  Pseudoexfoliation syndrome.   PROCEDURE:  Complex Phacoemusification with posterior chamber intraocular lens placement of the right eye, requiring trypan blue for visualization of the anterior capsule.  LENS:   Implant Name Type Inv. Item Serial No. Manufacturer Lot No. LRB No. Used  LENS IOL DIOP 24.5 - [id number] Intraocular Lens LENS IOL DIOP 24.5 [id number] AMO   Right 1       PCB00 +24.5   ULTRASOUND TIME: 1 minutes 21 seconds.  CDE 13.90   SURGEON:  Willey BladeBradley King, MD, MPH  ANESTHESIOLOGIST: Anesthesiologist: Berdine AddisonMathai Thomas, MD CRNA: Charna Busmanhomas Diamond, CRNA   ANESTHESIA:  Topical with tetracaine drops augmented with 1% preservative-free intracameral lidocaine.  ESTIMATED BLOOD LOSS: less than 1 mL.   COMPLICATIONS:  None.   DESCRIPTION OF PROCEDURE:  The patient was identified in the holding room and transported to the operating room and placed in the supine position under the operating microscope.  The right eye was identified as the operative eye and it was prepped and draped in the usual sterile ophthalmic fashion.   A 1.0 millimeter clear-corneal paracentesis was made at the 10:30 position. 0.5 ml of preservative-free 1% lidocaine with epinephrine was injected into the anterior chamber.  There was a poor red reflex, so Trypan blue was instilled under an air bubble and rinsed out with BSS to stain the capsule for improved visualization.   The anterior chamber was filled with Healon 5 viscoelastic.  A 2.4 millimeter keratome was used to make a near-clear corneal incision at the 8:00 position.  A curvilinear capsulorrhexis was made with a cystotome and capsulorrhexis forceps.  Balanced salt solution was used to hydrodissect and  hydrodelineate the nucleus.   Phacoemulsification was then used in stop and chop fashion to remove the lens nucleus and epinucleus.  The remaining cortex was then removed using the irrigation and aspiration handpiece. Healon was then placed into the capsular bag to distend it for lens placement.  A lens was then injected into the capsular bag.  The remaining viscoelastic was aspirated.   Wounds were hydrated with balanced salt solution.  The anterior chamber was inflated to a physiologic pressure with balanced salt solution.    Intracameral vigamox 0.1 mL undiluted was injected into the eye and a drop placed onto the ocular surface.  No wound leaks were noted.  The patient was taken to the recovery room in stable condition without complications of anesthesia or surgery  Willey BladeBradley King 09/16/2016, 10:04 AM

## 2016-09-16 NOTE — Anesthesia Preprocedure Evaluation (Signed)
Anesthesia Evaluation  Patient identified by MRN, date of birth, ID band Patient awake    Reviewed: Allergy & Precautions, NPO status , Patient's Chart, lab work & pertinent test results, reviewed documented beta blocker date and time   Airway Mallampati: II  TM Distance: >3 FB     Dental  (+) Chipped   Pulmonary shortness of breath, COPD, former smoker,           Cardiovascular hypertension, Pt. on medications + Peripheral Vascular Disease       Neuro/Psych CVA    GI/Hepatic   Endo/Other    Renal/GU      Musculoskeletal   Abdominal   Peds  Hematology   Anesthesia Other Findings   Reproductive/Obstetrics                             Anesthesia Physical Anesthesia Plan  ASA: III  Anesthesia Plan: MAC   Post-op Pain Management:    Induction:   Airway Management Planned:   Additional Equipment:   Intra-op Plan:   Post-operative Plan:   Informed Consent: I have reviewed the patients History and Physical, chart, labs and discussed the procedure including the risks, benefits and alternatives for the proposed anesthesia with the patient or authorized representative who has indicated his/her understanding and acceptance.     Plan Discussed with: CRNA  Anesthesia Plan Comments:         Anesthesia Quick Evaluation

## 2016-09-16 NOTE — Discharge Instructions (Signed)
Eye Surgery Discharge Instructions  Expect mild scratchy sensation or mild soreness. DO NOT RUB YOUR EYE!  The day of surgery:  Minimal physical activity, but bed rest is not required  No reading, computer work, or close hand work  No bending, lifting, or straining.  May watch TV  For 24 hours:  No driving, legal decisions, or alcoholic beverages  Safety precautions  Eat anything you prefer: It is better to start with liquids, then soup then solid foods.  _____ Eye patch should be worn until postoperative exam tomorrow.  ____ Solar shield eyeglasses should be worn for comfort in the sunlight/patch while sleeping  Resume all regular medications including aspirin or Coumadin if these were discontinued prior to surgery. You may shower, bathe, shave, or wash your hair. Tylenol may be taken for mild discomfort.  Call your doctor if you experience significant pain, nausea, or vomiting, fever > 101 or other signs of infection. [phone] or [phone] Specific instructions:  Follow-up Information    Willey BladeBradley King, MD Follow up on 09/17/2016.   Specialty:  Ophthalmology Why:  10:15 Contact information: [address] [place] [address] [phone]

## 2016-09-20 NOTE — Anesthesia Postprocedure Evaluation (Signed)
Anesthesia Post Note  Patient: Donna Quinn  Procedure(s) Performed: Procedure(s) (LRB): CATARACT EXTRACTION PHACO AND INTRAOCULAR LENS PLACEMENT (IOC) (Right)  Patient location during evaluation: PACU Anesthesia Type: MAC Level of consciousness: awake and alert Pain management: pain level controlled Vital Signs Assessment: post-procedure vital signs reviewed and stable Respiratory status: spontaneous breathing, nonlabored ventilation, respiratory function stable and patient connected to nasal cannula oxygen Cardiovascular status: stable and blood pressure returned to baseline Anesthetic complications: no     Last Vitals:  Vitals:   09/16/16 1007 09/16/16 1027  BP: (!) 168/83 (!) 152/55  Pulse: 78 79  Resp: (!) 1 16  Temp: 36.9 C     Last Pain:  Vitals:   09/17/16 0818  TempSrc:   PainSc: 0-No pain                 Celia Friedland S

## 2016-10-05 DIAGNOSIS — R7309 Other abnormal glucose: Secondary | ICD-10-CM | POA: Diagnosis not present

## 2016-10-05 DIAGNOSIS — J431 Panlobular emphysema: Secondary | ICD-10-CM | POA: Diagnosis not present

## 2016-10-05 DIAGNOSIS — I739 Peripheral vascular disease, unspecified: Secondary | ICD-10-CM | POA: Diagnosis not present

## 2016-10-05 DIAGNOSIS — Z79899 Other long term (current) drug therapy: Secondary | ICD-10-CM | POA: Diagnosis not present

## 2016-10-05 DIAGNOSIS — E7800 Pure hypercholesterolemia, unspecified: Secondary | ICD-10-CM | POA: Diagnosis not present

## 2016-10-05 DIAGNOSIS — F325 Major depressive disorder, single episode, in full remission: Secondary | ICD-10-CM | POA: Diagnosis not present

## 2016-10-05 DIAGNOSIS — I10 Essential (primary) hypertension: Secondary | ICD-10-CM | POA: Diagnosis not present

## 2016-10-06 DIAGNOSIS — I739 Peripheral vascular disease, unspecified: Secondary | ICD-10-CM | POA: Diagnosis not present

## 2016-10-06 DIAGNOSIS — R7309 Other abnormal glucose: Secondary | ICD-10-CM | POA: Diagnosis not present

## 2016-10-06 DIAGNOSIS — E7800 Pure hypercholesterolemia, unspecified: Secondary | ICD-10-CM | POA: Diagnosis not present

## 2016-10-06 DIAGNOSIS — I10 Essential (primary) hypertension: Secondary | ICD-10-CM | POA: Diagnosis not present

## 2016-10-06 DIAGNOSIS — J431 Panlobular emphysema: Secondary | ICD-10-CM | POA: Diagnosis not present

## 2016-10-06 DIAGNOSIS — Z79899 Other long term (current) drug therapy: Secondary | ICD-10-CM | POA: Diagnosis not present

## 2016-10-06 DIAGNOSIS — F325 Major depressive disorder, single episode, in full remission: Secondary | ICD-10-CM | POA: Diagnosis not present

## 2016-10-19 ENCOUNTER — Emergency Department: Payer: Medicare HMO

## 2016-10-19 ENCOUNTER — Inpatient Hospital Stay
Admission: EM | Admit: 2016-10-19 | Discharge: 2016-10-22 | DRG: 470 | Disposition: A | Discharge status: Home / Self Care | Payer: Medicare HMO | Attending: Internal Medicine | Admitting: Internal Medicine

## 2016-10-19 ENCOUNTER — Encounter: Payer: Self-pay | Admitting: Emergency Medicine

## 2016-10-19 DIAGNOSIS — J209 Acute bronchitis, unspecified: Secondary | ICD-10-CM | POA: Diagnosis not present

## 2016-10-19 DIAGNOSIS — G629 Polyneuropathy, unspecified: Secondary | ICD-10-CM | POA: Diagnosis present

## 2016-10-19 DIAGNOSIS — J449 Chronic obstructive pulmonary disease, unspecified: Secondary | ICD-10-CM | POA: Diagnosis present

## 2016-10-19 DIAGNOSIS — R262 Difficulty in walking, not elsewhere classified: Secondary | ICD-10-CM | POA: Diagnosis not present

## 2016-10-19 DIAGNOSIS — Z7902 Long term (current) use of antithrombotics/antiplatelets: Secondary | ICD-10-CM

## 2016-10-19 DIAGNOSIS — R8290 Unspecified abnormal findings in urine: Secondary | ICD-10-CM | POA: Diagnosis not present

## 2016-10-19 DIAGNOSIS — Z880 Allergy status to penicillin: Secondary | ICD-10-CM | POA: Diagnosis not present

## 2016-10-19 DIAGNOSIS — W1830XA Fall on same level, unspecified, initial encounter: Secondary | ICD-10-CM | POA: Diagnosis present

## 2016-10-19 DIAGNOSIS — S72041A Displaced fracture of base of neck of right femur, initial encounter for closed fracture: Secondary | ICD-10-CM | POA: Diagnosis not present

## 2016-10-19 DIAGNOSIS — J440 Chronic obstructive pulmonary disease with acute lower respiratory infection: Secondary | ICD-10-CM | POA: Diagnosis not present

## 2016-10-19 DIAGNOSIS — Z87891 Personal history of nicotine dependence: Secondary | ICD-10-CM

## 2016-10-19 DIAGNOSIS — Z4682 Encounter for fitting and adjustment of non-vascular catheter: Secondary | ICD-10-CM | POA: Diagnosis not present

## 2016-10-19 DIAGNOSIS — E785 Hyperlipidemia, unspecified: Secondary | ICD-10-CM | POA: Diagnosis present

## 2016-10-19 DIAGNOSIS — R0902 Hypoxemia: Secondary | ICD-10-CM | POA: Diagnosis not present

## 2016-10-19 DIAGNOSIS — M80051D Age-related osteoporosis with current pathological fracture, right femur, subsequent encounter for fracture with routine healing: Secondary | ICD-10-CM | POA: Diagnosis not present

## 2016-10-19 DIAGNOSIS — S299XXA Unspecified injury of thorax, initial encounter: Secondary | ICD-10-CM | POA: Diagnosis not present

## 2016-10-19 DIAGNOSIS — M6281 Muscle weakness (generalized): Secondary | ICD-10-CM | POA: Diagnosis not present

## 2016-10-19 DIAGNOSIS — Z96641 Presence of right artificial hip joint: Secondary | ICD-10-CM | POA: Diagnosis not present

## 2016-10-19 DIAGNOSIS — M25551 Pain in right hip: Secondary | ICD-10-CM | POA: Diagnosis not present

## 2016-10-19 DIAGNOSIS — Z6825 Body mass index (BMI) 25.0-25.9, adult: Secondary | ICD-10-CM

## 2016-10-19 DIAGNOSIS — E669 Obesity, unspecified: Secondary | ICD-10-CM | POA: Diagnosis present

## 2016-10-19 DIAGNOSIS — M359 Systemic involvement of connective tissue, unspecified: Secondary | ICD-10-CM | POA: Diagnosis present

## 2016-10-19 DIAGNOSIS — W19XXXA Unspecified fall, initial encounter: Secondary | ICD-10-CM

## 2016-10-19 DIAGNOSIS — Z882 Allergy status to sulfonamides status: Secondary | ICD-10-CM

## 2016-10-19 DIAGNOSIS — Z471 Aftercare following joint replacement surgery: Secondary | ICD-10-CM | POA: Diagnosis not present

## 2016-10-19 DIAGNOSIS — K219 Gastro-esophageal reflux disease without esophagitis: Secondary | ICD-10-CM | POA: Diagnosis present

## 2016-10-19 DIAGNOSIS — S72001A Fracture of unspecified part of neck of right femur, initial encounter for closed fracture: Principal | ICD-10-CM | POA: Diagnosis present

## 2016-10-19 DIAGNOSIS — S0003XA Contusion of scalp, initial encounter: Secondary | ICD-10-CM | POA: Diagnosis not present

## 2016-10-19 DIAGNOSIS — Z7401 Bed confinement status: Secondary | ICD-10-CM | POA: Diagnosis not present

## 2016-10-19 DIAGNOSIS — Z79899 Other long term (current) drug therapy: Secondary | ICD-10-CM | POA: Diagnosis not present

## 2016-10-19 DIAGNOSIS — S72091A Other fracture of head and neck of right femur, initial encounter for closed fracture: Secondary | ICD-10-CM | POA: Diagnosis not present

## 2016-10-19 DIAGNOSIS — I10 Essential (primary) hypertension: Secondary | ICD-10-CM | POA: Diagnosis not present

## 2016-10-19 DIAGNOSIS — I739 Peripheral vascular disease, unspecified: Secondary | ICD-10-CM | POA: Diagnosis not present

## 2016-10-19 DIAGNOSIS — Z96649 Presence of unspecified artificial hip joint: Secondary | ICD-10-CM

## 2016-10-19 DIAGNOSIS — E7800 Pure hypercholesterolemia, unspecified: Secondary | ICD-10-CM | POA: Diagnosis present

## 2016-10-19 DIAGNOSIS — Z978 Presence of other specified devices: Secondary | ICD-10-CM

## 2016-10-19 DIAGNOSIS — Z9181 History of falling: Secondary | ICD-10-CM | POA: Diagnosis not present

## 2016-10-19 DIAGNOSIS — S72009A Fracture of unspecified part of neck of unspecified femur, initial encounter for closed fracture: Secondary | ICD-10-CM | POA: Diagnosis not present

## 2016-10-19 DIAGNOSIS — M810 Age-related osteoporosis without current pathological fracture: Secondary | ICD-10-CM | POA: Diagnosis present

## 2016-10-19 DIAGNOSIS — I714 Abdominal aortic aneurysm, without rupture: Secondary | ICD-10-CM | POA: Diagnosis present

## 2016-10-19 DIAGNOSIS — S0990XA Unspecified injury of head, initial encounter: Secondary | ICD-10-CM | POA: Diagnosis not present

## 2016-10-19 DIAGNOSIS — S72031A Displaced midcervical fracture of right femur, initial encounter for closed fracture: Secondary | ICD-10-CM | POA: Diagnosis not present

## 2016-10-19 DIAGNOSIS — Z888 Allergy status to other drugs, medicaments and biological substances status: Secondary | ICD-10-CM

## 2016-10-19 DIAGNOSIS — Z8673 Personal history of transient ischemic attack (TIA), and cerebral infarction without residual deficits: Secondary | ICD-10-CM | POA: Diagnosis not present

## 2016-10-19 HISTORY — DX: Gastro-esophageal reflux disease without esophagitis: K21.9

## 2016-10-19 LAB — CBC WITH DIFFERENTIAL/PLATELET
BASOS ABS: 0 10*3/uL (ref 0–0.1)
Basophils Relative: 0 %
EOS PCT: 1 %
Eosinophils Absolute: 0.1 10*3/uL (ref 0–0.7)
HCT: 37.4 % (ref 35.0–47.0)
Hemoglobin: 12.5 g/dL (ref 12.0–16.0)
LYMPHS PCT: 8 %
Lymphs Abs: 0.8 10*3/uL — ABNORMAL LOW (ref 1.0–3.6)
MCH: 29.1 pg (ref 26.0–34.0)
MCHC: 33.4 g/dL (ref 32.0–36.0)
MCV: 87.3 fL (ref 80.0–100.0)
Monocytes Absolute: 0.6 10*3/uL (ref 0.2–0.9)
Monocytes Relative: 6 %
Neutro Abs: 7.7 10*3/uL — ABNORMAL HIGH (ref 1.4–6.5)
Neutrophils Relative %: 85 %
PLATELETS: 201 10*3/uL (ref 150–440)
RBC: 4.28 MIL/uL (ref 3.80–5.20)
RDW: 15.4 % — ABNORMAL HIGH (ref 11.5–14.5)
WBC: 9.2 10*3/uL (ref 3.6–11.0)

## 2016-10-19 LAB — BASIC METABOLIC PANEL
Anion gap: 8 (ref 5–15)
BUN: 22 mg/dL — ABNORMAL HIGH (ref 6–20)
CO2: 26 mmol/L (ref 22–32)
Calcium: 9.2 mg/dL (ref 8.9–10.3)
Chloride: 104 mmol/L (ref 101–111)
Creatinine, Ser: 1.11 mg/dL — ABNORMAL HIGH (ref 0.44–1.00)
GFR calc Af Amer: 54 mL/min — ABNORMAL LOW (ref >60)
GFR, EST NON AFRICAN AMERICAN: 46 mL/min — AB (ref >60)
GLUCOSE: 131 mg/dL — AB (ref 65–99)
POTASSIUM: 4.4 mmol/L (ref 3.5–5.1)
SODIUM: 138 mmol/L (ref 135–145)

## 2016-10-19 LAB — PROTIME-INR
INR: 0.94
PROTHROMBIN TIME: 12.6 s (ref 11.4–15.2)

## 2016-10-19 LAB — TYPE AND SCREEN
ABO/RH(D): A POS
Antibody Screen: NEGATIVE

## 2016-10-19 LAB — APTT: APTT: 40 s — AB (ref 24–36)

## 2016-10-19 MED ORDER — DIPHENHYDRAMINE HCL 25 MG PO CAPS: 25.0000 mg | ORAL_CAPSULE | Freq: Every evening | ORAL | Status: DC | PRN

## 2016-10-19 MED ORDER — MORPHINE SULFATE (PF) 4 MG/ML IV SOLN: 4.0000 mg | INTRAVENOUS | Status: DC | PRN

## 2016-10-19 MED ORDER — MORPHINE SULFATE (PF) 2 MG/ML IV SOLN: 2.0000 mg | INTRAVENOUS | Status: DC | PRN

## 2016-10-19 MED ORDER — CLINDAMYCIN PHOSPHATE 600 MG/50ML IV SOLN
600.0000 mg | Freq: Once | INTRAVENOUS | Status: DC
  Filled 2016-10-19: qty 50

## 2016-10-19 MED ORDER — MOMETASONE FURO-FORMOTEROL FUM 200-5 MCG/ACT IN AERO
2.0000 | INHALATION_SPRAY | Freq: Two times a day (BID) | RESPIRATORY_TRACT | Status: DC
  Administered 2016-10-19 – 2016-10-22 (×4): 2 via RESPIRATORY_TRACT
  Filled 2016-10-19 (×2): qty 8.8

## 2016-10-19 MED ORDER — OXYCODONE HCL 5 MG PO TABS
5.0000 mg | ORAL_TABLET | ORAL | Status: DC | PRN
Start: 2016-10-19 — End: 2016-10-20
  Administered 2016-10-19: 5 mg via ORAL
  Filled 2016-10-19: qty 1

## 2016-10-19 MED ORDER — ATORVASTATIN CALCIUM 20 MG PO TABS
40.0000 mg | ORAL_TABLET | Freq: Every evening | ORAL | Status: DC
  Administered 2016-10-19 – 2016-10-21 (×2): 40 mg via ORAL
  Filled 2016-10-19 (×2): qty 2

## 2016-10-19 MED ORDER — ALBUTEROL SULFATE (2.5 MG/3ML) 0.083% IN NEBU: 3.0000 mL | INHALATION_SOLUTION | Freq: Four times a day (QID) | RESPIRATORY_TRACT | Status: DC | PRN

## 2016-10-19 MED ORDER — ONDANSETRON HCL 4 MG PO TABS: 4.0000 mg | ORAL_TABLET | Freq: Four times a day (QID) | ORAL | Status: DC | PRN

## 2016-10-19 MED ORDER — LOSARTAN POTASSIUM 50 MG PO TABS
50.0000 mg | ORAL_TABLET | Freq: Every day | ORAL | Status: DC
  Administered 2016-10-19 – 2016-10-22 (×3): 50 mg via ORAL
  Filled 2016-10-19 (×3): qty 1

## 2016-10-19 MED ORDER — PANTOPRAZOLE SODIUM 40 MG PO TBEC
40.0000 mg | DELAYED_RELEASE_TABLET | Freq: Every day | ORAL | Status: DC
  Administered 2016-10-19 – 2016-10-22 (×3): 40 mg via ORAL
  Filled 2016-10-19 (×3): qty 1

## 2016-10-19 MED ORDER — SODIUM CHLORIDE 0.9 % IV SOLN
INTRAVENOUS | Status: DC
  Administered 2016-10-19: 22:00:00 via INTRAVENOUS

## 2016-10-19 MED ORDER — OXYCODONE HCL 5 MG PO TABS
5.0000 mg | ORAL_TABLET | ORAL | Status: DC | PRN
  Administered 2016-10-20 (×2): 10 mg via ORAL
  Filled 2016-10-19 (×2): qty 2

## 2016-10-19 MED ORDER — GABAPENTIN 300 MG PO CAPS
300.0000 mg | ORAL_CAPSULE | Freq: Two times a day (BID) | ORAL | Status: DC
  Administered 2016-10-19 – 2016-10-22 (×4): 300 mg via ORAL
  Filled 2016-10-19 (×4): qty 1

## 2016-10-19 MED ORDER — ACETAMINOPHEN 325 MG PO TABS: 650.0000 mg | ORAL_TABLET | Freq: Four times a day (QID) | ORAL | Status: DC | PRN

## 2016-10-19 MED ORDER — HYDROMORPHONE HCL 1 MG/ML IJ SOLN
0.5000 mg | INTRAMUSCULAR | Status: DC | PRN
  Administered 2016-10-19 – 2016-10-20 (×3): 0.5 mg via INTRAVENOUS
  Filled 2016-10-19 (×3): qty 1

## 2016-10-19 MED ORDER — MORPHINE SULFATE (PF) 4 MG/ML IV SOLN
4.0000 mg | Freq: Once | INTRAVENOUS | Status: DC
  Administered 2016-10-19: 4 mg via INTRAVENOUS
  Filled 2016-10-19: qty 1

## 2016-10-19 MED ORDER — HYDRALAZINE HCL 25 MG PO TABS
25.0000 mg | ORAL_TABLET | Freq: Two times a day (BID) | ORAL | Status: DC
  Administered 2016-10-19 – 2016-10-22 (×4): 25 mg via ORAL
  Filled 2016-10-19 (×5): qty 1

## 2016-10-19 MED ORDER — ACETAMINOPHEN 650 MG RE SUPP: 650.0000 mg | Freq: Four times a day (QID) | RECTAL | Status: DC | PRN

## 2016-10-19 MED ORDER — ONDANSETRON HCL 4 MG/2ML IJ SOLN
4.0000 mg | Freq: Once | INTRAMUSCULAR | Status: AC
  Administered 2016-10-19: 4 mg via INTRAVENOUS
  Filled 2016-10-19: qty 2

## 2016-10-19 MED ORDER — ONDANSETRON HCL 4 MG/2ML IJ SOLN: 4.0000 mg | Freq: Four times a day (QID) | INTRAMUSCULAR | Status: DC | PRN

## 2016-10-19 NOTE — ED Triage Notes (Signed)
Patient comes in from home via [facility] for a fall. Per EMS fall happened around 500pm, daughter found her at 6:30pm. Patient does have noticeable rotation and shortening of right leg and a hematoma on her right forehead. Patient states she hit her head but denies LOC. Patient is on plavix. Per EMS patient got Fentanyl en route. Patient has hx of CVA, brain aneurysm, AAA, HTN, COPD.

## 2016-10-19 NOTE — ED Provider Notes (Signed)
[facility] Emergency Department Provider Note  ____________________________________________   First MD Initiated Contact with Patient 10/19/16 1919     (approximate)  I have reviewed the triage vital signs and the nursing notes.   HISTORY  Chief Complaint Fall    HPI Donna Quinn is a 81 y.o. female who is a vasculopath with a history of an abdominal aortic aneurysm as well as peripheral vascular disease with vascular stenting who is on Plavix and presenting with right hip pain. The patient said that about an hour prior to arrival she lost her balance and fell onto her right foot. She also hit the lateral aspect of her forehead. She denies losing consciousness at this time. Denies any neck pain.    Past Medical History:  Diagnosis Date  . AAA (abdominal aortic aneurysm) (HCC)   . AAA (abdominal aortic aneurysm) (HCC)   . Abdominal aortic aneurysm (AAA) (HCC)   . Brain aneurysm   . Collagen vascular disease (HCC)   . COPD (chronic obstructive pulmonary disease) (HCC)   . Dyspnea   . GERD (gastroesophageal reflux disease)   . H/O wheezing   . Hypercholesterolemia   . Hypertension   . Neuropathy (HCC)   . Osteoporosis   . Stroke [facility]) 2016   twice    Patient Active Problem List   Diagnosis Date Noted  . Fracture of femoral neck, right (HCC) 10/19/2016  . HTN (hypertension) 10/19/2016  . HLD (hyperlipidemia) 10/19/2016  . COPD with acute bronchitis (HCC) 10/19/2016  . GERD (gastroesophageal reflux disease) 10/19/2016  . COPD exacerbation (HCC) 08/06/2016    Past Surgical History:  Procedure Laterality Date  . abdominal Bilateral    stents  . BACK SURGERY     cyst removed from back  . BRAIN SURGERY     Brain aneurysm repair, coiling  . CAROTID ENDARTERECTOMY    . CATARACT EXTRACTION W/PHACO Left 07/08/2016   Procedure: CATARACT EXTRACTION PHACO AND INTRAOCULAR LENS PLACEMENT (IOC);  Surgeon: Nevada Crane, MD;  Location: [facility]  ORS;  Service: Ophthalmology;  Laterality: Left;  [place]  01:30 AP% 39.1 CDE 20.3 Fluid pack lot # [id number]  . CATARACT EXTRACTION W/PHACO Right 09/16/2016   Procedure: CATARACT EXTRACTION PHACO AND INTRAOCULAR LENS PLACEMENT (IOC);  Surgeon: Nevada Crane, MD;  Location: [facility] ORS;  Service: Ophthalmology;  Laterality: Right;  Lot # [id number] H [place]: 01"21.7 AP% 16.8: CDE: 13.90    Prior to Admission medications   Medication Sig Start Date End Date Taking? Authorizing Provider  acetaminophen (TYLENOL) 500 MG tablet Take 1,000 mg by mouth daily as needed for moderate pain or headache.   Yes Historical Provider, MD  albuterol (PROVENTIL HFA;VENTOLIN HFA) 108 (90 Base) MCG/ACT inhaler Inhale 2 puffs into the lungs every 6 (six) hours as needed for wheezing or shortness of breath.   Yes Historical Provider, MD  atorvastatin (LIPITOR) 40 MG tablet Take 40 mg by mouth every evening.    Yes Historical Provider, MD  budesonide-formoterol (SYMBICORT) 160-4.5 MCG/ACT inhaler Inhale 2 puffs into the lungs 2 (two) times daily.   Yes Historical Provider, MD  Cholecalciferol 2000 units CAPS Take 2,000 Units by mouth every morning.    Yes Historical Provider, MD  clopidogrel (PLAVIX) 75 MG tablet Take 75 mg by mouth every morning.   Yes Historical Provider, MD  diphenhydramine-acetaminophen (TYLENOL PM) 25-500 MG TABS tablet Take 2 tablets by mouth at bedtime as needed (sleep).   Yes Historical Provider, MD  gabapentin (NEURONTIN) 300  MG capsule Take 300 mg by mouth 2 (two) times daily.   Yes Historical Provider, MD  hydrALAZINE (APRESOLINE) 25 MG tablet Take 25 mg by mouth 2 (two) times daily.    Yes Historical Provider, MD  losartan (COZAAR) 50 MG tablet Take 50 mg by mouth daily.    Yes Historical Provider, MD  omeprazole (PRILOSEC) 20 MG capsule Take 20 mg by mouth 2 (two) times daily before a meal.   Yes Historical Provider, MD  vitamin B-12 (CYANOCOBALAMIN) 1000 MCG tablet Take 1,000 mcg by mouth daily.    Yes Historical Provider, MD  polyethylene glycol (MIRALAX / GLYCOLAX) packet Take 17 g by mouth daily as needed for mild constipation. Patient not taking: Reported on 10/19/2016 08/08/16   Ramonita Lab, MD    Allergies Aggrenox [aspirin-dipyridamole er]; Augmentin [amoxicillin-pot clavulanate]; Penicillins; Remeron [mirtazapine]; and Sulfa antibiotics  Family History  Problem Relation Age of Onset  . Breast cancer Sister 87    Social History Social History  Substance Use Topics  . Smoking status: Former Games developer  . Smokeless tobacco: Never Used  . Alcohol use No    Review of Systems Constitutional: No fever/chills Eyes: No visual changes. ENT: No sore throat. Cardiovascular: Denies chest pain. Respiratory: Denies shortness of breath. Gastrointestinal: No abdominal pain.  No nausea, no vomiting.  No diarrhea.  No constipation. Genitourinary: Negative for dysuria. Musculoskeletal: Negative for back pain. Skin: Negative for rash. Neurological: Negative for headaches, focal weakness or numbness.  10-point ROS otherwise negative.  ____________________________________________   PHYSICAL EXAM:  VITAL SIGNS: ED Triage Vitals  Enc Vitals Group     BP 10/19/16 1909 (!) 184/103     Pulse Rate 10/19/16 1909 97     Resp 10/19/16 1909 15     Temp 10/19/16 1909 98.1 F (36.7 C)     Temp Source 10/19/16 1909 Oral     SpO2 10/19/16 1909 98 %     Weight 10/19/16 1912 140 lb (63.5 kg)     Height 10/19/16 1912 5\' 6"  (1.676 m)     Head Circumference --      Peak Flow --      Pain Score 10/19/16 1912 10     Pain Loc --      Pain Edu? --      Excl. in GC? --     Constitutional: Alert and oriented.  in no acute distress. Eyes: Conjunctivae are normal. PERRL. EOMI. Head: 2 cm, cephalohematoma to the right forehead. No bogginess or depression. Mild tenderness to palpation. Nose: No congestion/rhinnorhea. Mouth/Throat: Mucous membranes are moist.  Neck: No stridor.  Tenderness to  palpation to the midline cervical spine. No deformity or step-off. Cardiovascular: Normal rate, regular rhythm. Grossly normal heart sounds.   Respiratory: Normal respiratory effort.  No retractions. Lungs CTAB. Gastrointestinal: Soft and nontender. No distention.  Musculoskeletal: Right lower extremity shortened and externally rotated. Tenderness palpation to the anterior right hip. Pulses intact to the bilateral dorsalis pedis pulse. Patient able to range her toes and is sensate to light touch. Neurologic:  Normal speech and language. No gross focal neurologic deficits are appreciated.  Skin:  Skin is warm, dry and intact. No rash noted. Psychiatric: Mood and affect are normal. Speech and behavior are normal.  ____________________________________________   LABS (all labs ordered are listed, but only abnormal results are displayed)  Labs Reviewed  CBC WITH DIFFERENTIAL/PLATELET - Abnormal; Notable for the following:       Result Value   RDW 15.4 (*)  Neutro Abs 7.7 (*)    Lymphs Abs 0.8 (*)    All other components within normal limits  BASIC METABOLIC PANEL - Abnormal; Notable for the following:    Glucose, Bld 131 (*)    BUN 22 (*)    Creatinine, Ser 1.11 (*)    GFR calc non Af Amer 46 (*)    GFR calc Af Amer 54 (*)    All other components within normal limits  APTT - Abnormal; Notable for the following:    aPTT 40 (*)    All other components within normal limits  PROTIME-INR  TYPE AND SCREEN   ____________________________________________  EKG  ED ECG REPORT I, Arelia Longest, the attending physician, personally viewed and interpreted this ECG.   Date: 10/19/2016  EKG Time: 1910  Rate: 93  Rhythm: normal sinus rhythm  Axis: Normal  Intervals: Left anterior fascicular block.  ST&T Change: No ST segment elevation or depression. No abnormal T-wave inversion.  ____________________________________________  RADIOLOGY  CT Head Wo Contrast (Accession [id number])  (Order [id number])  Imaging  Date: 10/19/2016 Department: [facility] EMERGENCY DEPARTMENT Released By/Authorizing: Myrna Blazer, MD (auto-released)  Exam Information   Status Exam Begun  Exam Ended   Final [99] 10/19/2016 7:47 PM 10/19/2016 7:48 PM  PACS Images   Show images for CT Head Wo Contrast  Study Result   CLINICAL DATA:  81 year old female status post fall today at 1700 hours, found down at 1830 hours. Right hip fracture. On Plavix. Initial encounter.  EXAM: CT HEAD WITHOUT CONTRAST  TECHNIQUE: Contiguous axial images were obtained from the base of the skull through the vertex without intravenous contrast.  COMPARISON:  Head CT without contrast 10/21/2014  FINDINGS: Brain: Left ICA terminus chronic embolization coil pack with streak artifact.  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 Patchy and confluent bilateral cerebral white matter hypodensity.  No midline shift, mass effect, or evidence of intracranial mass lesion. No acute intracranial hemorrhage identified. No ventriculomegaly. No cortically based acute infarct identified.  Vascular: Calcified atherosclerosis at the skull base.  Skull: Intact.  No acute osseous abnormality identified.  Sinuses/Orbits: Visualized paranasal sinuses and mastoids are stable and well pneumatized.  Other: Right forehead scalp hematoma measures up to 10 mm in thickness. Underlying right frontal bone appears intact.  Interval postoperative changes to both globes. Otherwise negative visible orbits soft tissues. No other scalp soft tissue injury identified.  IMPRESSION: 1. Right forehead scalp soft tissue injury without underlying fracture. 2. No acute traumatic injury to the brain. 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   Electronically Signed   By:  Odessa Fleming M.D.   On: 10/19/2016 19:59    DG Chest 1 View (Accession [id number]) (Order [id number])  Imaging  Date: 10/19/2016 Department: [facility] EMERGENCY DEPARTMENT Released By/Authorizing: Myrna Blazer, MD (auto-released)  Exam Information   Status Exam Begun  Exam Ended   Final [99] 10/19/2016 7:30 PM 10/19/2016 7:44 PM  PACS Images   Show images for DG Chest 1 View  Study Result   CLINICAL DATA:  81 year old female status post fall today at 1700 hours, found down at 1830 hours. Right hip fracture. On Plavix. Initial encounter.  EXAM: CHEST 1 VIEW  COMPARISON:  Chest CTA 08/06/2016 and earlier.  FINDINGS: Supine AP view of the chest at 1927 hours. Stable lung volumes. The lungs  are clear. No pneumothorax or pleural effusion identified. Eventration of the diaphragm. Stable cardiac size and mediastinal contours. Small to moderate gastric hiatal hernia. Calcified aortic atherosclerosis. Visualized tracheal air column is within normal limits.  IMPRESSION: No acute cardiopulmonary abnormality.  Moderate gastric hiatal hernia.  Calcified aortic atherosclerosis.   Electronically Signed   By: Odessa FlemingH  Hall M.D.   On: 10/19/2016 19:56    DG Hip Unilat W or Wo Pelvis 2-3 Views Right (Accession [id number]) (Order [id number])  Imaging  Date: 10/19/2016 Department: [facility] EM[facility]GENCY DEPARTMENT Released By/Authorizing: Myrna Blazeravid Matthew Onelia Cadmus, MD (auto-released)  Exam Information   Status Exam Begun  Exam Ended   Final [99] 10/19/2016 7:30 PM 10/19/2016 7:44 PM  PACS Images   Show images for DG Hip Unilat W or Wo Pelvis 2-3 Views Right  Study Result   CLINICAL DATA:  81 year old female status post fall today at 1700 hours, found down at 1830 hours. On Plavix. Initial encounter.  EXAM: DG HIP (WITH OR WITHOUT PELVIS) 2-3V RIGHT  COMPARISON:  None.  FINDINGS: Acute fracture of the right femoral neck  appears to be mildly comminuted and there is varus impaction. The right femoral head remains normally located. The intertrochanteric segment and proximal shaft of the right femur appears to remain intact.  Left femoral head normally located. Grossly intact proximal left femur. No superimposed pelvis fracture identified. Aortoiliac calcified atherosclerosis noted. Common iliac artery vascular stents. Pelvic phleboliths. Negative visible bowel gas pattern.  IMPRESSION: 1. Right femoral neck fracture appears to be mildly comminuted with varus impaction. 2. No other acute fracture about the right hip or pelvis. Right femur intertrochanteric segment appears intact. 3.  Aortoiliac calcified atherosclerosis with vascular stents.   Electronically Signed   By: Odessa FlemingH  Hall M.D.   On: 10/19/2016 19:54     ____________________________________________   PROCEDURES  Procedure(s) performed:   Procedures  Critical Care performed:   ____________________________________________   INITIAL IMPRESSION / ASSESSMENT AND PLAN / ED COURSE  Pertinent labs & imaging results that were available during my care of the patient were reviewed by me and considered in my medical decision making (see chart for details).  ----------------------------------------- 8:56 PM on 10/19/2016 -----------------------------------------  Patient aware of the diagnosis as well as the need for permission to the hospital. Discussed case with Dr. Martha ClanKrasinski orthopedics. Admitted to Dr. Anne HahnWillis of the medicine service. Patient also with pain improved with morphine.      ____________________________________________   FINAL CLINICAL IMPRESSION(S) / ED DIAGNOSES  Fall. Right hip fracture.    NEW MEDICATIONS STARTED DURING THIS VISIT:  New Prescriptions   No medications on file     Note:  This document was prepared using Dragon voice recognition software and may include unintentional dictation errors.      Myrna Blazeravid Matthew Ellory Khurana, MD 10/19/16 [phone]

## 2016-10-19 NOTE — Progress Notes (Signed)
Pt's [facility] do Mrs Donna Quinn [facility]. Dr Martha ClanKrasinski was notified of their decision..Marland Kitchen

## 2016-10-19 NOTE — Consult Note (Signed)
ORTHOPAEDIC CONSULTATION  REQUESTING PHYSICIAN: Oralia Manis, MD  Chief Complaint: Right hip pain status post fall  HPI: Donna Quinn is a 81 y.o. female who complains of right hip pain status post fall at home. Patient unable to stand or ambulate after her injury. She was brought to the [facility] emergency Department x-rays revealed a displaced femoral neck hip fracture. He is seen in the ER this evening with her family at the bedside. Patient complains of significant pain to the right hip.  Past Medical History:  Diagnosis Date  . AAA (abdominal aortic aneurysm) (HCC)   . AAA (abdominal aortic aneurysm) (HCC)   . Abdominal aortic aneurysm (AAA) (HCC)   . Brain aneurysm   . Collagen vascular disease (HCC)   . COPD (chronic obstructive pulmonary disease) (HCC)   . Dyspnea   . GERD (gastroesophageal reflux disease)   . H/O wheezing   . Hypercholesterolemia   . Hypertension   . Neuropathy (HCC)   . Osteoporosis   . Stroke [facility]) 2016   twice   Past Surgical History:  Procedure Laterality Date  . abdominal Bilateral    stents  . BACK SURGERY     cyst removed from back  . BRAIN SURGERY     Brain aneurysm repair, coiling  . CAROTID ENDARTERECTOMY    . CATARACT EXTRACTION W/PHACO Left 07/08/2016   Procedure: CATARACT EXTRACTION PHACO AND INTRAOCULAR LENS PLACEMENT (IOC);  Surgeon: Nevada Crane, MD;  Location: [facility] ORS;  Service: Ophthalmology;  Laterality: Left;  [place]  01:30 AP% 39.1 CDE 20.3 Fluid pack lot # [id number]  . CATARACT EXTRACTION W/PHACO Right 09/16/2016   Procedure: CATARACT EXTRACTION PHACO AND INTRAOCULAR LENS PLACEMENT (IOC);  Surgeon: Nevada Crane, MD;  Location: [facility] ORS;  Service: Ophthalmology;  Laterality: Right;  Lot # [id number] H [place]: 01"21.7 AP% 16.8: CDE: 13.90   Social History   Social History  . Marital status: Widowed    Spouse name: N/A  . Number of children: N/A  . Years of education: N/A   Social History Main Topics  .  Smoking status: Former Games developer  . Smokeless tobacco: Never Used  . Alcohol use No  . Drug use: No  . Sexual activity: Not Asked   Other Topics Concern  . None   Social History Narrative  . None   Family History  Problem Relation Age of Onset  . Breast cancer Sister 34   Allergies  Allergen Reactions  . Aggrenox [Aspirin-Dipyridamole Er]     A really bad headache  . Augmentin [Amoxicillin-Pot Clavulanate]     unknown  . Penicillins Other (See Comments)    Has patient had a PCN reaction causing immediate rash, facial/tongue/throat swelling, SOB or lightheadedness with hypotension: Yes Has patient had a PCN reaction causing severe rash involving mucus membranes or skin necrosis: Yes Has patient had a PCN reaction that required hospitalization No Has patient had a PCN reaction occurring within the last 10 years: No If all of the above answers are "NO", then may proceed with Cephalosporin use.  . Remeron [Mirtazapine]     Broke out inside the mouth  . Sulfa Antibiotics     Broke out in welps and had to be hospitalized   Prior to Admission medications   Medication Sig Start Date End Date Taking? Authorizing Provider  acetaminophen (TYLENOL) 500 MG tablet Take 1,000 mg by mouth daily as needed for moderate pain or headache.   Yes Historical Provider, MD  albuterol (PROVENTIL HFA;VENTOLIN  HFA) 108 (90 Base) MCG/ACT inhaler Inhale 2 puffs into the lungs every 6 (six) hours as needed for wheezing or shortness of breath.   Yes Historical Provider, MD  atorvastatin (LIPITOR) 40 MG tablet Take 40 mg by mouth every evening.    Yes Historical Provider, MD  budesonide-formoterol (SYMBICORT) 160-4.5 MCG/ACT inhaler Inhale 2 puffs into the lungs 2 (two) times daily.   Yes Historical Provider, MD  Cholecalciferol 2000 units CAPS Take 2,000 Units by mouth every morning.    Yes Historical Provider, MD  clopidogrel (PLAVIX) 75 MG tablet Take 75 mg by mouth every morning.   Yes Historical Provider,  MD  diphenhydramine-acetaminophen (TYLENOL PM) 25-500 MG TABS tablet Take 2 tablets by mouth at bedtime as needed (sleep).   Yes Historical Provider, MD  gabapentin (NEURONTIN) 300 MG capsule Take 300 mg by mouth 2 (two) times daily.   Yes Historical Provider, MD  hydrALAZINE (APRESOLINE) 25 MG tablet Take 25 mg by mouth 2 (two) times daily.    Yes Historical Provider, MD  losartan (COZAAR) 50 MG tablet Take 50 mg by mouth daily.    Yes Historical Provider, MD  omeprazole (PRILOSEC) 20 MG capsule Take 20 mg by mouth 2 (two) times daily before a meal.   Yes Historical Provider, MD  vitamin B-12 (CYANOCOBALAMIN) 1000 MCG tablet Take 1,000 mcg by mouth daily.   Yes Historical Provider, MD  polyethylene glycol (MIRALAX / GLYCOLAX) packet Take 17 g by mouth daily as needed for mild constipation. Patient not taking: Reported on 10/19/2016 08/08/16   Ramonita Lab, MD   Dg Chest 1 View  Result Date: 10/19/2016 CLINICAL DATA:  81 year old female status post fall today at 1700 hours, found down at 1830 hours. Right hip fracture. On Plavix. Initial encounter. EXAM: CHEST 1 VIEW COMPARISON:  Chest CTA 08/06/2016 and earlier. FINDINGS: Supine AP view of the chest at 1927 hours. Stable lung volumes. The lungs are clear. No pneumothorax or pleural effusion identified. Eventration of the diaphragm. Stable cardiac size and mediastinal contours. Small to moderate gastric hiatal hernia. Calcified aortic atherosclerosis. Visualized tracheal air column is within normal limits. IMPRESSION: No acute cardiopulmonary abnormality. Moderate gastric hiatal hernia.  Calcified aortic atherosclerosis. Electronically Signed   By: Odessa Fleming M.D.   On: 10/19/2016 19:56   Ct Head Wo Contrast  Result Date: 10/19/2016 CLINICAL DATA:  81 year old female status post fall today at 1700 hours, found down at 1830 hours. Right hip fracture. On Plavix. Initial encounter. EXAM: CT HEAD WITHOUT CONTRAST TECHNIQUE: Contiguous axial images were  obtained from the base of the skull through the vertex without intravenous contrast. COMPARISON:  Head CT without contrast 10/21/2014 FINDINGS: Brain: Left ICA terminus chronic embolization coil pack with streak artifact. 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 Patchy and confluent bilateral cerebral white matter hypodensity. No midline shift, mass effect, or evidence of intracranial mass lesion. No acute intracranial hemorrhage identified. No ventriculomegaly. No cortically based acute infarct identified. Vascular: Calcified atherosclerosis at the skull base. Skull: Intact.  No acute osseous abnormality identified. Sinuses/Orbits: Visualized paranasal sinuses and mastoids are stable and well pneumatized. Other: Right forehead scalp hematoma measures up to 10 mm in thickness. Underlying right frontal bone appears intact. Interval postoperative changes to both globes. Otherwise negative visible orbits soft tissues. No other scalp soft tissue injury identified. IMPRESSION: 1. Right forehead scalp soft tissue injury without underlying fracture. 2. No acute traumatic injury to the brain. 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 Electronically Signed   By: Odessa FlemingH  Hall M.D.   On: 10/19/2016 19:59   Dg Hip Unilat W Or Wo Pelvis 2-3 Views Right  Result Date: 10/19/2016 CLINICAL DATA:  81 year old female status post fall today at 1700 hours, found down at 1830 hours. On Plavix. Initial encounter. EXAM: DG HIP (WITH OR WITHOUT PELVIS) 2-3V RIGHT COMPARISON:  None. FINDINGS: Acute fracture of the right femoral neck appears to be mildly comminuted and there is varus impaction. The right femoral head remains normally located. The intertrochanteric segment and proximal shaft of the right femur appears to remain intact. Left femoral head normally located. Grossly intact proximal left femur. No superimposed  pelvis fracture identified. Aortoiliac calcified atherosclerosis noted. Common iliac artery vascular stents. Pelvic phleboliths. Negative visible bowel gas pattern. IMPRESSION: 1. Right femoral neck fracture appears to be mildly comminuted with varus impaction. 2. No other acute fracture about the right hip or pelvis. Right femur intertrochanteric segment appears intact. 3.  Aortoiliac calcified atherosclerosis with vascular stents. Electronically Signed   By: Odessa FlemingH  Hall M.D.   On: 10/19/2016 19:54    Positive ROS: All other systems have been reviewed and were otherwise negative with the exception of those mentioned in the HPI and as above.  Physical Exam: General: Alert, no acute distress  MUSCULOSKELETAL: Right lower extremity: Patient is still wearing her trousers. She has a shortened and externally rotated right lower extremity. She has palpable pedal pulses, intact sensation light touch and intact motor function. She can flex and extend her toes and dorsiflex and plantarflex her ankle.  Assessment: Right displaced femoral neck hip fracture  Plan: I explained to the patient and her family the nature of her injury. They understand she has a displaced femoral neck hip fracture. I have recommended a right hip hemiarthroplasty to treat her fracture. I discussed in detail the operation as well as the postoperative course. I discussed with them the risks and benefits of surgery. They understand the risks include infection, bleeding requiring blood transfusion, nerve or blood vessel injury, leg length discrepancy, change in lower extremity rotation, fracture, dislocation, persistent right hip pain, failure of the hardware and the need for further surgery including conversion to a total hip arthroplasty. They also understand the medical risks which include but are not limited to DVT and pulmonary embolism, myocardial infarction, stroke, pneumonia, respiratory failure and death.  She is being admitted to the  hospitalist service for preoperative clearance. I reviewed the patient's radiographs in preparation for this case. She will be nothing by mouth after midnight. Patient should stop her Plavix in preparation for surgery. She should not receive anticoagulation tonight in preparation for surgery. He scheduled tomorrow if she is medically cleared.    Juanell FairlyKRASINSKI, Nathaniel Wakeley, MD    10/19/2016 8:38 PM

## 2016-10-19 NOTE — ED Notes (Signed)
Patient transported to X-ray 

## 2016-10-19 NOTE — ED Notes (Signed)
Family at bedside. 

## 2016-10-19 NOTE — ED Notes (Signed)
ED Provider at bedside. 

## 2016-10-19 NOTE — H&P (Signed)
[facility] - [place] at [facility]   PATIENT NAME: Donna Quinn    MR#:  [id number]  DATE OF BIRTH:  [birth year]  DATE OF ADMISSION:  10/19/2016  PRIMARY CARE PHYSICIAN: Marguarite Arbour, MD   REQUESTING/REFERRING PHYSICIAN: Pershing Proud, MD  CHIEF COMPLAINT:   Chief Complaint  Patient presents with  . Fall    HISTORY OF PRESENT ILLNESS:  Donna Quinn  is a 81 y.o. female who presents with Mechanical fall and subsequent right femoral neck fracture. Labs are largely within normal limits. Patient states she was at her home today and reached over to get her pocketbook and lost her balance and fell. She fell onto a hard floor. Hospitalists were called for admission with orthopedic surgery following along for surgical repair.  PAST MEDICAL HISTORY:   Past Medical History:  Diagnosis Date  . AAA (abdominal aortic aneurysm) (HCC)   . AAA (abdominal aortic aneurysm) (HCC)   . Abdominal aortic aneurysm (AAA) (HCC)   . Brain aneurysm   . Collagen vascular disease (HCC)   . COPD (chronic obstructive pulmonary disease) (HCC)   . Dyspnea   . GERD (gastroesophageal reflux disease)   . H/O wheezing   . Hypercholesterolemia   . Hypertension   . Neuropathy (HCC)   . Osteoporosis   . Stroke [facility]) 2016   twice    PAST SURGICAL HISTORY:   Past Surgical History:  Procedure Laterality Date  . abdominal Bilateral    stents  . BACK SURGERY     cyst removed from back  . BRAIN SURGERY     Brain aneurysm repair, coiling  . CAROTID ENDARTERECTOMY    . CATARACT EXTRACTION W/PHACO Left 07/08/2016   Procedure: CATARACT EXTRACTION PHACO AND INTRAOCULAR LENS PLACEMENT (IOC);  Surgeon: Nevada Crane, MD;  Location: [facility] ORS;  Service: Ophthalmology;  Laterality: Left;  [place]  01:30 AP% 39.1 CDE 20.3 Fluid pack lot # [id number]  . CATARACT EXTRACTION W/PHACO Right 09/16/2016   Procedure: CATARACT EXTRACTION PHACO AND INTRAOCULAR LENS PLACEMENT (IOC);  Surgeon: Nevada Crane, MD;  Location: [facility] ORS;  Service: Ophthalmology;  Laterality: Right;  Lot # [id number] H [place]: 01"21.7 AP% 16.8: CDE: 13.90    SOCIAL HISTORY:   Social History  Substance Use Topics  . Smoking status: Former Games developer  . Smokeless tobacco: Never Used  . Alcohol use No    FAMILY HISTORY:   Family History  Problem Relation Age of Onset  . Breast cancer Sister 73    DRUG ALLERGIES:   Allergies  Allergen Reactions  . Aggrenox [Aspirin-Dipyridamole Er]     A really bad headache  . Augmentin [Amoxicillin-Pot Clavulanate]     unknown  . Penicillins Other (See Comments)    Has patient had a PCN reaction causing immediate rash, facial/tongue/throat swelling, SOB or lightheadedness with hypotension: Yes Has patient had a PCN reaction causing severe rash involving mucus membranes or skin necrosis: Yes Has patient had a PCN reaction that required hospitalization No Has patient had a PCN reaction occurring within the last 10 years: No If all of the above answers are "NO", then may proceed with Cephalosporin use.  . Remeron [Mirtazapine]     Broke out inside the mouth  . Sulfa Antibiotics     Broke out in welps and had to be hospitalized    MEDICATIONS AT HOME:   Prior to Admission medications   Medication Sig Start Date End Date Taking? Authorizing Provider  acetaminophen (TYLENOL) 500 MG tablet Take  1,000 mg by mouth daily as needed for moderate pain or headache.   Yes Historical Provider, MD  albuterol (PROVENTIL HFA;VENTOLIN HFA) 108 (90 Base) MCG/ACT inhaler Inhale 2 puffs into the lungs every 6 (six) hours as needed for wheezing or shortness of breath.   Yes Historical Provider, MD  atorvastatin (LIPITOR) 40 MG tablet Take 40 mg by mouth every evening.    Yes Historical Provider, MD  budesonide-formoterol (SYMBICORT) 160-4.5 MCG/ACT inhaler Inhale 2 puffs into the lungs 2 (two) times daily.   Yes Historical Provider, MD  Cholecalciferol 2000 units CAPS Take 2,000 Units by  mouth every morning.    Yes Historical Provider, MD  clopidogrel (PLAVIX) 75 MG tablet Take 75 mg by mouth every morning.   Yes Historical Provider, MD  diphenhydramine-acetaminophen (TYLENOL PM) 25-500 MG TABS tablet Take 2 tablets by mouth at bedtime as needed (sleep).   Yes Historical Provider, MD  gabapentin (NEURONTIN) 300 MG capsule Take 300 mg by mouth 2 (two) times daily.   Yes Historical Provider, MD  hydrALAZINE (APRESOLINE) 25 MG tablet Take 25 mg by mouth 2 (two) times daily.    Yes Historical Provider, MD  losartan (COZAAR) 50 MG tablet Take 50 mg by mouth daily.    Yes Historical Provider, MD  omeprazole (PRILOSEC) 20 MG capsule Take 20 mg by mouth 2 (two) times daily before a meal.   Yes Historical Provider, MD  vitamin B-12 (CYANOCOBALAMIN) 1000 MCG tablet Take 1,000 mcg by mouth daily.   Yes Historical Provider, MD  polyethylene glycol (MIRALAX / GLYCOLAX) packet Take 17 g by mouth daily as needed for mild constipation. Patient not taking: Reported on 10/19/2016 08/08/16   Ramonita Lab, MD    REVIEW OF SYSTEMS:  Review of Systems  Constitutional: Negative for chills, fever, malaise/fatigue and weight loss.  HENT: Negative for ear pain, hearing loss and tinnitus.   Eyes: Negative for blurred vision, double vision, pain and redness.  Respiratory: Negative for cough, hemoptysis and shortness of breath.   Cardiovascular: Negative for chest pain, palpitations, orthopnea and leg swelling.  Gastrointestinal: Negative for abdominal pain, constipation, diarrhea, nausea and vomiting.  Genitourinary: Negative for dysuria, frequency and hematuria.  Musculoskeletal: Positive for joint pain (Right hip). Negative for back pain and neck pain.  Skin:       No acne, rash, or lesions  Neurological: Negative for dizziness, tremors, focal weakness and weakness.  Endo/Heme/Allergies: Negative for polydipsia. Does not bruise/bleed easily.  Psychiatric/Behavioral: Negative for depression. The  patient is not nervous/anxious and does not have insomnia.      VITAL SIGNS:   Vitals:   10/19/16 1909 10/19/16 1912  BP: (!) 184/103   Pulse: 97   Resp: 15   Temp: 98.1 F (36.7 C)   TempSrc: Oral   SpO2: 98%   Weight:  63.5 kg (140 lb)  Height:  5\' 6"  (1.676 m)   Wt Readings from Last 3 Encounters:  10/19/16 63.5 kg (140 lb)  09/16/16 63.5 kg (140 lb)  08/08/16 61.4 kg (135 lb 6.4 oz)    PHYSICAL EXAMINATION:  Physical Exam  Vitals reviewed. Constitutional: She is oriented to person, place, and time. She appears well-developed and well-nourished. No distress.  HENT:  Head: Normocephalic and atraumatic.  Mouth/Throat: Oropharynx is clear and moist.  Eyes: Conjunctivae and EOM are normal. Pupils are equal, round, and reactive to light. No scleral icterus.  Neck: Normal range of motion. Neck supple. No JVD present. No thyromegaly present.  Cardiovascular: Normal  rate, regular rhythm and intact distal pulses.  Exam reveals no gallop and no friction rub.   No murmur heard. Respiratory: Effort normal and breath sounds normal. No respiratory distress. She has no wheezes. She has no rales.  GI: Soft. Bowel sounds are normal. She exhibits no distension. There is no tenderness.  Musculoskeletal: Normal range of motion. She exhibits tenderness (Right hip) and deformity (Right leg shortened). She exhibits no edema.  No arthritis, no gout  Lymphadenopathy:    She has no cervical adenopathy.  Neurological: She is alert and oriented to person, place, and time. No cranial nerve deficit.  No dysarthria, no aphasia  Skin: Skin is warm and dry. No rash noted. No erythema.  Psychiatric: She has a normal mood and affect. Her behavior is normal. Judgment and thought content normal.    LABORATORY PANEL:   CBC  Recent Labs Lab 10/19/16 1913  WBC 9.2  HGB 12.5  HCT 37.4  PLT 201    ------------------------------------------------------------------------------------------------------------------  Chemistries   Recent Labs Lab 10/19/16 1913  NA 138  K 4.4  CL 104  CO2 26  GLUCOSE 131*  BUN 22*  CREATININE 1.11*  CALCIUM 9.2   ------------------------------------------------------------------------------------------------------------------  Cardiac Enzymes No results for input(s): TROPONINI in the last 168 hours. ------------------------------------------------------------------------------------------------------------------  RADIOLOGY:  Dg Chest 1 View  Result Date: 10/19/2016 CLINICAL DATA:  81 year old female status post fall today at 1700 hours, found down at 1830 hours. Right hip fracture. On Plavix. Initial encounter. EXAM: CHEST 1 VIEW COMPARISON:  Chest CTA 08/06/2016 and earlier. FINDINGS: Supine AP view of the chest at 1927 hours. Stable lung volumes. The lungs are clear. No pneumothorax or pleural effusion identified. Eventration of the diaphragm. Stable cardiac size and mediastinal contours. Small to moderate gastric hiatal hernia. Calcified aortic atherosclerosis. Visualized tracheal air column is within normal limits. IMPRESSION: No acute cardiopulmonary abnormality. Moderate gastric hiatal hernia.  Calcified aortic atherosclerosis. Electronically Signed   By: Odessa FlemingH  Hall M.D.   On: 10/19/2016 19:56   Ct Head Wo Contrast  Result Date: 10/19/2016 CLINICAL DATA:  81 year old female status post fall today at 1700 hours, found down at 1830 hours. Right hip fracture. On Plavix. Initial encounter. EXAM: CT HEAD WITHOUT CONTRAST TECHNIQUE: Contiguous axial images were obtained from the base of the skull through the vertex without intravenous contrast. COMPARISON:  Head CT without contrast 10/21/2014 FINDINGS: Brain: Left ICA terminus chronic embolization coil pack with streak artifact. Small chronic infarcts in the left lateral cerebellum and probably also at  the ventral left thalamus. Patchy and confluent bilateral cerebral white matter hypodensity. No midline shift, mass effect, or evidence of intracranial mass lesion. No acute intracranial hemorrhage identified. No ventriculomegaly. No cortically based acute infarct identified. Vascular: Calcified atherosclerosis at the skull base. Skull: Intact.  No acute osseous abnormality identified. Sinuses/Orbits: Visualized paranasal sinuses and mastoids are stable and well pneumatized. Other: Right forehead scalp hematoma measures up to 10 mm in thickness. Underlying right frontal bone appears intact. Interval postoperative changes to both globes. Otherwise negative visible orbits soft tissues. No other scalp soft tissue injury identified. IMPRESSION: 1. Right forehead scalp soft tissue injury without underlying fracture. 2. No acute traumatic injury to the brain. 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 Electronically Signed   By: Odessa FlemingH  Hall M.D.   On: 10/19/2016 19:59   Dg Hip Unilat W Or Wo Pelvis 2-3 Views Right  Result Date: 10/19/2016 CLINICAL DATA:  81 year old female  status post fall today at 1700 hours, found down at 1830 hours. On Plavix. Initial encounter. EXAM: DG HIP (WITH OR WITHOUT PELVIS) 2-3V RIGHT COMPARISON:  None. FINDINGS: Acute fracture of the right femoral neck appears to be mildly comminuted and there is varus impaction. The right femoral head remains normally located. The intertrochanteric segment and proximal shaft of the right femur appears to remain intact. Left femoral head normally located. Grossly intact proximal left femur. No superimposed pelvis fracture identified. Aortoiliac calcified atherosclerosis noted. Common iliac artery vascular stents. Pelvic phleboliths. Negative visible bowel gas pattern. IMPRESSION: 1. Right femoral neck fracture appears to be mildly comminuted with varus impaction. 2. No other acute fracture about the  right hip or pelvis. Right femur intertrochanteric segment appears intact. 3.  Aortoiliac calcified atherosclerosis with vascular stents. Electronically Signed   By: Odessa Fleming M.D.   On: 10/19/2016 19:54    EKG:   Orders placed or performed during the hospital encounter of 10/19/16  . EKG 12-Lead  . EKG 12-Lead    IMPRESSION AND PLAN:  Principal Problem:   Fracture of femoral neck, right (HCC) - orthopedic surgery has been consulted and plans an operative repair tomorrow morning. Cardiac risk stratification as below: Patient has 1 risk factor as she has had a prior stroke.   Active Problems:   HTN (hypertension) - continue home meds, blood pressure is currently stable   COPD (HCC) - not in exacerbation, continue home dose inhalers   HLD (hyperlipidemia) - continue home dose statin   GERD (gastroesophageal reflux disease) - continue home dose PPI  All the records are reviewed and case discussed with ED provider. Management plans discussed with the patient and/or family.  DVT PROPHYLAXIS: Mechanical only  GI PROPHYLAXIS: PPI  ADMISSION STATUS: Inpatient  CODE STATUS: Full Code Status History    Date Active Date Inactive Code Status Order ID Comments User Context   08/06/2016  5:44 PM 08/08/2016  4:37 PM Full Code [id number]  Milagros Loll, MD ED      TOTAL TIME TAKING CARE OF THIS PATIENT: 45 minutes.    Minnie Legros FIELDING 10/19/2016, 8:39 PM  [facility]  Office  [phone]  CC: Primary care physician; Marguarite Arbour, MD

## 2016-10-20 ENCOUNTER — Encounter
Admission: EM | Disposition: A | Discharge status: Home / Self Care | Payer: Self-pay | Source: Home / Self Care | Attending: Internal Medicine

## 2016-10-20 ENCOUNTER — Inpatient Hospital Stay: Payer: Medicare HMO | Admitting: Anesthesiology

## 2016-10-20 ENCOUNTER — Encounter: Payer: Self-pay | Admitting: *Deleted

## 2016-10-20 ENCOUNTER — Inpatient Hospital Stay: Payer: Medicare HMO

## 2016-10-20 DIAGNOSIS — J209 Acute bronchitis, unspecified: Secondary | ICD-10-CM

## 2016-10-20 DIAGNOSIS — J440 Chronic obstructive pulmonary disease with acute lower respiratory infection: Secondary | ICD-10-CM

## 2016-10-20 HISTORY — PX: HIP ARTHROPLASTY: SHX981

## 2016-10-20 LAB — BLOOD GAS, ARTERIAL
ACID-BASE DEFICIT: 3.3 mmol/L — AB (ref 0.0–2.0)
BICARBONATE: 24.2 mmol/L (ref 20.0–28.0)
FIO2: 0.35
O2 SAT: 97.9 %
PEEP: 5 cmH2O
PH ART: 7.26 — AB (ref 7.350–7.450)
PO2 ART: 117 mmHg — AB (ref 83.0–108.0)
PRESSURE SUPPORT: 10 cmH2O
Patient temperature: 37
pCO2 arterial: 54 mmHg — ABNORMAL HIGH (ref 32.0–48.0)

## 2016-10-20 LAB — BASIC METABOLIC PANEL
ANION GAP: 6 (ref 5–15)
BUN: 21 mg/dL — ABNORMAL HIGH (ref 6–20)
CO2: 28 mmol/L (ref 22–32)
Calcium: 8.6 mg/dL — ABNORMAL LOW (ref 8.9–10.3)
Chloride: 107 mmol/L (ref 101–111)
Creatinine, Ser: 1.21 mg/dL — ABNORMAL HIGH (ref 0.44–1.00)
GFR, EST AFRICAN AMERICAN: 48 mL/min — AB (ref >60)
GFR, EST NON AFRICAN AMERICAN: 42 mL/min — AB (ref >60)
Glucose, Bld: 110 mg/dL — ABNORMAL HIGH (ref 65–99)
POTASSIUM: 4.6 mmol/L (ref 3.5–5.1)
Sodium: 141 mmol/L (ref 135–145)

## 2016-10-20 LAB — SURGICAL PCR SCREEN
MRSA, PCR: NEGATIVE
STAPHYLOCOCCUS AUREUS: NEGATIVE

## 2016-10-20 LAB — CBC
HEMATOCRIT: 30.5 % — AB (ref 35.0–47.0)
Hemoglobin: 10.5 g/dL — ABNORMAL LOW (ref 12.0–16.0)
MCH: 30.1 pg (ref 26.0–34.0)
MCHC: 34.3 g/dL (ref 32.0–36.0)
MCV: 87.8 fL (ref 80.0–100.0)
Platelets: 169 10*3/uL (ref 150–440)
RBC: 3.48 MIL/uL — ABNORMAL LOW (ref 3.80–5.20)
RDW: 14.7 % — ABNORMAL HIGH (ref 11.5–14.5)
WBC: 6.2 10*3/uL (ref 3.6–11.0)

## 2016-10-20 LAB — TRIGLYCERIDES: Triglycerides: 82 mg/dL (ref <150)

## 2016-10-20 SURGERY — HEMIARTHROPLASTY, HIP, DIRECT ANTERIOR APPROACH, FOR FRACTURE
Anesthesia: General | Site: Hip | Laterality: Right | Wound class: Clean

## 2016-10-20 MED ORDER — DIPHENHYDRAMINE HCL 12.5 MG/5ML PO ELIX
12.5000 mg | ORAL_SOLUTION | ORAL | Status: DC | PRN
  Filled 2016-10-20: qty 10

## 2016-10-20 MED ORDER — EPHEDRINE SULFATE 50 MG/ML IJ SOLN
INTRAMUSCULAR | Status: DC | PRN
  Administered 2016-10-20 (×2): 5 mg via INTRAVENOUS
  Administered 2016-10-20: 10 mg via INTRAVENOUS
  Administered 2016-10-20: 5 mg via INTRAVENOUS

## 2016-10-20 MED ORDER — ONDANSETRON HCL 4 MG/2ML IJ SOLN: 4.0000 mg | Freq: Four times a day (QID) | INTRAMUSCULAR | Status: DC | PRN

## 2016-10-20 MED ORDER — FLEET ENEMA 7-19 GM/118ML RE ENEM: 1.0000 | ENEMA | Freq: Once | RECTAL | Status: DC | PRN

## 2016-10-20 MED ORDER — MEPERIDINE HCL 25 MG/ML IJ SOLN: 6.2500 mg | INTRAMUSCULAR | Status: DC | PRN

## 2016-10-20 MED ORDER — METOCLOPRAMIDE HCL 5 MG/ML IJ SOLN: 5.0000 mg | Freq: Three times a day (TID) | INTRAMUSCULAR | Status: DC | PRN

## 2016-10-20 MED ORDER — ONDANSETRON HCL 4 MG/2ML IJ SOLN
INTRAMUSCULAR | Status: AC
  Filled 2016-10-20: qty 2

## 2016-10-20 MED ORDER — ENOXAPARIN SODIUM 40 MG/0.4ML ~~LOC~~ SOLN
40.0000 mg | SUBCUTANEOUS | Status: DC
  Administered 2016-10-21: 40 mg via SUBCUTANEOUS
  Filled 2016-10-20: qty 0.4

## 2016-10-20 MED ORDER — ACETAMINOPHEN 10 MG/ML IV SOLN
INTRAVENOUS | Status: DC | PRN
  Administered 2016-10-20: 1000 mg via INTRAVENOUS

## 2016-10-20 MED ORDER — ACETAMINOPHEN 10 MG/ML IV SOLN
INTRAVENOUS | Status: AC
  Filled 2016-10-20: qty 100

## 2016-10-20 MED ORDER — ACETAMINOPHEN 650 MG RE SUPP: 650.0000 mg | Freq: Four times a day (QID) | RECTAL | Status: DC | PRN

## 2016-10-20 MED ORDER — NEOMYCIN-POLYMYXIN B GU 40-200000 IR SOLN
Status: DC | PRN
  Administered 2016-10-20: 4 mL

## 2016-10-20 MED ORDER — DEXAMETHASONE SODIUM PHOSPHATE 10 MG/ML IJ SOLN
INTRAMUSCULAR | Status: AC
  Filled 2016-10-20: qty 1

## 2016-10-20 MED ORDER — ONDANSETRON HCL 4 MG PO TABS: 4.0000 mg | ORAL_TABLET | Freq: Four times a day (QID) | ORAL | Status: DC | PRN

## 2016-10-20 MED ORDER — ACETAMINOPHEN 500 MG PO TABS
1000.0000 mg | ORAL_TABLET | Freq: Four times a day (QID) | ORAL | Status: AC
  Administered 2016-10-21 (×3): 1000 mg via ORAL
  Filled 2016-10-20 (×3): qty 2

## 2016-10-20 MED ORDER — PROPOFOL 1000 MG/100ML IV EMUL: 5.0000 ug/kg/min | INTRAVENOUS | Status: DC

## 2016-10-20 MED ORDER — OXYCODONE HCL 5 MG PO TABS: 5.0000 mg | ORAL_TABLET | Freq: Once | ORAL | Status: DC | PRN

## 2016-10-20 MED ORDER — SODIUM CHLORIDE 0.9 % IV SOLN
INTRAVENOUS | Status: DC
  Administered 2016-10-20 (×2): via INTRAVENOUS

## 2016-10-20 MED ORDER — MIDAZOLAM HCL 2 MG/2ML IJ SOLN
INTRAMUSCULAR | Status: AC
  Filled 2016-10-20: qty 2

## 2016-10-20 MED ORDER — PHENYLEPHRINE HCL 10 MG/ML IJ SOLN
INTRAMUSCULAR | Status: DC | PRN
  Administered 2016-10-20: 100 ug via INTRAVENOUS
  Administered 2016-10-20 (×2): 50 ug via INTRAVENOUS
  Administered 2016-10-20: 100 ug via INTRAVENOUS

## 2016-10-20 MED ORDER — ROCURONIUM BROMIDE 100 MG/10ML IV SOLN
INTRAVENOUS | Status: DC | PRN
  Administered 2016-10-20: 10 mg via INTRAVENOUS
  Administered 2016-10-20: 40 mg via INTRAVENOUS

## 2016-10-20 MED ORDER — HYDROMORPHONE HCL 1 MG/ML IJ SOLN
INTRAMUSCULAR | Status: DC | PRN
  Administered 2016-10-20 (×2): 0.5 mg via INTRAVENOUS

## 2016-10-20 MED ORDER — ORAL CARE MOUTH RINSE: 15.0000 mL | OROMUCOSAL | Status: DC

## 2016-10-20 MED ORDER — CHLORHEXIDINE GLUCONATE 0.12% ORAL RINSE (MEDLINE KIT): 15.0000 mL | Freq: Two times a day (BID) | OROMUCOSAL | Status: DC

## 2016-10-20 MED ORDER — FENTANYL CITRATE (PF) 100 MCG/2ML IJ SOLN
INTRAMUSCULAR | Status: DC | PRN
  Administered 2016-10-20 (×4): 50 ug via INTRAVENOUS

## 2016-10-20 MED ORDER — ACETAMINOPHEN 325 MG PO TABS: 650.0000 mg | ORAL_TABLET | Freq: Four times a day (QID) | ORAL | Status: DC | PRN

## 2016-10-20 MED ORDER — FENTANYL CITRATE (PF) 100 MCG/2ML IJ SOLN
INTRAMUSCULAR | Status: AC
  Filled 2016-10-20: qty 2

## 2016-10-20 MED ORDER — LIDOCAINE HCL (CARDIAC) 20 MG/ML IV SOLN
INTRAVENOUS | Status: DC | PRN
  Administered 2016-10-20: 80 mg via INTRAVENOUS

## 2016-10-20 MED ORDER — HYDROMORPHONE HCL 1 MG/ML IJ SOLN
INTRAMUSCULAR | Status: AC
  Filled 2016-10-20: qty 1

## 2016-10-20 MED ORDER — HYDROMORPHONE HCL 1 MG/ML IJ SOLN: 0.5000 mg | INTRAMUSCULAR | Status: DC | PRN

## 2016-10-20 MED ORDER — CLINDAMYCIN PHOSPHATE 600 MG/50ML IV SOLN
600.0000 mg | Freq: Four times a day (QID) | INTRAVENOUS | Status: AC
  Administered 2016-10-20 – 2016-10-21 (×3): 600 mg via INTRAVENOUS
  Filled 2016-10-20 (×3): qty 50

## 2016-10-20 MED ORDER — KCL IN DEXTROSE-NACL 20-5-0.9 MEQ/L-%-% IV SOLN
INTRAVENOUS | Status: DC
  Administered 2016-10-20: 75 mL/h via INTRAVENOUS
  Administered 2016-10-21: 08:00:00 via INTRAVENOUS
  Filled 2016-10-20 (×5): qty 1000

## 2016-10-20 MED ORDER — MIDAZOLAM HCL 2 MG/2ML IJ SOLN
INTRAMUSCULAR | Status: DC | PRN
  Administered 2016-10-20 (×2): 1 mg via INTRAVENOUS

## 2016-10-20 MED ORDER — DOCUSATE SODIUM 100 MG PO CAPS
100.0000 mg | ORAL_CAPSULE | Freq: Two times a day (BID) | ORAL | Status: DC
  Administered 2016-10-21 – 2016-10-22 (×3): 100 mg via ORAL
  Filled 2016-10-20 (×3): qty 1

## 2016-10-20 MED ORDER — BISACODYL 10 MG RE SUPP: 10.0000 mg | Freq: Every day | RECTAL | Status: DC | PRN

## 2016-10-20 MED ORDER — FAMOTIDINE IN NACL 20-0.9 MG/50ML-% IV SOLN
20.0000 mg | Freq: Every day | INTRAVENOUS | Status: DC
  Administered 2016-10-20: 20 mg via INTRAVENOUS
  Filled 2016-10-20: qty 50

## 2016-10-20 MED ORDER — FENTANYL CITRATE (PF) 100 MCG/2ML IJ SOLN: 25.0000 ug | INTRAMUSCULAR | Status: DC | PRN

## 2016-10-20 MED ORDER — METOCLOPRAMIDE HCL 10 MG PO TABS: 5.0000 mg | ORAL_TABLET | Freq: Three times a day (TID) | ORAL | Status: DC | PRN

## 2016-10-20 MED ORDER — DEXAMETHASONE SODIUM PHOSPHATE 10 MG/ML IJ SOLN
INTRAMUSCULAR | Status: DC | PRN
  Administered 2016-10-20: 10 mg via INTRAVENOUS

## 2016-10-20 MED ORDER — ONDANSETRON HCL 4 MG/2ML IJ SOLN
INTRAMUSCULAR | Status: DC | PRN
  Administered 2016-10-20: 4 mg via INTRAVENOUS

## 2016-10-20 MED ORDER — PROPOFOL 10 MG/ML IV BOLUS
INTRAVENOUS | Status: DC | PRN
  Administered 2016-10-20: 85 mg via INTRAVENOUS

## 2016-10-20 MED ORDER — SODIUM CHLORIDE 0.9 % IV SOLN
INTRAVENOUS | Status: DC | PRN
  Administered 2016-10-20: 70 mL

## 2016-10-20 MED ORDER — PROMETHAZINE HCL 25 MG/ML IJ SOLN: 6.2500 mg | INTRAMUSCULAR | Status: DC | PRN

## 2016-10-20 MED ORDER — LIDOCAINE HCL (PF) 2 % IJ SOLN
INTRAMUSCULAR | Status: AC
  Filled 2016-10-20: qty 2

## 2016-10-20 MED ORDER — OXYCODONE HCL 5 MG PO TABS
5.0000 mg | ORAL_TABLET | ORAL | Status: DC | PRN
  Administered 2016-10-21 – 2016-10-22 (×2): 5 mg via ORAL
  Filled 2016-10-20 (×2): qty 1

## 2016-10-20 MED ORDER — MAGNESIUM HYDROXIDE 400 MG/5ML PO SUSP
30.0000 mL | Freq: Every day | ORAL | Status: DC | PRN
  Administered 2016-10-21: 30 mL via ORAL
  Filled 2016-10-20: qty 30

## 2016-10-20 MED ORDER — PROPOFOL 500 MG/50ML IV EMUL
INTRAVENOUS | Status: AC
  Filled 2016-10-20: qty 50

## 2016-10-20 MED ORDER — CLINDAMYCIN PHOSPHATE 600 MG/50ML IV SOLN
INTRAVENOUS | Status: AC
  Filled 2016-10-20: qty 50

## 2016-10-20 MED ORDER — BUPIVACAINE-EPINEPHRINE (PF) 0.25% -1:200000 IJ SOLN
INTRAMUSCULAR | Status: DC | PRN
  Administered 2016-10-20: 30 mL via PERINEURAL

## 2016-10-20 MED ORDER — ROCURONIUM BROMIDE 50 MG/5ML IV SOLN
INTRAVENOUS | Status: AC
Start: 2016-10-20 — End: 2016-10-20
  Filled 2016-10-20: qty 1

## 2016-10-20 MED ORDER — OXYCODONE HCL 5 MG/5ML PO SOLN: 5.0000 mg | Freq: Once | ORAL | Status: DC | PRN

## 2016-10-20 MED ORDER — METHYLPREDNISOLONE SODIUM SUCC 125 MG IJ SOLR
INTRAMUSCULAR | Status: DC | PRN
  Administered 2016-10-20: 125 mg via INTRAVENOUS

## 2016-10-20 SURGICAL SUPPLY — 61 items
BAG DECANTER FOR FLEXI CONT (MISCELLANEOUS) IMPLANT
BLADE SAGITTAL WIDE XTHICK NO (BLADE) ×3 IMPLANT
BLADE SURG SZ20 CARB STEEL (BLADE) ×3 IMPLANT
BNDG COHESIVE 6X5 TAN STRL LF (GAUZE/BANDAGES/DRESSINGS) ×3 IMPLANT
BOWL CEMENT MIXING ADV NOZZLE (MISCELLANEOUS) IMPLANT
CANISTER SUCT 1200ML W/VALVE (MISCELLANEOUS) ×3 IMPLANT
CANISTER SUCT 3000ML (MISCELLANEOUS) ×6 IMPLANT
CAPT HIP HEMI 2 ×3 IMPLANT
CHLORAPREP W/TINT 26ML (MISCELLANEOUS) ×6 IMPLANT
CNTNR SPEC 2.5X3XGRAD LEK (MISCELLANEOUS) ×1
CONT SPEC 4OZ STER OR WHT (MISCELLANEOUS) ×2
CONTAINER SPEC 2.5X3XGRAD LEK (MISCELLANEOUS) ×1 IMPLANT
DECANTER SPIKE VIAL GLASS SM (MISCELLANEOUS) ×6 IMPLANT
DRAPE IMP U-DRAPE 54X76 (DRAPES) ×6 IMPLANT
DRAPE INCISE IOBAN 66X60 STRL (DRAPES) ×3 IMPLANT
DRAPE SHEET LG 3/4 BI-LAMINATE (DRAPES) ×3 IMPLANT
DRAPE SURG 17X11 SM STRL (DRAPES) ×3 IMPLANT
DRAPE SURG 17X23 STRL (DRAPES) ×3 IMPLANT
DRSG OPSITE POSTOP 4X12 (GAUZE/BANDAGES/DRESSINGS) ×3 IMPLANT
DRSG OPSITE POSTOP 4X14 (GAUZE/BANDAGES/DRESSINGS) ×3 IMPLANT
DRSG OPSITE POSTOP 4X8 (GAUZE/BANDAGES/DRESSINGS) ×3 IMPLANT
ELECT BLADE 6.5 EXT (BLADE) ×3 IMPLANT
ELECT CAUTERY BLADE 6.4 (BLADE) ×3 IMPLANT
ELECT REM PT RETURN 9FT ADLT (ELECTROSURGICAL) ×3
ELECTRODE REM PT RTRN 9FT ADLT (ELECTROSURGICAL) ×1 IMPLANT
GAUZE PACK 2X3YD (MISCELLANEOUS) IMPLANT
GLOVE BIO SURGEON STRL SZ8 (GLOVE) ×6 IMPLANT
GLOVE INDICATOR 8.0 STRL GRN (GLOVE) ×3 IMPLANT
GOWN STRL REUS W/ TWL LRG LVL3 (GOWN DISPOSABLE) ×1 IMPLANT
GOWN STRL REUS W/ TWL XL LVL3 (GOWN DISPOSABLE) ×1 IMPLANT
GOWN STRL REUS W/TWL LRG LVL3 (GOWN DISPOSABLE) ×2
GOWN STRL REUS W/TWL XL LVL3 (GOWN DISPOSABLE) ×2
HANDPIECE INTERPULSE COAX TIP (DISPOSABLE) ×2
HOOD PEEL AWAY FLYTE STAYCOOL (MISCELLANEOUS) ×6 IMPLANT
IV NS 100ML SINGLE PACK (IV SOLUTION) IMPLANT
LABEL OR SOLS (LABEL) ×3 IMPLANT
NDL SAFETY 18GX1.5 (NEEDLE) ×3 IMPLANT
NEEDLE FILTER BLUNT 18X 1/2SAF (NEEDLE) ×2
NEEDLE FILTER BLUNT 18X1 1/2 (NEEDLE) ×1 IMPLANT
NEEDLE SPNL 20GX3.5 QUINCKE YW (NEEDLE) ×3 IMPLANT
NS IRRIG 1000ML POUR BTL (IV SOLUTION) ×3 IMPLANT
PACK HIP PROSTHESIS (MISCELLANEOUS) ×3 IMPLANT
PILLOW ABDUC SM (MISCELLANEOUS) ×3 IMPLANT
SET HNDPC FAN SPRY TIP SCT (DISPOSABLE) ×1 IMPLANT
SOL .9 NS 3000ML IRR  AL (IV SOLUTION) ×4
SOL .9 NS 3000ML IRR UROMATIC (IV SOLUTION) ×2 IMPLANT
STAPLER SKIN PROX 35W (STAPLE) ×3 IMPLANT
STRAP SAFETY BODY (MISCELLANEOUS) ×3 IMPLANT
SUT ETHIBOND 2 V 37 (SUTURE) ×9 IMPLANT
SUT TICRON 2-0 30IN 311381 (SUTURE) ×9 IMPLANT
SUT VIC AB 1 CT1 36 (SUTURE) ×6 IMPLANT
SUT VIC AB 2-0 CT1 (SUTURE) ×9 IMPLANT
SUT VIC AB 2-0 CT1 27 (SUTURE) ×6
SUT VIC AB 2-0 CT1 TAPERPNT 27 (SUTURE) ×3 IMPLANT
SUT VICRYL 1-0 27IN ABS (SUTURE) ×6
SUTURE VICRYL 1-0 27IN ABS (SUTURE) ×2 IMPLANT
SYR 30ML LL (SYRINGE) ×9 IMPLANT
SYR TB 1ML 27GX1/2 LL (SYRINGE) IMPLANT
SYRINGE 10CC LL (SYRINGE) ×3 IMPLANT
TAPE TRANSPORE STRL 2 31045 (GAUZE/BANDAGES/DRESSINGS) ×3 IMPLANT
WATER STERILE IRR 1000ML POUR (IV SOLUTION) ×3 IMPLANT

## 2016-10-20 NOTE — Progress Notes (Signed)
Pt placed in 10/5 35% SBT for . ABG drawn. Order given to extubate. Pt follows commands of lifting head, sticking out her tongue and squeezing both hands of RT. Pt suctioned via ETT. Suctioned pt's mouth and raised head of bed to 45degree angle. ETT cuff deflated and ETT pulled as pt coughed. Suctioned pt's mouth again and placed on 3L [place]. Pt able to speak with a soft voice. No complaints. Family at bedside following extubation. Pt tolerated well. Vital signs are stable.

## 2016-10-20 NOTE — Consult Note (Signed)
Name: ELIZET KAPLAN MRN: [id number] DOB: 1935/11/11    ADMISSION DATE:  10/19/2016 CONSULTATION DATE:  10/20/16  REFERRING MD :  Dr. Leron Croak  CHIEF COMPLAINT:  Fall  BRIEF PATIENT DESCRIPTION:  Ms. Kocher is a 81 y.o. Female who presented to [facility] ED on 3/13 status post fall at home.  X-rays were concerning for right femoral neck fracture, subsequently she was admitted to [facility] Med/surg unit on 3/13 for further management of hip fracture and consultation with Orthopedic surgery.  On 3/14 she had right hip unipolar hemiarthroplasty performed by Dr. Joice Lofts, and post-op she remains on ventilator and is transferred to ICU .  PCCM is consulted for further vent management.   SIGNIFICANT EVENTS  3/13>> Admission to [facility] Med/surg unit s/p fall  3/14>> Right hip unipolar Hemiarthroplasty performed by Dr. Joice Lofts 3/14>> Transfer to ICU post-op and PCCM consulted  STUDIES:  3/13 CXR>> Supine AP view of the chest at 1927 hours. Stable lung volumes. The lungs are clear. No pneumothorax or pleural effusion identified. Eventration of the diaphragm. Stable cardiac size and mediastinal contours. Small to moderate gastric hiatal hernia. Calcified aortic atherosclerosis. Visualized tracheal air column is within normal limits. IMPRESSION: No acute cardiopulmonary abnormality. Moderate gastric hiatal hernia.  Calcified aortic atherosclerosis. 3/13 CT Head>> 1. Right forehead scalp soft tissue injury without underlying Fracture. 2. No acute traumatic injury to the brain. 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   HISTORY OF PRESENT ILLNESS:   Ms. Flamm is a 81 y.o. Female with a PMH of stroke, osteoporosis, neuropathy, HTN, Hypercholesterolemia, GERD, dyspnea, COPD, Collagen vascular disease, brain aneurysm, and AAA.  She presented to [facility] ED on 3/13 status post Fall at home,  where she lives independently.   She was bending over to pick up  her pocketbook when she lost her balance and fell, striking her head, and landing onto her right hip.  X-rays in the ED were concerning for a displaced right femoral neck fracture.  She was admitted to [facility] Med/surg unit on 3/13 for management of right hip fracture along with Orthopedic Surgery consultation.  On 3/14, Dr. Joice Lofts performed a right hip unipolar Hemiarthroplasty.  Post-op she is transferred to ICU on ventilator.  PCCM is consulted for further management of the ventilator.   PAST MEDICAL HISTORY :   has a past medical history of AAA (abdominal aortic aneurysm) (HCC); AAA (abdominal aortic aneurysm) (HCC); Abdominal aortic aneurysm (AAA) (HCC); Brain aneurysm; Collagen vascular disease (HCC); COPD (chronic obstructive pulmonary disease) (HCC); Dyspnea; GERD (gastroesophageal reflux disease); H/O wheezing; Hypercholesterolemia; Hypertension; Neuropathy (HCC); Osteoporosis; and Stroke (HCC) (2016).  has a past surgical history that includes Carotid endarterectomy; Back surgery; Brain surgery; abdominal (Bilateral); Cataract extraction w/PHACO (Left, 07/08/2016); and Cataract extraction w/PHACO (Right, 09/16/2016). Prior to Admission medications   Medication Sig Start Date End Date Taking? Authorizing Provider  acetaminophen (TYLENOL) 500 MG tablet Take 1,000 mg by mouth daily as needed for moderate pain or headache.   Yes Historical Provider, MD  albuterol (PROVENTIL HFA;VENTOLIN HFA) 108 (90 Base) MCG/ACT inhaler Inhale 2 puffs into the lungs every 6 (six) hours as needed for wheezing or shortness of breath.   Yes Historical Provider, MD  atorvastatin (LIPITOR) 40 MG tablet Take 40 mg by mouth every evening.    Yes Historical Provider, MD  budesonide-formoterol (SYMBICORT) 160-4.5 MCG/ACT inhaler Inhale 2 puffs into the lungs 2 (two) times daily.   Yes Historical Provider, MD  Cholecalciferol 2000 units CAPS Take 2,000 Units by mouth every morning.    Yes Historical Provider, MD  clopidogrel  (PLAVIX) 75 MG tablet Take 75 mg by mouth every morning.   Yes Historical Provider, MD  diphenhydramine-acetaminophen (TYLENOL PM) 25-500 MG TABS tablet Take 2 tablets by mouth at bedtime as needed (sleep).   Yes Historical Provider, MD  gabapentin (NEURONTIN) 300 MG capsule Take 300 mg by mouth 2 (two) times daily.   Yes Historical Provider, MD  hydrALAZINE (APRESOLINE) 25 MG tablet Take 25 mg by mouth 2 (two) times daily.    Yes Historical Provider, MD  losartan (COZAAR) 50 MG tablet Take 50 mg by mouth daily.    Yes Historical Provider, MD  omeprazole (PRILOSEC) 20 MG capsule Take 20 mg by mouth 2 (two) times daily before a meal.   Yes Historical Provider, MD  vitamin B-12 (CYANOCOBALAMIN) 1000 MCG tablet Take 1,000 mcg by mouth daily.   Yes Historical Provider, MD  polyethylene glycol (MIRALAX / GLYCOLAX) packet Take 17 g by mouth daily as needed for mild constipation. Patient not taking: Reported on 10/19/2016 08/08/16   Ramonita Lab, MD   Allergies  Allergen Reactions  . Aggrenox [Aspirin-Dipyridamole Er]     A really bad headache  . Augmentin [Amoxicillin-Pot Clavulanate]     unknown  . Penicillins Other (See Comments)    Has patient had a PCN reaction causing immediate rash, facial/tongue/throat swelling, SOB or lightheadedness with hypotension: Yes Has patient had a PCN reaction causing severe rash involving mucus membranes or skin necrosis: Yes Has patient had a PCN reaction that required hospitalization No Has patient had a PCN reaction occurring within the last 10 years: No If all of the above answers are "NO", then may proceed with Cephalosporin use.  . Remeron [Mirtazapine]     Broke out inside the mouth  . Sulfa Antibiotics     Broke out in welps and had to be hospitalized    FAMILY HISTORY:  family history includes Breast cancer (age of onset: 16) in her sister. SOCIAL HISTORY:  reports that she has quit smoking. She has never used smokeless tobacco. She reports that she  does not drink alcohol or use drugs.  REVIEW OF SYSTEMS:   Unable to perform as pt in on ventilator  SUBJECTIVE:  Unable to perform as pt is on ventilator  VITAL SIGNS: Temp:  [97.4 F (36.3 C)-99.7 F (37.6 C)] 97.4 F (36.3 C) (03/14 1737) Pulse Rate:  [73-97] 73 (03/14 1737) Resp:  [15-20] 17 (03/14 1737) BP: (125-184)/(45-103) 173/77 (03/14 1737) SpO2:  [89 %-100 %] 100 % (03/14 1737) FiO2 (%):  [40 %] 40 % (03/14 1740) Weight:  [140 lb (63.5 kg)] 140 lb (63.5 kg) (03/14 1350)  PHYSICAL EXAMINATION: General:  Intubated, drowsy, caucasian female, laying in bed, in no acute distress Neuro:  Drowsy, arouses to voice, follows simple commands, Pupils PERRL HEENT:  Normocephalic, Atraumatic, Neck supple, No JVD Cardiovascular:  RRR, s1s2, No M/R/G Lungs:  Ventilator assisted, Inspiratory wheezes to right side, Symmetrical expansion, No accessory muscle use Abdomen:  BS+ x4, Soft, Non-distended, Non-tender  Musculoskeletal:  Normal bulk and tone, 1+ generalized edema Skin:  Dry, Vertical surgical incision to right hip with honeycomb dressing   Recent Labs Lab 10/19/16 1913 10/20/16 0418  NA 138 141  K 4.4 4.6  CL 104 107  CO2 26 28  BUN 22* 21*  CREATININE 1.11* 1.21*  GLUCOSE 131* 110*    Recent Labs Lab 10/19/16  1913 10/20/16 0418  HGB 12.5 10.5*  HCT 37.4 30.5*  WBC 9.2 6.2  PLT 201 169   Dg Chest 1 View  Result Date: 10/19/2016 CLINICAL DATA:  81 year old female status post fall today at 1700 hours, found down at 1830 hours. Right hip fracture. On Plavix. Initial encounter. EXAM: CHEST 1 VIEW COMPARISON:  Chest CTA 08/06/2016 and earlier. FINDINGS: Supine AP view of the chest at 1927 hours. Stable lung volumes. The lungs are clear. No pneumothorax or pleural effusion identified. Eventration of the diaphragm. Stable cardiac size and mediastinal contours. Small to moderate gastric hiatal hernia. Calcified aortic atherosclerosis. Visualized tracheal air column is  within normal limits. IMPRESSION: No acute cardiopulmonary abnormality. Moderate gastric hiatal hernia.  Calcified aortic atherosclerosis. Electronically Signed   By: Odessa Fleming M.D.   On: 10/19/2016 19:56   Ct Head Wo Contrast  Result Date: 10/19/2016 CLINICAL DATA:  81 year old female status post fall today at 1700 hours, found down at 1830 hours. Right hip fracture. On Plavix. Initial encounter. EXAM: CT HEAD WITHOUT CONTRAST TECHNIQUE: Contiguous axial images were obtained from the base of the skull through the vertex without intravenous contrast. COMPARISON:  Head CT without contrast 10/21/2014 FINDINGS: Brain: Left ICA terminus chronic embolization coil pack with streak artifact. 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 Patchy and confluent bilateral cerebral white matter hypodensity. No midline shift, mass effect, or evidence of intracranial mass lesion. No acute intracranial hemorrhage identified. No ventriculomegaly. No cortically based acute infarct identified. Vascular: Calcified atherosclerosis at the skull base. Skull: Intact.  No acute osseous abnormality identified. Sinuses/Orbits: Visualized paranasal sinuses and mastoids are stable and well pneumatized. Other: Right forehead scalp hematoma measures up to 10 mm in thickness. Underlying right frontal bone appears intact. Interval postoperative changes to both globes. Otherwise negative visible orbits soft tissues. No other scalp soft tissue injury identified. IMPRESSION: 1. Right forehead scalp soft tissue injury without underlying fracture. 2. No acute traumatic injury to the brain. 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 Electronically Signed   By: Odessa Fleming M.D.   On: 10/19/2016 19:59   Dg Hip Unilat W Or Wo Pelvis 2-3 Views Right  Result Date: 10/19/2016 CLINICAL DATA:  82 year old female status post fall today at 1700  hours, found down at 1830 hours. On Plavix. Initial encounter. EXAM: DG HIP (WITH OR WITHOUT PELVIS) 2-3V RIGHT COMPARISON:  None. FINDINGS: Acute fracture of the right femoral neck appears to be mildly comminuted and there is varus impaction. The right femoral head remains normally located. The intertrochanteric segment and proximal shaft of the right femur appears to remain intact. Left femoral head normally located. Grossly intact proximal left femur. No superimposed pelvis fracture identified. Aortoiliac calcified atherosclerosis noted. Common iliac artery vascular stents. Pelvic phleboliths. Negative visible bowel gas pattern. IMPRESSION: 1. Right femoral neck fracture appears to be mildly comminuted with varus impaction. 2. No other acute fracture about the right hip or pelvis. Right femur intertrochanteric segment appears intact. 3.  Aortoiliac calcified atherosclerosis with vascular stents. Electronically Signed   By: Odessa Fleming M.D.   On: 10/19/2016 19:54    ASSESSMENT / PLAN:  PULMONARY A: Post-op Ventilator Hx: COPD  P: Maintain O2 sats >88% Will attempt SBT if pt more alert>> Attempted SBT 3/14 @ 1800, however she remains drowsy and pulling low lung volumes VAP Bundle Propofol for sedation RASS Goal -1 Obtain ABG  and CXR this evening 3/14 Continue prn Albuterol nebs  Lucie LeatherKurian David Dhaval Woo, M.D.  [facility]  Medical Director [facility] Medical Director [facility] Card[facility]-P[facility]     10/20/2016, 5:50 PM

## 2016-10-20 NOTE — NC FL2 (Signed)
[employer] MEDICAID FL2 LEVEL OF CARE SCREENING TOOL     IDENTIFICATION  Patient Name: Donna Quinn Birthdate: 12/20/1935 Sex: female Admission Date (Current Location): 10/19/2016  [place]   Facility and Address:  [facility], [address], [place], [place] [address]      Provider Number: [id number]  Attending Physician Name and Address:  Ramonita LabAruna Varnell Donate, MD  Relative Name and Phone Number:       Current Level of Care: Hospital Recommended Level of Care: Skilled Nursing Facility Prior Approval Number:    Date Approved/Denied:   PASRR Number:  [id number])  Discharge Plan: SNF    Current Diagnoses: Patient Active Problem List   Diagnosis Date Noted  . Fracture of femoral neck, right (HCC) 10/19/2016  . HTN (hypertension) 10/19/2016  . HLD (hyperlipidemia) 10/19/2016  . COPD with acute bronchitis (HCC) 10/19/2016  . GERD (gastroesophageal reflux disease) 10/19/2016  . COPD exacerbation (HCC) 08/06/2016    Orientation RESPIRATION BLADDER Height & Weight     Self, Time, Situation, Place  Normal Continent Weight: 140 lb (63.5 kg) Height:  5\' 6"  (167.6 cm)  BEHAVIORAL SYMPTOMS/MOOD NEUROLOGICAL BOWEL NUTRITION STATUS   (none)  (none) Continent Diet (NPO for surgery )  AMBULATORY STATUS COMMUNICATION OF NEEDS Skin   Extensive Assist Verbally Surgical wounds                       Personal Care Assistance Level of Assistance  Bathing, Feeding, Dressing Bathing Assistance: Limited assistance Feeding assistance: Independent Dressing Assistance: Limited assistance     Functional Limitations Info  Sight, Hearing, Speech Sight Info: Adequate Hearing Info: Adequate Speech Info: Adequate    SPECIAL CARE FACTORS FREQUENCY  PT (By licensed PT), OT (By licensed OT)     PT Frequency:  (5) OT Frequency:  (5)            Contractures      Additional Factors Info  Code Status, Allergies Code Status  Info:  (Full Code. ) Allergies Info:  (Aggrenox Aspirin-dipyridamole Er, Augmentin Amoxicillin-pot Clavulanate, Penicillins, Remeron Mirtazapine, Sulfa Antibiotics)           Current Medications (10/20/2016):  This is the current hospital active medication list Current Facility-Administered Medications  Medication Dose Route Frequency Provider Last Rate Last Dose  . acetaminophen (TYLENOL) tablet 650 mg  650 mg Oral Q6H PRN Oralia Manisavid Willis, MD       Or  . acetaminophen (TYLENOL) suppository 650 mg  650 mg Rectal Q6H PRN Oralia Manisavid Willis, MD      . albuterol (PROVENTIL) (2.5 MG/3ML) 0.083% nebulizer solution 3 mL  3 mL Inhalation Q6H PRN Oralia Manisavid Willis, MD      . atorvastatin (LIPITOR) tablet 40 mg  40 mg Oral QPM Oralia Manisavid Willis, MD   40 mg at 10/19/16 2302  . clindamycin (CLEOCIN) IVPB 600 mg  600 mg Intravenous Once Juanell FairlyKevin Krasinski, MD      . diphenhydrAMINE (BENADRYL) capsule 25 mg  25 mg Oral QHS PRN Oralia Manisavid Willis, MD      . gabapentin (NEURONTIN) capsule 300 mg  300 mg Oral BID Oralia Manisavid Willis, MD   300 mg at 10/19/16 2301  . hydrALAZINE (APRESOLINE) tablet 25 mg  25 mg Oral BID Oralia Manisavid Willis, MD   25 mg at 10/19/16 2301  . HYDROmorphone (DILAUDID) injection 0.5 mg  0.5 mg Intravenous Q4H PRN Oralia Manisavid Willis, MD   0.5 mg at 10/20/16 0749  . losartan (  COZAAR) tablet 50 mg  50 mg Oral Daily Oralia Manis, MD   50 mg at 10/19/16 2301  . mometasone-formoterol (DULERA) 200-5 MCG/ACT inhaler 2 puff  2 puff Inhalation BID Oralia Manis, MD   2 puff at 10/20/16 0749  . ondansetron (ZOFRAN) tablet 4 mg  4 mg Oral Q6H PRN Oralia Manis, MD       Or  . ondansetron [facility]) injection 4 mg  4 mg Intravenous Q6H PRN Oralia Manis, MD      . oxyCODONE (Oxy IR/ROXICODONE) immediate release tablet 5 mg  5 mg Oral Q4H PRN Oralia Manis, MD   5 mg at 10/19/16 2332  . oxyCODONE (Oxy IR/ROXICODONE) immediate release tablet 5-10 mg  5-10 mg Oral Q4H PRN Juanell Fairly, MD   10 mg at 10/20/16 0427  . pantoprazole (PROTONIX) EC  tablet 40 mg  40 mg Oral Daily Oralia Manis, MD   40 mg at 10/19/16 2301     Discharge Medications: Please see discharge summary for a list of discharge medications.  Relevant Imaging Results:  Relevant Lab Results:   Additional Information  (SSN: [id number])  Sample, Darleen Crocker, LCSW

## 2016-10-20 NOTE — Progress Notes (Signed)
Notified Dr. Amado CoeGouru that son is concerned that patient is complaining of right shoulder pain and no xray performed. Patient does not want to have xray today. Dr. Amado CoeGouru to order xray for tomorrow.

## 2016-10-20 NOTE — Consult Note (Signed)
ORTHOPAEDIC CONSULTATION  REQUESTING PHYSICIAN: Ramonita LabAruna Gouru, MD  Chief Complaint:   Right hip pain.  History of Present Illness: Donna Quinn is a 81 y.o. female with multiple medical problems who lives independently in her home. Apparently she was bending over to pick up her pocketbook when she lost her balance and fell onto her right side, striking her head and landing on her right hip. She is unable to get up. The emergency rescue team was called. She was brought to the emergency room at [facility] where x-rays demonstrated a displaced right femoral neck fracture. She has been admitted to the medicine service where she has been cleared medically and presents at this time for definitive management of her injury. The patient denies any loss of consciousness or other injury as the result of the fall. She also denies any lightheadedness, dizziness, chest pain, or shortness of breath that may have contributed to her fall.  Past Medical History:  Diagnosis Date  . AAA (abdominal aortic aneurysm) (HCC)   . AAA (abdominal aortic aneurysm) (HCC)   . Abdominal aortic aneurysm (AAA) (HCC)   . Brain aneurysm   . Collagen vascular disease (HCC)   . COPD (chronic obstructive pulmonary disease) (HCC)   . Dyspnea   . GERD (gastroesophageal reflux disease)   . H/O wheezing   . Hypercholesterolemia   . Hypertension   . Neuropathy (HCC)   . Osteoporosis   . Stroke [facility](HCC) 2016   twice   Past Surgical History:  Procedure Laterality Date  . abdominal Bilateral    stents  . BACK SURGERY     cyst removed from back  . BRAIN SURGERY     Brain aneurysm repair, coiling  . CAROTID ENDARTERECTOMY    . CATARACT EXTRACTION W/PHACO Left 07/08/2016   Procedure: CATARACT EXTRACTION PHACO AND INTRAOCULAR LENS PLACEMENT (IOC);  Surgeon: Nevada CraneBradley Mark King, MD;  Location: [facility] ORS;  Service: Ophthalmology;  Laterality: Left;  US  01:30 AP% 39.1 CDE  20.3 Fluid pack lot # [id number]  . CATARACT EXTRACTION W/PHACO Right 09/16/2016   Procedure: CATARACT EXTRACTION PHACO AND INTRAOCULAR LENS PLACEMENT (IOC);  Surgeon: Nevada CraneBradley Mark King, MD;  Location: [facility] ORS;  Service: Ophthalmology;  Laterality: Right;  Lot # [id number] H US: 01"21.7 AP% 16.8: CDE: 13.90   Social History   Social History  . Marital status: Widowed    Spouse name: N/A  . Number of children: N/A  . Years of education: N/A   Social History Main Topics  . Smoking status: Former Games developermoker  . Smokeless tobacco: Never Used  . Alcohol use No  . Drug use: No  . Sexual activity: Not Asked   Other Topics Concern  . None   Social History Narrative  . None   Family History  Problem Relation Age of Onset  . Breast cancer Sister [id number]   Allergies  Allergen Reactions  . Aggrenox [Aspirin-Dipyridamole Er]     A really bad headache  . Augmentin [Amoxicillin-Pot Clavulanate]     unknown  . Penicillins Other (See Comments)    Has patient had a PCN reaction causing immediate rash, facial/tongue/throat swelling, SOB or lightheadedness with hypotension: Yes Has patient had a PCN reaction causing severe rash involving mucus membranes or skin necrosis: Yes Has patient had a PCN reaction that required hospitalization No Has patient had a PCN reaction occurring within the last 10 years: No If all of the above answers are "NO", then may proceed with Cephalosporin use.  Milus Banister. Remeron [Mirtazapine]  Broke out inside the mouth  . Sulfa Antibiotics     Broke out in welps and had to be hospitalized   Prior to Admission medications   Medication Sig Start Date End Date Taking? Authorizing Provider  acetaminophen (TYLENOL) 500 MG tablet Take 1,000 mg by mouth daily as needed for moderate pain or headache.   Yes Historical Provider, MD  albuterol (PROVENTIL HFA;VENTOLIN HFA) 108 (90 Base) MCG/ACT inhaler Inhale 2 puffs into the lungs every 6 (six) hours as needed for wheezing or shortness of  breath.   Yes Historical Provider, MD  atorvastatin (LIPITOR) 40 MG tablet Take 40 mg by mouth every evening.    Yes Historical Provider, MD  budesonide-formoterol (SYMBICORT) 160-4.5 MCG/ACT inhaler Inhale 2 puffs into the lungs 2 (two) times daily.   Yes Historical Provider, MD  Cholecalciferol 2000 units CAPS Take 2,000 Units by mouth every morning.    Yes Historical Provider, MD  clopidogrel (PLAVIX) 75 MG tablet Take 75 mg by mouth every morning.   Yes Historical Provider, MD  diphenhydramine-acetaminophen (TYLENOL PM) 25-500 MG TABS tablet Take 2 tablets by mouth at bedtime as needed (sleep).   Yes Historical Provider, MD  gabapentin (NEURONTIN) 300 MG capsule Take 300 mg by mouth 2 (two) times daily.   Yes Historical Provider, MD  hydrALAZINE (APRESOLINE) 25 MG tablet Take 25 mg by mouth 2 (two) times daily.    Yes Historical Provider, MD  losartan (COZAAR) 50 MG tablet Take 50 mg by mouth daily.    Yes Historical Provider, MD  omeprazole (PRILOSEC) 20 MG capsule Take 20 mg by mouth 2 (two) times daily before a meal.   Yes Historical Provider, MD  vitamin B-12 (CYANOCOBALAMIN) 1000 MCG tablet Take 1,000 mcg by mouth daily.   Yes Historical Provider, MD  polyethylene glycol (MIRALAX / GLYCOLAX) packet Take 17 g by mouth daily as needed for mild constipation. Patient not taking: Reported on 10/19/2016 08/08/16   Ramonita Lab, MD   Dg Chest 1 View  Result Date: 10/19/2016 CLINICAL DATA:  81 year old female status post fall today at 1700 hours, found down at 1830 hours. Right hip fracture. On Plavix. Initial encounter. EXAM: CHEST 1 VIEW COMPARISON:  Chest CTA 08/06/2016 and earlier. FINDINGS: Supine AP view of the chest at 1927 hours. Stable lung volumes. The lungs are clear. No pneumothorax or pleural effusion identified. Eventration of the diaphragm. Stable cardiac size and mediastinal contours. Small to moderate gastric hiatal hernia. Calcified aortic atherosclerosis. Visualized tracheal air  column is within normal limits. IMPRESSION: No acute cardiopulmonary abnormality. Moderate gastric hiatal hernia.  Calcified aortic atherosclerosis. Electronically Signed   By: Odessa Fleming M.D.   On: 10/19/2016 19:56   Ct Head Wo Contrast  Result Date: 10/19/2016 CLINICAL DATA:  81 year old female status post fall today at 1700 hours, found down at 1830 hours. Right hip fracture. On Plavix. Initial encounter. EXAM: CT HEAD WITHOUT CONTRAST TECHNIQUE: Contiguous axial images were obtained from the base of the skull through the vertex without intravenous contrast. COMPARISON:  Head CT without contrast 10/21/2014 FINDINGS: Brain: Left ICA terminus chronic embolization coil pack with streak artifact. 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 Patchy and confluent bilateral cerebral white matter hypodensity. No midline shift, mass effect, or evidence of intracranial mass lesion. No acute intracranial hemorrhage identified. No ventriculomegaly. No cortically based acute infarct identified. Vascular: Calcified atherosclerosis at the skull base. Skull: Intact.  No acute osseous abnormality identified.  Sinuses/Orbits: Visualized paranasal sinuses and mastoids are stable and well pneumatized. Other: Right forehead scalp hematoma measures up to 10 mm in thickness. Underlying right frontal bone appears intact. Interval postoperative changes to both globes. Otherwise negative visible orbits soft tissues. No other scalp soft tissue injury identified. IMPRESSION: 1. Right forehead scalp soft tissue injury without underlying fracture. 2. No acute traumatic injury to the brain. 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 Electronically Signed   By: Odessa Fleming M.D.   On: 10/19/2016 19:59   Dg Hip Unilat W Or Wo Pelvis 2-3 Views Right  Result Date: 10/19/2016 CLINICAL DATA:  81 year old female status post fall today  at 1700 hours, found down at 1830 hours. On Plavix. Initial encounter. EXAM: DG HIP (WITH OR WITHOUT PELVIS) 2-3V RIGHT COMPARISON:  None. FINDINGS: Acute fracture of the right femoral neck appears to be mildly comminuted and there is varus impaction. The right femoral head remains normally located. The intertrochanteric segment and proximal shaft of the right femur appears to remain intact. Left femoral head normally located. Grossly intact proximal left femur. No superimposed pelvis fracture identified. Aortoiliac calcified atherosclerosis noted. Common iliac artery vascular stents. Pelvic phleboliths. Negative visible bowel gas pattern. IMPRESSION: 1. Right femoral neck fracture appears to be mildly comminuted with varus impaction. 2. No other acute fracture about the right hip or pelvis. Right femur intertrochanteric segment appears intact. 3.  Aortoiliac calcified atherosclerosis with vascular stents. Electronically Signed   By: Odessa Fleming M.D.   On: 10/19/2016 19:54    Positive ROS: All other systems have been reviewed and were otherwise negative with the exception of those mentioned in the HPI and as above.  Physical Exam: General:  Alert, no acute distress Psychiatric:  Patient is competent for consent with normal mood and affect   Cardiovascular:  No pedal edema Respiratory:  No wheezing, non-labored breathing GI:  Abdomen is soft and non-tender Skin:  No lesions in the area of chief complaint Neurologic:  Sensation intact distally Lymphatic:  No axillary or cervical lymphadenopathy  Orthopedic Exam:  Orthopedic examination is limited to the right hip and lower extremity. The right lower extremity somewhat shortened and externally rotated as compared to the left. Skin inspection around the right hip is unremarkable. There are no skin rashes, erythema, ecchymosis, abrasions, or other abnormalities identified. She has mild tenderness to palpation over the lateral aspect of the right hip. She has  more significant pain with any attempted active or passive motion of the right hip. She is neurovascularly intact to the right lower extremity and foot in that she is able to actively dorsiflex and plantar flex her toes and ankle. Sensation is intact to light touch to all restrictions. She has good capillary refill to her right foot.  X-rays:  Recent x-rays of the pelvis and right hip are available for review. These films demonstrate a displaced right femoral neck fracture. No significant degenerative changes of the right hip are noted. No lytic lesions or other acute bony abnormalities are identified.  Assessment: Displaced right femoral neck fracture.  Plan: The treatment options are discussed with the patient and her family, who are at the bedside. The patient and her family would like to proceed with surgical intervention to include a right hip hemiarthroplasty. This procedure has been discussed in detail with the patient and her family, as have the potential risks (including bleeding, infection, nerve and/or blood vessel injury, persistent or recurrent pain,  stiffness, leg length inequality, dislocation, loosening of and/or failure of the components, need for further surgery, blood clots, strokes, heart attacks and/or arrhythmias, etc.) and benefits. The patient states her understanding and wishes to proceed. A formal written consent has been obtained by the nursing staff.  Thank you for asking me to produce. In the care of this most pleasant woman. I will be happy to follow her with you.   Maryagnes Amos, MD  Beeper #:  [phone]  10/20/2016 3:23 PM

## 2016-10-20 NOTE — Op Note (Signed)
10/19/2016 - 10/20/2016  5:14 PM  Patient:   Donna GildingBetty M Strome  Pre-Op Diagnosis:   Displaced femoral neck fracture, right hip.  Post-Op Diagnosis:   Same.  Procedure:   Right hip unipolar hemiarthroplasty.  Surgeon:   Maryagnes AmosJ. Jeffrey Deaunna Olarte, MD  Assistant:   Horris LatinoLance McGhee, PA-C  Anesthesia:   GET  Findings:   As above.  Complications:   None  EBL:   125 cc  Fluids:   1200 cc crystalloid  UOP:   400 cc  TT:   None  Drains:   None  Closure:   Staples  Implants:   Biomet press-fit system with a #11 standard offset Echo femoral stem, a 46 mm outer diameter shell, a 28 mm head, and a -3 mm neck  Brief Clinical Note:   The patient is a 81 year old female who sustained the above-noted injury last evening when she apparently lost her balance and fell in her home while reaching for her pocketbook. She presented to the emergency room where x-rays demonstrated the above-noted injury. She has been cleared medically and presents at this time for definitive management of the injury.  Procedure:   The patient was brought into the operating room. After adequate general endotracheal intubation and anesthesia was obtained, the patient was repositioned in the left lateral decubitus position and secured using a lateral hip positioner. The right hip and lower extremity were prepped with ChloroPrep solution before being draped sterilely. Preoperative antibiotics were administered. A timeout was performed to verify the appropriate surgical site before a standard posterior approach to the hip was made through an approximately 4-5 inch incision. The incision was carried down through the subcutaneous tissues to expose the gluteal fascia and proximal end of the iliotibial band. These structures were split the length of the incision and the Charnley self-retaining hip retractor placed. The bursal tissues were swept posteriorly to expose the short external rotators. The anterior border of the piriformis tendon was  identified and this plane developed down through the capsule to enter the joint. Abundant fracture hematoma was suctioned. A flap of tissue was elevated off the posterior aspect of the femoral neck and greater trochanter and retracted posteriorly. This flap included the piriformis tendon, the short external rotators, and the posterior capsule. The femoral head was removed in its entirety, then taken to the back table where it was measured and found to be optimally replicated by a 46 mm head. The appropriate trial head was inserted and found to demonstrate an excellent suction fit.   Attention was directed to the femoral side. The femoral neck was recut 10-12 mm above the lesser trochanter using an oscillating saw. The piriformis fossa was debrided of soft tissues before the intramedullary canal was accessed through this point using a triple step reamer. The canal was reamed sequentially beginning with a #7 tapered reamer and progressing to a #11 tapered reamer. This provided excellent circumferential chatter. A box osteotome was used to establish version before the canal was broached sequentially beginning with a #7 broach and progressing to a #11 broach. This was left in place and several trial reductions performed. The permanent standard offset #11 femoral stem was impacted into place. A repeat trial reduction was performed using both the -6 mm and -3 mm neck lengths. The -3 mm neck length demonstrated excellent stability both in extension and external rotation as well as with flexion to 90 and internal rotation beyond 70. It also was stable in the position of sleep. The 46  mm outer diameter shell with the -3 mm neck adapter construct was put together on the back table before being impacted onto the stem of the femoral component. The Morse taper locking mechanism was verified using manual distraction before the head was relocated and placed through a range of motion with the findings as described above.  The  wound was copiously irrigated with bacitracin saline solution via the jet lavage system before the peri-incisional and pericapsular tissues were injected with 30 cc of 0.5% Sensorcaine with epinephrine and 20 cc of Exparel diluted out to 60 cc with normal saline to help with postoperative analgesia. The posterior flap was reapproximated to the posterior aspect of the greater trochanter using #2 Tycron interrupted sutures placed through drill holes. Several additional #2 Tycron interrupted sutures were used to reinforce this layer of closure. The iliotibial band was reapproximated using #1 Vicryl interrupted sutures before the gluteal fascia was closed using a running #1 Vicryl suture. At this point, 1 g of transexemic acid in 10 cc of normal saline was injected into the joint to help reduce postoperative bleeding. The subcutaneous tissues were closed in several layers using 2-0 Vicryl interrupted sutures before the skin was closed using staples. A sterile occlusive dressing was applied to the wound before the patient was placed into an abduction wedge pillow. The patient was then rolled back into the supine position on the hospital bed before brought over to the intensive care unit while still intubated as per the recommendation of the anesthesiologist, given her poor preoperative respiratory status, after appearing to have tolerated the procedure well.

## 2016-10-20 NOTE — Care Management Note (Signed)
Case Management Note  Patient Details  Name: Donna Quinn MRN: [id number] Date of Birth: 1935-11-02  Subjective/Objective:                  Met with patient and her family as she waits to have surgery today with Dr. Roland Rack. She uses [facility] in [place] for medications.   Action/Plan: Home health list left with patient's son.   Expected Discharge Date:  10/22/16               Expected Discharge Plan:     In-House Referral:     Discharge planning Services  CM Consult  Post Acute Care Choice:  Durable Medical Equipment, Home Health Choice offered to:  Adult Children, Patient  DME Arranged:    DME Agency:     HH Arranged:    HH Agency:     Status of Service:  In process, will continue to follow  If discussed at Long Length of Stay Meetings, dates discussed:    Additional Comments:  Marshell Garfinkel, RN 10/20/2016, 11:47 AM

## 2016-10-20 NOTE — Clinical Social Work Note (Signed)
Clinical Social Work Assessment  Patient Details  Name: Donna Quinn MRN: [id number] Date of Birth: 12-13-1935  Date of referral:  10/20/16               Reason for consult:  Facility Placement                Permission sought to share information with:  Chartered certified accountant granted to share information::  Yes, Verbal Permission Granted  Name::      [place]::   [place]   Relationship::     Contact Information:     Housing/Transportation Living arrangements for the past 2 months:  [place] of Information:  Patient, Adult Children Patient Interpreter Needed:  None Criminal Activity/Legal Involvement Pertinent to Current Situation/Hospitalization:  No - Comment as needed Significant Relationships:  Adult Children Lives with:  Self Do you feel safe going back to the place where you live?  Yes Need for family participation in patient care:  Yes (Comment)  Care giving concerns:  Patient lives in [place] alone and has 6 adult children.    Social Worker assessment / plan:  Holiday representative (CSW) reviewed chart and noted that patient will have surgery today for a hip fracture. CSW met with patient and her 2 adult daughters Butch Penny and Rise Paganini prior to surgery. Patient reported that she lives alone in [place] and has no HPOA. Per daughters patient's daughter Anne Ng [phone] will be the main contact and works at [employer]. CSW explained that after surgery PT will work with patient and make a recommendation of home health or SNF. CSW also explained that patient's Mcarthur Rossetti will have to approve SNF. Patient and daughters verbalized their understanding and are agreeable to SNF search in [place]. FL2 complete and faxed out. CSW will continue to follow and assist as needed.   Employment status:  Disabled (Comment on whether or not currently receiving Disability), Retired Programmer, applications PT Recommendations:  No Follow Up Information / Referral to community resources:  [place]  Patient/Family's Response to care:  Patient and her family are agreeable to SNF search in [place].   Patient/Family's Understanding of and Emotional Response to Diagnosis, Current Treatment, and Prognosis:  Patient and daughters were very pleasant and thanked CSW for visit.   Emotional Assessment Appearance:  Appears stated age Attitude/Demeanor/Rapport:    Affect (typically observed):  Accepting, Adaptable, Pleasant Orientation:  Oriented to Self, Oriented to Place, Oriented to  Time, Oriented to Situation Alcohol / Substance use:  Not Applicable Psych involvement (Current and /or in the community):  No (Comment)  Discharge Needs  Concerns to be addressed:  Discharge Planning Concerns Readmission within the last 30 days:  No Current discharge risk:  Dependent with Mobility Barriers to Discharge:  Continued Medical Work up   [employer], Veronia Beets, LCSW 10/20/2016, 11:14 AM

## 2016-10-20 NOTE — Transfer of Care (Signed)
Immediate Anesthesia Transfer of Care Note  Patient: Donna Quinn  Procedure(s) Performed: Procedure(s): ARTHROPLASTY BIPOLAR HIP (HEMIARTHROPLASTY) (Right)  Patient Location: ICU  Anesthesia Type:General  Level of Consciousness: Patient remains intubated per anesthesia plan  Airway & Oxygen Therapy: Patient remains intubated per anesthesia plan and Patient placed on Ventilator (see vital sign flow sheet for setting)  Post-op Assessment: Report given to RN  Post vital signs: Reviewed  Last Vitals:  Vitals:   10/20/16 1350 10/20/16 1737  BP: (!) 154/57 (!) 173/77  Pulse: 90 73  Resp: 20 17  Temp: 37.6 C 36.3 C    Last Pain:  Vitals:   10/20/16 1350  TempSrc: Tympanic  PainSc: 8          Complications: No apparent anesthesia complications

## 2016-10-20 NOTE — Progress Notes (Addendum)
[facility] - [place] at [facility]   PATIENT NAME: Donna Quinn    MR#:  [id number]  DATE OF BIRTH:  Jun 22, 1936  SUBJECTIVE:  CHIEF COMPLAINT:  Pain is manageable patient is resting comfortably denies any chest pain or shortness of breath has old history of stroke  REVIEW OF SYSTEMS:  CONSTITUTIONAL: No fever, fatigue or weakness.  EYES: No blurred or double vision.  EARS, NOSE, AND THROAT: No tinnitus or ear pain.  RESPIRATORY: No cough, shortness of breath, wheezing or hemoptysis.  CARDIOVASCULAR: No chest pain, orthopnea, edema.  GASTROINTESTINAL: No nausea, vomiting, diarrhea or abdominal pain.  GENITOURINARY: No dysuria, hematuria.  ENDOCRINE: No polyuria, nocturia,  HEMATOLOGY: No anemia, easy bruising or bleeding SKIN: No rash or lesion. MUSCULOSKELETAL: Reporting right hip pain   NEUROLOGIC: No tingling, numbness, weakness.  PSYCHIATRY: No anxiety or depression.   DRUG ALLERGIES:   Allergies  Allergen Reactions  . Aggrenox [Aspirin-Dipyridamole Er]     A really bad headache  . Augmentin [Amoxicillin-Pot Clavulanate]     unknown  . Penicillins Other (See Comments)    Has patient had a PCN reaction causing immediate rash, facial/tongue/throat swelling, SOB or lightheadedness with hypotension: Yes Has patient had a PCN reaction causing severe rash involving mucus membranes or skin necrosis: Yes Has patient had a PCN reaction that required hospitalization No Has patient had a PCN reaction occurring within the last 10 years: No If all of the above answers are "NO", then may proceed with Cephalosporin use.  . Remeron [Mirtazapine]     Broke out inside the mouth  . Sulfa Antibiotics     Broke out in welps and had to be hospitalized    VITALS:  Blood pressure (!) 154/57, pulse 90, temperature 99.7 F (37.6 C), temperature source Tympanic, resp. rate 20, height 5\' 6"  (1.676 m), weight 63.5 kg (140 lb), SpO2 95 %.  PHYSICAL EXAMINATION:   GENERAL:  81 y.o.-year-old patient lying in the bed with no acute distress.  EYES: Pupils equal, round, reactive to light and accommodation. No scleral icterus. Extraocular muscles intact.  HEENT: Patient is normocephalic. Oropharynx and nasopharynx clear.  NECK:  Supple, no jugular venous distention. No thyroid enlargement, no tenderness.  LUNGS: Normal breath sounds bilaterally, no wheezing, rales,rhonchi or crepitation. No use of accessory muscles of respiration.  CARDIOVASCULAR: S1, S2 normal. No murmurs, rubs, or gallops.  ABDOMEN: Soft, nontender, nondistended. Bowel sounds present. No organomegaly or mass.  EXTREMITIES: Right hip is tender No pedal edema, cyanosis, or clubbing.  NEUROLOGIC: Cranial nerves II through XII are intact. Muscle strength 5/5 in all extremities. Sensation intact. Gait not checked.  PSYCHIATRIC: The patient is alert and oriented x 3.  SKIN: No obvious rash, lesion, or ulcer.    LABORATORY PANEL:   CBC  Recent Labs Lab 10/20/16 0418  WBC 6.2  HGB 10.5*  HCT 30.5*  PLT 169   ------------------------------------------------------------------------------------------------------------------  Chemistries   Recent Labs Lab 10/20/16 0418  NA 141  K 4.6  CL 107  CO2 28  GLUCOSE 110*  BUN 21*  CREATININE 1.21*  CALCIUM 8.6*   ------------------------------------------------------------------------------------------------------------------  Cardiac Enzymes No results for input(s): TROPONINI in the last 168 hours. ------------------------------------------------------------------------------------------------------------------  RADIOLOGY:  Dg Chest 1 View  Result Date: 10/19/2016 CLINICAL DATA:  81 year old female status post fall today at 1700 hours, found down at 1830 hours. Right hip fracture. On Plavix. Initial encounter. EXAM: CHEST 1 VIEW COMPARISON:  Chest CTA 08/06/2016 and earlier. FINDINGS: Supine AP view  of the chest at 1927 hours.  Stable lung volumes. The lungs are clear. No pneumothorax or pleural effusion identified. Eventration of the diaphragm. Stable cardiac size and mediastinal contours. Small to moderate gastric hiatal hernia. Calcified aortic atherosclerosis. Visualized tracheal air column is within normal limits. IMPRESSION: No acute cardiopulmonary abnormality. Moderate gastric hiatal hernia.  Calcified aortic atherosclerosis. Electronically Signed   By: Odessa FlemingH  Hall M.D.   On: 10/19/2016 19:56   Ct Head Wo Contrast  Result Date: 10/19/2016 CLINICAL DATA:  81 year old female status post fall today at 1700 hours, found down at 1830 hours. Right hip fracture. On Plavix. Initial encounter. EXAM: CT HEAD WITHOUT CONTRAST TECHNIQUE: Contiguous axial images were obtained from the base of the skull through the vertex without intravenous contrast. COMPARISON:  Head CT without contrast 10/21/2014 FINDINGS: Brain: Left ICA terminus chronic embolization coil pack with streak artifact. 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 Patchy and confluent bilateral cerebral white matter hypodensity. No midline shift, mass effect, or evidence of intracranial mass lesion. No acute intracranial hemorrhage identified. No ventriculomegaly. No cortically based acute infarct identified. Vascular: Calcified atherosclerosis at the skull base. Skull: Intact.  No acute osseous abnormality identified. Sinuses/Orbits: Visualized paranasal sinuses and mastoids are stable and well pneumatized. Other: Right forehead scalp hematoma measures up to 10 mm in thickness. Underlying right frontal bone appears intact. Interval postoperative changes to both globes. Otherwise negative visible orbits soft tissues. No other scalp soft tissue injury identified. IMPRESSION: 1. Right forehead scalp soft tissue injury without underlying fracture. 2. No acute traumatic injury to the brain. Chronic small vessel ischemic disease  and prior coil embolization of left ICA terminus region aneurysm. Electronically Signed   By: Odessa FlemingH  Hall M.D.   On: 10/19/2016 19:59   Dg Hip Unilat W Or Wo Pelvis 2-3 Views Right  Result Date: 10/19/2016 CLINICAL DATA:  81 year old female status post fall today at 1700 hours, found down at 1830 hours. On Plavix. Initial encounter. EXAM: DG HIP (WITH OR WITHOUT PELVIS) 2-3V RIGHT COMPARISON:  None. FINDINGS: Acute fracture of the right femoral neck appears to be mildly comminuted and there is varus impaction. The right femoral head remains normally located. The intertrochanteric segment and proximal shaft of the right femur appears to remain intact. Left femoral head normally located. Grossly intact proximal left femur. No superimposed pelvis fracture identified. Aortoiliac calcified atherosclerosis noted. Common iliac artery vascular stents. Pelvic phleboliths. Negative visible bowel gas pattern. IMPRESSION: 1. Right femoral neck fracture appears to be mildly comminuted with varus impaction. 2. No other acute fracture about the right hip or pelvis. Right femur intertrochanteric segment appears intact. 3.  Aortoiliac calcified atherosclerosis with vascular stents. Electronically Signed   By: Odessa FlemingH  Hall M.D.   On: 10/19/2016 19:54    EKG:   Orders placed or performed during the hospital encounter of 10/19/16  . EKG 12-Lead  . EKG 12-Lead    ASSESSMENT AND PLAN:   #Right foot pain secondary to right femoral neck fracture [facility] risk [facility] patient has only 1 risk factor Medically optimized for surgery Pain management as needed ortho planning surgery today evening  Abnormal urinalysis urine culture is pending. MRSA PCR negative  HTN (hypertension) - continue home meds, blood pressure is currently stable    COPD (HCC) - not in exacerbation, continue as needed inhalers    HLD (hyperlipidemia) - continue home dose statin    GERD (gastroesophageal reflux disease) -  continue home  dose PPI   All the records are reviewed and case discussed with Care Management/Social Workerr. Management plans discussed with the patient, family and they are in agreement.  CODE STATUS: fc  TOTAL TIME TAKING CARE OF THIS PATIENT: 36 minutes.   POSSIBLE D/C IN 2 DAYS, DEPENDING ON CLINICAL CONDITION.  Note: This dictation was prepared with Dragon dictation along with smaller phrase technology. Any transcriptional errors that result from this process are unintentional.   Ramonita Lab M.D on 10/20/2016 at 4:49 PM  Between 7am to 6pm - Pager - [phone] After 6pm go to [email or web address] - password [email or web address] [facility]  [facility]  Office  [phone]  CC: Primary care physician; Marguarite Arbour, MD

## 2016-10-20 NOTE — Anesthesia Procedure Notes (Signed)
Procedure Name: Intubation Date/Time: 10/20/2016 3:43 PM Performed by: Doreen Salvage Pre-anesthesia Checklist: Patient identified, Patient being monitored, Timeout performed, Emergency Drugs available and Suction available Patient Re-evaluated:Patient Re-evaluated prior to inductionOxygen Delivery Method: Circle system utilized Preoxygenation: Pre-oxygenation with 100% oxygen Intubation Type: IV induction Ventilation: Mask ventilation without difficulty Laryngoscope Size: Mac and 3 Grade View: Grade I Tube type: Oral Tube size: 7.0 mm Number of attempts: 1 Airway Equipment and Method: Stylet Placement Confirmation: ETT inserted through vocal cords under direct vision,  positive ETCO2 and breath sounds checked- equal and bilateral Secured at: 21 cm Tube secured with: Tape Dental Injury: Teeth and Oropharynx as per pre-operative assessment

## 2016-10-20 NOTE — Anesthesia Preprocedure Evaluation (Signed)
Anesthesia Evaluation  Patient identified by MRN, date of birth, ID band Patient awake    Reviewed: Allergy & Precautions, NPO status , Patient's Chart, lab work & pertinent test results  History of Anesthesia Complications Negative for: history of anesthetic complications  Airway Mallampati: III  TM Distance: >3 FB Neck ROM: Full    Dental  (+) Edentulous Lower, Edentulous Upper   Pulmonary neg sleep apnea, COPD,  COPD inhaler, former smoker,    breath sounds clear to auscultation- rhonchi (-) wheezing      Cardiovascular hypertension, Pt. on medications + Peripheral Vascular Disease  (-) CAD and (-) Past MI  Rhythm:Regular Rate:Normal - Systolic murmurs and - Diastolic murmurs    Neuro/Psych CVA (L sided weakness), Residual Symptoms negative psych ROS   GI/Hepatic Neg liver ROS, GERD  ,  Endo/Other  negative endocrine ROSneg diabetes  Renal/GU negative Renal ROS     Musculoskeletal R hip fracture    Abdominal (+) - obese,   Peds  Hematology negative hematology ROS (+)   Anesthesia Other Findings Past Medical History: No date: AAA (abdominal aortic aneurysm) (HCC) No date: AAA (abdominal aortic aneurysm) (HCC) No date: Abdominal aortic aneurysm (AAA) (HCC) No date: Brain aneurysm No date: Collagen vascular disease (HCC) No date: COPD (chronic obstructive pulmonary disease) (* No date: Dyspnea No date: GERD (gastroesophageal reflux disease) No date: H/O wheezing No date: Hypercholesterolemia No date: Hypertension No date: Neuropathy (HCC) No date: Osteoporosis 2016: Stroke [facility](HCC)     Comment: twice   Reproductive/Obstetrics                             Anesthesia Physical Anesthesia Plan  ASA: III  Anesthesia Plan: General   Post-op Pain Management:    Induction: Intravenous  Airway Management Planned: Oral ETT  Additional Equipment:   Intra-op Plan:   Post-operative  Plan: Extubation in OR and Possible Post-op intubation/ventilation  Informed Consent: I have reviewed the patients History and Physical, chart, labs and discussed the procedure including the risks, benefits and alternatives for the proposed anesthesia with the patient or authorized representative who has indicated his/her understanding and acceptance.   Dental advisory given  Plan Discussed with: CRNA and Anesthesiologist  Anesthesia Plan Comments: (Discussed possible need for postop intubation and ventilation in ICU, hx of COPD and O2 sat 88-90% on RA preop, improved to mid 90's with 2L [place])        Lab Results  Component Value Date   WBC 6.2 10/20/2016   HGB 10.5 (L) 10/20/2016   HCT 30.5 (L) 10/20/2016   MCV 87.8 10/20/2016   PLT 169 10/20/2016    Anesthesia Quick Evaluation

## 2016-10-20 NOTE — Clinical Social Work Placement (Signed)
   CLINICAL SOCIAL WORK PLACEMENT  NOTE  Date:  10/20/2016  Patient Details  Name: Donna Quinn MRN: [id number] Date of Birth: 10/31/1935  Clinical Social Work is seeking post-discharge placement for this patient at the Skilled  Nursing Facility level of care (*CSW will initial, date and re-position this form in  chart as items are completed):  Yes   Patient/family provided with [place] Clinical Social Work Department's list of facilities offering this level of care within the geographic area requested by the patient (or if unable, by the patient's family).  Yes   Patient/family informed of their freedom to choose among providers that offer the needed level of care, that participate in Medicare, Medicaid or managed care program needed by the patient, have an available bed and are willing to accept the patient.  Yes   Patient/family informed of [facility]'s ownership interest in [facility] and [facility], as well as of the fact that they are under no obligation to receive care at these facilities.  PASRR submitted to EDS on 10/20/16     PASRR number received on 10/20/16     Existing PASRR number confirmed on       FL2 transmitted to all facilities in geographic area requested by pt/family on 10/20/16     FL2 transmitted to all facilities within larger geographic area on       Patient informed that his/her managed care company has contracts with or will negotiate with certain facilities, including the following:            Patient/family informed of bed offers received.  Patient chooses bed at       Physician recommends and patient chooses bed at      Patient to be transferred to   on  .  Patient to be transferred to facility by       Patient family notified on   of transfer.  Name of family member notified:        PHYSICIAN       Additional Comment:    _______________________________________________ Murice Barbar, Darleen CrockerBailey M, LCSW 10/20/2016, 11:13 AM

## 2016-10-20 NOTE — Anesthesia Post-op Follow-up Note (Cosign Needed)
Anesthesia QCDR form completed.        

## 2016-10-21 ENCOUNTER — Encounter: Payer: Self-pay | Admitting: Surgery

## 2016-10-21 LAB — BASIC METABOLIC PANEL
ANION GAP: 4 — AB (ref 5–15)
BUN: 24 mg/dL — AB (ref 6–20)
CO2: 23 mmol/L (ref 22–32)
CREATININE: 1.13 mg/dL — AB (ref 0.44–1.00)
Calcium: 8.1 mg/dL — ABNORMAL LOW (ref 8.9–10.3)
Chloride: 108 mmol/L (ref 101–111)
GFR calc Af Amer: 53 mL/min — ABNORMAL LOW (ref >60)
GFR, EST NON AFRICAN AMERICAN: 45 mL/min — AB (ref >60)
Glucose, Bld: 220 mg/dL — ABNORMAL HIGH (ref 65–99)
Potassium: 4.9 mmol/L (ref 3.5–5.1)
Sodium: 135 mmol/L (ref 135–145)

## 2016-10-21 LAB — CBC WITH DIFFERENTIAL/PLATELET
BASOS PCT: 1 %
Basophils Absolute: 0 10*3/uL (ref 0–0.1)
EOS ABS: 0 10*3/uL (ref 0–0.7)
Eosinophils Relative: 0 %
HEMATOCRIT: 29.9 % — AB (ref 35.0–47.0)
Hemoglobin: 10 g/dL — ABNORMAL LOW (ref 12.0–16.0)
Lymphocytes Relative: 2 %
Lymphs Abs: 0.2 10*3/uL — ABNORMAL LOW (ref 1.0–3.6)
MCH: 29.4 pg (ref 26.0–34.0)
MCHC: 33.3 g/dL (ref 32.0–36.0)
MCV: 88.3 fL (ref 80.0–100.0)
MONO ABS: 0.2 10*3/uL (ref 0.2–0.9)
MONOS PCT: 3 %
NEUTROS ABS: 7.6 10*3/uL — AB (ref 1.4–6.5)
Neutrophils Relative %: 94 %
PLATELETS: 142 10*3/uL — AB (ref 150–440)
RBC: 3.39 MIL/uL — ABNORMAL LOW (ref 3.80–5.20)
RDW: 14.7 % — ABNORMAL HIGH (ref 11.5–14.5)
WBC: 8.1 10*3/uL (ref 3.6–11.0)

## 2016-10-21 LAB — URINE CULTURE: Culture: NO GROWTH

## 2016-10-21 LAB — GLUCOSE, CAPILLARY: GLUCOSE-CAPILLARY: 126 mg/dL — AB (ref 65–99)

## 2016-10-21 NOTE — Progress Notes (Addendum)
PT is recommending SNF. Clinical Education officer, museum (CSW) met with patient and presented bed offers. Patient chose [facility]. CSW contacted patient's daughter Rodena Piety who is in agreement with patient going to [facility]. CSW left [facility] admissions coordinator at message making her aware of above. CSW will continue to follow and assist as needed.   Sharyn Lull called CSW back and reported that she would start [place] authorization today.   [employer], LCSW [phone]

## 2016-10-21 NOTE — Progress Notes (Signed)
Pt transported to 1A by Chasity, NT. All belongings with patient. Pt family aware.

## 2016-10-21 NOTE — Evaluation (Signed)
Physical Therapy Evaluation Patient Details Name: Donna Quinn MRN: [id number] DOB: [birth year] Today's Date: 10/21/2016   History of Present Illness  Pt is a 81 y.o. female presenting to hospital s/p fall sustaining R femoral neck fx (mildly comminuted with varus impaction) and R forehead scalp soft tissue injury.  Pt s/p R hip unipolar hemiarthroplasty (posterior approach) 10/20/16 and transferred to CCU post-op on ventilator and extubated PM of 10/20/16.  PMH includes hiatal hernia, CVA, brain aneurysm, AAA, htn, COPD, CEA.  Clinical Impression  Prior to hospital admission, pt was ambulating independently without AD.  Pt lives alone on main floor of home with steps to enter.  Currently pt requires 2 assist supine to/from sit and min assist x2 to stand with RW.  Pain 6/10 R hip.  Pt appearing SOB and very fatigued after standing limiting activity (pt assisted back to bed).  Pt would benefit from skilled PT to address noted impairments and functional limitations.  D/t impaired activity tolerance, currently recommend QD but will re-assess tomorrow and advance to BID as appropriate.  Recommend pt discharge to STR when medically appropriate.    Follow Up Recommendations SNF    Equipment Recommendations  Rolling walker with 5" wheels    Recommendations for Other Services OT consult     Precautions / Restrictions Precautions Precautions: Fall;Posterior Hip Required Braces or Orthoses: Other Brace/Splint Other Brace/Splint: Hip Abduction wedge in bed Restrictions Weight Bearing Restrictions: Yes RLE Weight Bearing: Weight bearing as tolerated      Mobility  Bed Mobility Overal bed mobility: Needs Assistance Bed Mobility: Supine to Sit;Sit to Supine     Supine to sit: Mod assist;Max assist;+2 for physical assistance Sit to supine: Mod assist;Max assist;+2 for physical assistance   General bed mobility comments: assist for trunk and B LE's; increased assist required d/t R hip pain with  movement  Transfers Overall transfer level: Needs assistance Equipment used: Rolling walker (2 wheeled) Transfers: Sit to/from Stand Sit to Stand: Min assist;+2 physical assistance         General transfer comment: vc's for hand and feet placement  Ambulation/Gait             General Gait Details: Deferred d/t pt fatigue and SOB with standing activity  Stairs            Wheelchair Mobility    Modified Rankin (Stroke Patients Only)       Balance Overall balance assessment: Needs assistance;History of Falls Sitting-balance support: Bilateral upper extremity supported;Feet unsupported Sitting balance-Leahy Scale: Fair Sitting balance - Comments: static sitting   Standing balance support: Bilateral upper extremity supported (on RW) Standing balance-Leahy Scale: Fair Standing balance comment: static standing                             Pertinent Vitals/Pain Pain Assessment: 0-10 Pain Score: 6  Pain Location: L hip Pain Descriptors / Indicators: Sore Pain Intervention(s): Limited activity within patient's tolerance;Monitored during session;Repositioned (RN notified of pt's pain levels)  Vitals (HR and O2) stable and WFL throughout treatment session.  Pt's BP 134/108 beginning of session and increased to 166/80 end of session (nursing notified). No c/o pain through B UE's or L LE with WB'ing activities.    Home Living Family/patient expects to be discharged to:: Private residence Living Arrangements: Alone Available Help at Discharge: Family Type of Home: House Home Access: Stairs to enter   [employer] of Steps: 1 plus 2 steps  with hand bars Home Layout: Two level;Able to live on main level with bedroom/bathroom (stays main level) Home Equipment:  (pt reports having a walker that was her husband's but not sure if RW or rollator)      Prior Function Level of Independence: Independent         Comments: Pt reports a fall 2 weeks  ago as well as most recent fall with hip fx.     Hand Dominance        Extremity/Trunk Assessment   Upper Extremity Assessment Upper Extremity Assessment: Generalized weakness    Lower Extremity Assessment Lower Extremity Assessment: RLE deficits/detail;LLE deficits/detail RLE Deficits / Details: hip flexion at least 2/5 (limited d/t pain); knee flexion/extension at least 3/5; DF at least 3+/5 RLE: Unable to fully assess due to pain LLE Deficits / Details: hip flexion, knee flexion/extension, and DF at least 4/5       Communication   Communication: No difficulties  Cognition Arousal/Alertness: Awake/alert Behavior During Therapy: WFL for tasks assessed/performed Overall Cognitive Status: Within Functional Limits for tasks assessed                      General Comments General comments (skin integrity, edema, etc.): Pt's daughter present end of session when pt was already back in bed.  Nursing cleared pt for participation in physical therapy.  Pt agreeable to PT session.    Exercises Total Joint Exercises Ankle Circles/Pumps: AROM;Strengthening;Both;10 reps;Supine Quad Sets: AROM;Strengthening;Both;10 reps;Supine Gluteal Sets: AROM;Strengthening;Both;10 reps;Supine Heel Slides: AAROM;Strengthening;Both;10 reps;Supine Hip ABduction/ADduction: AAROM;Strengthening;Both;10 reps;Supine  Pt requiring vc's for above exercise technique.   Assessment/Plan    PT Assessment Patient needs continued PT services  PT Problem List Decreased strength;Decreased activity tolerance;Decreased balance;Decreased mobility;Decreased knowledge of use of DME;Decreased knowledge of precautions;Pain       PT Treatment Interventions DME instruction;Gait training;Stair training;Functional mobility training;Therapeutic activities;Therapeutic exercise;Balance training;Patient/family education    PT Goals (Current goals can be found in the Care Plan section)  Acute Rehab PT Goals Patient  Stated Goal: to be able to walk again PT Goal Formulation: With patient Time For Goal Achievement: 11/04/16 Potential to Achieve Goals: Good    Frequency 7X/week   Barriers to discharge Decreased caregiver support      Co-evaluation               End of Session Equipment Utilized During Treatment: Gait belt Activity Tolerance: Patient limited by fatigue;Patient limited by pain Patient left: in bed;with call bell/phone within reach;with bed alarm set;with family/visitor present;with SCD's reapplied (Hip abductor wedge in place; towel rolls in place elevating B heels) Nurse Communication: Mobility status;Patient requests pain meds;Precautions;Weight bearing status PT Visit Diagnosis: History of falling (Z91.81);Difficulty in walking, not elsewhere classified (R26.2);Pain Pain - Right/Left: Right Pain - part of body: Hip         Time: [id number] PT Time Calculation (min) (ACUTE ONLY): 40 min   Charges:   PT Evaluation $PT Eval Low Complexity: 1 Procedure PT Treatments $Therapeutic Exercise: 8-22 mins $Therapeutic Activity: 8-22 mins   PT G CodesHendricks Limes:        Spenser Harren, PT 10/21/16, 1:35 PM [phone]

## 2016-10-21 NOTE — Progress Notes (Deleted)
Name: Donna Quinn MRN: [id number] DOB: 05/11/1936    ADMISSION DATE:  10/19/2016 CONSULTATION DATE:  10/20/16  REFERRING MD :  Dr. Leron CroakJohn Poggi  CHIEF COMPLAINT:  Fall  BRIEF PATIENT DESCRIPTION:  Ms. Donna Quinn is a 81 y.o. Female who [facility] ED on 3/13 status post fall at home.  X-rays were concerning for right femoral neck fracture, subsequently she was [facility] Med[facility] on 3/13 for further management of hip fracture and consultation with Orthopedic surgery.  On 3/14 she had right hip unipolar hemiarthroplasty performed by Dr. Joice LoftsPoggi, and post-op she remains on ventilator and is transferred to ICU .  PCCM is consulted for further vent management.   SIGNIFICANT EVENTS  3/13>> [facility] s/p fall  3/14>> Right hip unipolar Hemiarthroplasty performed by Dr. Joice LoftsPoggi 3/14>> Transfer to ICU post-op and PCCM consulted 3/14 >Extubated  STUDIES:  3/13 CXR>> Supine AP view of the chest at 1927 hours. Stable lung volumes. The lungs are clear. No pneumothorax or pleural effusion identified. Eventration of the diaphragm. Stable cardiac size and mediastinal contours. Small to moderate gastric hiatal hernia. Calcified aortic atherosclerosis. Visualized tracheal air column is within normal limits. IMPRESSION: No acute cardiopulmonary abnormality. Moderate gastric hiatal hernia.  Calcified aortic atherosclerosis. 3/13 CT Head>> 1. Right forehead scalp soft tissue injury without underlying Fracture. 2. No acute traumatic injury to the brain. 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   Prior to Admission medications   Medication Sig Start Date End Date Taking? Authorizing Provider  acetaminophen (TYLENOL) 500 MG tablet Take 1,000 mg by mouth daily as needed for moderate pain or headache.   Yes Historical Provider, MD  albuterol (PROVENTIL HFA;VENTOLIN HFA) 108 (90 Base) MCG/ACT inhaler Inhale 2  puffs into the lungs every 6 (six) hours as needed for wheezing or shortness of breath.   Yes Historical Provider, MD  atorvastatin (LIPITOR) 40 MG tablet Take 40 mg by mouth every evening.    Yes Historical Provider, MD  budesonide-formoterol (SYMBICORT) 160-4.5 MCG/ACT inhaler Inhale 2 puffs into the lungs 2 (two) times daily.   Yes Historical Provider, MD  Cholecalciferol 2000 units CAPS Take 2,000 Units by mouth every morning.    Yes Historical Provider, MD  clopidogrel (PLAVIX) 75 MG tablet Take 75 mg by mouth every morning.   Yes Historical Provider, MD  diphenhydramine-acetaminophen (TYLENOL PM) 25-500 MG TABS tablet Take 2 tablets by mouth at bedtime as needed (sleep).   Yes Historical Provider, MD  gabapentin (NEURONTIN) 300 MG capsule Take 300 mg by mouth 2 (two) times daily.   Yes Historical Provider, MD  hydrALAZINE (APRESOLINE) 25 MG tablet Take 25 mg by mouth 2 (two) times daily.    Yes Historical Provider, MD  losartan (COZAAR) 50 MG tablet Take 50 mg by mouth daily.    Yes Historical Provider, MD  omeprazole (PRILOSEC) 20 MG capsule Take 20 mg by mouth 2 (two) times daily before a meal.   Yes Historical Provider, MD  vitamin B-12 (CYANOCOBALAMIN) 1000 MCG tablet Take 1,000 mcg by mouth daily.   Yes Historical Provider, MD  polyethylene glycol (MIRALAX / GLYCOLAX) packet Take 17 g by mouth daily as needed for mild constipation. Patient not taking: Reported on 10/19/2016 08/08/16   Donna LabAruna Gouru, MD   Allergies  Allergen Reactions  . Aggrenox [Aspirin-Dipyridamole Er]     A really bad headache  . Augmentin [Amoxicillin-Pot Clavulanate]  unknown  . Penicillins Other (See Comments)    Has patient had a PCN reaction causing immediate rash, facial/tongue/throat swelling, SOB or lightheadedness with hypotension: Yes Has patient had a PCN reaction causing severe rash involving mucus membranes or skin necrosis: Yes Has patient had a PCN reaction that required hospitalization No Has  patient had a PCN reaction occurring within the last 10 years: No If all of the above answers are "NO", then may proceed with Cephalosporin use.  . Remeron [Mirtazapine]     Broke out inside the mouth  . Sulfa Antibiotics     Broke out in welps and had to be hospitalized  REVIEW OF SYSTEMS:   Unable to perform as pt in on ventilator  SUBJECTIVE:  Patient was extubated on 3/14.  Doing well. Afebrile.  No issues overnight.  VITAL SIGNS: Temp:  [97.4 F (36.3 C)-99.7 F (37.6 C)] 97.9 F (36.6 C) (03/14 2000) Pulse Rate:  [64-106] 68 (03/15 0300) Resp:  [9-22] 15 (03/15 0300) BP: (122-173)/(52-80) 133/80 (03/15 0300) SpO2:  [89 %-100 %] 100 % (03/15 0300) FiO2 (%):  [35 %-40 %] 35 % (03/14 2002) Weight:  [63.5 kg (140 lb)-70.3 kg (154 lb 15.7 oz)] 70.3 kg (154 lb 15.7 oz) (03/14 1750)  PHYSICAL EXAMINATION: General: Elderly, Caucasian female, laying in bed, in no acute distress Neuro: Alert, follows commands, PERRL HEENT:  Normocephalic, Atraumatic, Neck supple, No JVD Cardiovascular:  RRR, s1s2, No M/R/G Lungs: Symmetrical expansion, no wheezes,crackles, rhonchi Abdomen:  BS+ x4, Soft, Non-distended, Non-tender  Musculoskeletal:  Normal bulk and tone, 1+ generalized edema Skin:  Dry, Vertical surgical incision to right hip with honeycomb dressing   Recent Labs Lab 10/19/16 1913 10/20/16 0418 10/21/16 0424  NA 138 141 135  K 4.4 4.6 4.9  CL 104 107 108  CO2 26 28 23   BUN 22* 21* 24*  CREATININE 1.11* 1.21* 1.13*  GLUCOSE 131* 110* 220*    Recent Labs Lab 10/19/16 1913 10/20/16 0418 10/21/16 0424  HGB 12.5 10.5* 10.0*  HCT 37.4 30.5* 29.9*  WBC 9.2 6.2 8.1  PLT 201 169 142*   Dg Chest 1 View  Result Date: 10/19/2016 CLINICAL DATA:  81 year old female status post fall today at 1700 hours, found down at 1830 hours. Right hip fracture. On Plavix. Initial encounter. EXAM: CHEST 1 VIEW COMPARISON:  Chest CTA 08/06/2016 and earlier. FINDINGS: Supine AP view of the  chest at 1927 hours. Stable lung volumes. The lungs are clear. No pneumothorax or pleural effusion identified. Eventration of the diaphragm. Stable cardiac size and mediastinal contours. Small to moderate gastric hiatal hernia. Calcified aortic atherosclerosis. Visualized tracheal air column is within normal limits. IMPRESSION: No acute cardiopulmonary abnormality. Moderate gastric hiatal hernia.  Calcified aortic atherosclerosis. Electronically Signed   By: Odessa Fleming M.D.   On: 10/19/2016 19:56   Ct Head Wo Contrast  Result Date: 10/19/2016 CLINICAL DATA:  81 year old female status post fall today at 1700 hours, found down at 1830 hours. Right hip fracture. On Plavix. Initial encounter. EXAM: CT HEAD WITHOUT CONTRAST TECHNIQUE: Contiguous axial images were obtained from the base of the skull through the vertex without intravenous contrast. COMPARISON:  Head CT without contrast 10/21/2014 FINDINGS: Brain: Left ICA terminus chronic embolization coil pack with streak artifact. 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 Patchy and confluent bilateral cerebral white matter hypodensity. No midline shift, mass effect, or evidence of intracranial mass lesion. No acute intracranial hemorrhage identified.  No ventriculomegaly. No cortically based acute infarct identified. Vascular: Calcified atherosclerosis at the skull base. Skull: Intact.  No acute osseous abnormality identified. Sinuses/Orbits: Visualized paranasal sinuses and mastoids are stable and well pneumatized. Other: Right forehead scalp hematoma measures up to 10 mm in thickness. Underlying right frontal bone appears intact. Interval postoperative changes to both globes. Otherwise negative visible orbits soft tissues. No other scalp soft tissue injury identified. IMPRESSION: 1. Right forehead scalp soft tissue injury without underlying fracture. 2. No acute traumatic injury to the brain. 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 Electronically Signed   By: Odessa Fleming M.D.   On: 10/19/2016 19:59   Dg Chest Port 1 View  Result Date: 10/20/2016 CLINICAL DATA:  Post intubation EXAM: PORTABLE CHEST 1 VIEW COMPARISON:  10/19/2016 FINDINGS: Endotracheal tube is 5 cm above the carina. Mild hyperinflation/ COPD. Linear scarring or atelectasis in the lower lungs. Heart is upper limits normal in size. Moderate-sized hiatal hernia. No effusions. IMPRESSION: Endotracheal tube 5 cm above the carina. Bibasilar scarring or atelectasis. COPD. Moderate hiatal hernia. Electronically Signed   By: Charlett Nose M.D.   On: 10/20/2016 18:31   Dg Hip Unilat W Or W/o Pelvis 2-3 Views Right  Result Date: 10/20/2016 CLINICAL DATA:  Post pop right hip hemiarthroplasty EXAM: DG HIP (WITH OR WITHOUT PELVIS) 2-3V RIGHT COMPARISON:  10/19/2016 FINDINGS: Changes of right hip replacement noted. No hardware complicating feature. Normal alignment. No acute fracture, subluxation or dislocation. IMPRESSION: Right hip replacement.  No hardware complicating feature. Electronically Signed   By: Charlett Nose M.D.   On: 10/20/2016 18:31   Dg Hip Unilat W Or Wo Pelvis 2-3 Views Right  Result Date: 10/19/2016 CLINICAL DATA:  81 year old female status post fall today at 1700 hours, found down at 1830 hours. On Plavix. Initial encounter. EXAM: DG HIP (WITH OR WITHOUT PELVIS) 2-3V RIGHT COMPARISON:  None. FINDINGS: Acute fracture of the right femoral neck appears to be mildly comminuted and there is varus impaction. The right femoral head remains normally located. The intertrochanteric segment and proximal shaft of the right femur appears to remain intact. Left femoral head normally located. Grossly intact proximal left femur. No superimposed pelvis fracture identified. Aortoiliac calcified atherosclerosis noted. Common iliac artery vascular stents. Pelvic phleboliths. Negative visible bowel  gas pattern. IMPRESSION: 1. Right femoral neck fracture appears to be mildly comminuted with varus impaction. 2. No other acute fracture about the right hip or pelvis. Right femur intertrochanteric segment appears intact. 3.  Aortoiliac calcified atherosclerosis with vascular stents. Electronically Signed   By: Odessa Fleming M.D.   On: 10/19/2016 19:54    ASSESSMENT / PLAN:   A: Right hip unipolar hemiarthroplasty Hx: COPD Hypertension P: Maintain O2 sats >88% Continue prn Albuterol nebs Oxycodone for pain Incentive Spirometry Rest per primary   Patient is extubated and doing well at this time.  PCCM will sign off now.  Will be available if any further assistance is needed.  Thank you for enabling [place] to take care of this patient.     Shenise Wolgamott,AG-ACNP Pulmonary & Critical Care 10/21/2016, 5:21 AM

## 2016-10-21 NOTE — Progress Notes (Signed)
Subjective: 1 Day Post-Op Procedure(s) (LRB): ARTHROPLASTY BIPOLAR HIP (HEMIARTHROPLASTY) (Right) Patient reports pain as mild.   Patient is well, admitted to ICU due to hypoxia, improved, no longer on vent. Plan is to go Skilled nursing facility after hospital stay. Negative for chest pain and shortness of breath Fever: no Gastrointestinal:Negative for nausea and vomiting  Objective: Vital signs in last 24 hours: Temp:  [97.4 F (36.3 C)-99.7 F (37.6 C)] 97.8 F (36.6 C) (03/15 0700) Pulse Rate:  [64-106] 99 (03/15 1200) Resp:  [9-22] 22 (03/15 1200) BP: (119-173)/(52-108) 166/80 (03/15 1200) SpO2:  [91 %-100 %] 93 % (03/15 1200) FiO2 (%):  [35 %-40 %] 35 % (03/14 2002) Weight:  [63.5 kg (140 lb)-70.3 kg (154 lb 15.7 oz)] 70.3 kg (154 lb 15.7 oz) (03/14 1750)  Intake/Output from previous day:  Intake/Output Summary (Last 24 hours) at 10/21/16 1310 Last data filed at 10/21/16 1200  Gross per 24 hour  Intake          3078.75 ml  Output              855 ml  Net          2223.75 ml    Intake/Output this shift: Total I/O In: 725 [P.O.:500; I.V.:225] Out: 255 [Urine:255]  Labs:  Recent Labs  10/19/16 1913 10/20/16 0418 10/21/16 0424  HGB 12.5 10.5* 10.0*    Recent Labs  10/20/16 0418 10/21/16 0424  WBC 6.2 8.1  RBC 3.48* 3.39*  HCT 30.5* 29.9*  PLT 169 142*    Recent Labs  10/20/16 0418 10/21/16 0424  NA 141 135  K 4.6 4.9  CL 107 108  CO2 28 23  BUN 21* 24*  CREATININE 1.21* 1.13*  GLUCOSE 110* 220*  CALCIUM 8.6* 8.1*    Recent Labs  10/19/16 1913  INR 0.94     EXAM General - Patient is Alert, Appropriate and Oriented Extremity - ABD soft Sensation intact distally Intact pulses distally Dorsiflexion/Plantar flexion intact Incision: dressing C/D/I No cellulitis present Dressing/Incision - clean, dry, no drainage Motor Function - intact, moving foot and toes well on exam.  Abdomen soft with normal BS.  Past Medical History:   Diagnosis Date  . AAA (abdominal aortic aneurysm) (HCC)   . AAA (abdominal aortic aneurysm) (HCC)   . Abdominal aortic aneurysm (AAA) (HCC)   . Brain aneurysm   . Collagen vascular disease (HCC)   . COPD (chronic obstructive pulmonary disease) (HCC)   . Dyspnea   . GERD (gastroesophageal reflux disease)   . H/O wheezing   . Hypercholesterolemia   . Hypertension   . Neuropathy (HCC)   . Osteoporosis   . Stroke (HCC) 2016   twice    Assessment/Plan: 1 Day Post-Op Procedure(s) (LRB): ARTHROPLASTY BIPOLAR HIP (HEMIARTHROPLASTY) (Right) Principal Problem:   Fracture of femoral neck, right (HCC) Active Problems:   HTN (hypertension)   HLD (hyperlipidemia)   COPD with acute bronchitis (HCC)   GERD (gastroesophageal reflux disease)  Estimated body mass index is 25.01 kg/m as calculated from the following:   Height as of this encounter: 5\' 6"  (1.676 m).   Weight as of this encounter: 70.3 kg (154 lb 15.7 oz). Advance diet Up with therapy   Labs reviewed, no fevers this AM, WBC 8.1 K+ 4.9 this AM. Transfer back to floor later today if remains stable. Up with therapy, likely will need some short SNF placement.  DVT Prophylaxis - Lovenox, Foot Pumps and TED hose Weight-Bearing as tolerated to  right leg  J. Horris LatinoLance Maddox Bratcher, PA-C [facility] 10/21/2016, 1:10 PM

## 2016-10-21 NOTE — Progress Notes (Signed)
Name: Donna Quinn MRN:   [id number] DOB:   08/08/1936             ADMISSION DATE:  10/19/2016 CONSULTATION DATE:  10/20/16  REFERRING MD :  Dr. Leron Croak  CHIEF COMPLAINT:  Fall  BRIEF PATIENT DESCRIPTION:  Ms. Brooker is a 81 y.o. Female who presented to [facility] ED on 3/13 status post fall at home.  X-rays were concerning for right femoral neck fracture, subsequently she was admitted to [facility] Med/surg unit on 3/13 for further management of hip fracture and consultation with Orthopedic surgery.  On 3/14 she had right hip unipolar hemiarthroplasty performed by Dr. Joice Lofts, and post-op she remains on ventilator and is transferred to ICU .  PCCM is consulted for further vent management.   SIGNIFICANT EVENTS  3/13>> Admission to [facility] Med/surg unit s/p fall  3/14>> Right hip unipolar Hemiarthroplasty performed by Dr. Joice Lofts 3/14>> Transfer to ICU post-op and PCCM consulted 3/14 >Extubated  STUDIES:  3/13 CXR>> Supine AP view of the chest at 1927 hours. Stable lung volumes. The lungs are clear. No pneumothorax or pleural effusion identified. Eventration of the diaphragm. Stable cardiac size and mediastinal contours. Small to moderate gastric hiatal hernia. Calcified aortic atherosclerosis. Visualized tracheal air column is within normal limits. IMPRESSION: No acute cardiopulmonary abnormality. Moderate gastric hiatal hernia. Calcified aortic atherosclerosis. 3/13 CT Head>> 1. Right forehead scalp soft tissue injury without underlying Fracture. 2. No acute traumatic injury to the brain. 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          Prior to Admission medications   Medication Sig Start Date End Date Taking? Authorizing Provider  acetaminophen (TYLENOL) 500 MG tablet Take 1,000 mg by mouth daily as needed for moderate pain or headache.   Yes Historical Provider, MD  albuterol (PROVENTIL HFA;VENTOLIN HFA) 108 (90 Base)  MCG/ACT inhaler Inhale 2 puffs into the lungs every 6 (six) hours as needed for wheezing or shortness of breath.   Yes Historical Provider, MD  atorvastatin (LIPITOR) 40 MG tablet Take 40 mg by mouth every evening.    Yes Historical Provider, MD  budesonide-formoterol (SYMBICORT) 160-4.5 MCG/ACT inhaler Inhale 2 puffs into the lungs 2 (two) times daily.   Yes Historical Provider, MD  Cholecalciferol 2000 units CAPS Take 2,000 Units by mouth every morning.    Yes Historical Provider, MD  clopidogrel (PLAVIX) 75 MG tablet Take 75 mg by mouth every morning.   Yes Historical Provider, MD  diphenhydramine-acetaminophen (TYLENOL PM) 25-500 MG TABS tablet Take 2 tablets by mouth at bedtime as needed (sleep).   Yes Historical Provider, MD  gabapentin (NEURONTIN) 300 MG capsule Take 300 mg by mouth 2 (two) times daily.   Yes Historical Provider, MD  hydrALAZINE (APRESOLINE) 25 MG tablet Take 25 mg by mouth 2 (two) times daily.    Yes Historical Provider, MD  losartan (COZAAR) 50 MG tablet Take 50 mg by mouth daily.    Yes Historical Provider, MD  omeprazole (PRILOSEC) 20 MG capsule Take 20 mg by mouth 2 (two) times daily before a meal.   Yes Historical Provider, MD  vitamin B-12 (CYANOCOBALAMIN) 1000 MCG tablet Take 1,000 mcg by mouth daily.   Yes Historical Provider, MD  polyethylene glycol (MIRALAX / GLYCOLAX) packet Take 17 g by mouth daily as needed for mild constipation. Patient not taking: Reported on 10/19/2016 08/08/16   Ramonita Lab, MD        Allergies  Allergen  Reactions  . Aggrenox [Aspirin-Dipyridamole Er]     A really bad headache  . Augmentin [Amoxicillin-Pot Clavulanate]     unknown  . Penicillins Other (See Comments)    Has patient had a PCN reaction causing immediate rash, facial/tongue/throat swelling, SOB or lightheadedness with hypotension: Yes Has patient had a PCN reaction causing severe rash involving mucus membranes or skin necrosis: Yes Has  patient had a PCN reaction that required hospitalization No Has patient had a PCN reaction occurring within the last 10 years: No If all of the above answers are "NO", then may proceed with Cephalosporin use.  . Remeron [Mirtazapine]     Broke out inside the mouth  . Sulfa Antibiotics     Broke out in welps and had to be hospitalized  REVIEW OF SYSTEMS:   Unable to perform as pt in on ventilator  SUBJECTIVE:  Patient was extubated on 3/14.  Doing well. Afebrile.  No issues overnight.  VITAL SIGNS: Temp:  [97.4 F (36.3 C)-99.7 F (37.6 C)] 97.9 F (36.6 C) (03/14 2000) Pulse Rate:  [64-106] 68 (03/15 0300) Resp:  [9-22] 15 (03/15 0300) BP: (122-173)/(52-80) 133/80 (03/15 0300) SpO2:  [89 %-100 %] 100 % (03/15 0300) FiO2 (%):  [35 %-40 %] 35 % (03/14 2002) Weight:  [63.5 kg (140 lb)-70.3 kg (154 lb 15.7 oz)] 70.3 kg (154 lb 15.7 oz) (03/14 1750)  PHYSICAL EXAMINATION: General: Elderly, Caucasian female, laying in bed, in no acute distress Neuro: Alert, follows commands, PERRL HEENT:  Normocephalic, Atraumatic, Neck supple, No JVD Cardiovascular:  RRR, s1s2, No M/R/G Lungs: Symmetrical expansion, no wheezes,crackles, rhonchi Abdomen:  BS+ x4, Soft, Non-distended, Non-tender  Musculoskeletal:  Normal bulk and tone, 1+ generalized edema Skin:  Dry, Vertical surgical incision to right hip with honeycomb dressing   Last Labs    Recent Labs Lab 10/19/16 1913 10/20/16 0418 10/21/16 0424  NA 138 141 135  K 4.4 4.6 4.9  CL 104 107 108  CO2 26 28 23   BUN 22* 21* 24*  CREATININE 1.11* 1.21* 1.13*  GLUCOSE 131* 110* 220*      Last Labs    Recent Labs Lab 10/19/16 1913 10/20/16 0418 10/21/16 0424  HGB 12.5 10.5* 10.0*  HCT 37.4 30.5* 29.9*  WBC 9.2 6.2 8.1  PLT 201 169 142*      Imaging Results (Last 48 hours)  Dg Chest 1 View  Result Date: 10/19/2016 CLINICAL DATA:  81 year old female status post fall today at 1700 hours, found down at 1830  hours. Right hip fracture. On Plavix. Initial encounter. EXAM: CHEST 1 VIEW COMPARISON:  Chest CTA 08/06/2016 and earlier. FINDINGS: Supine AP view of the chest at 1927 hours. Stable lung volumes. The lungs are clear. No pneumothorax or pleural effusion identified. Eventration of the diaphragm. Stable cardiac size and mediastinal contours. Small to moderate gastric hiatal hernia. Calcified aortic atherosclerosis. Visualized tracheal air column is within normal limits. IMPRESSION: No acute cardiopulmonary abnormality. Moderate gastric hiatal hernia.  Calcified aortic atherosclerosis. Electronically Signed   By: Odessa FlemingH  Hall M.D.   On: 10/19/2016 19:56   Ct Head Wo Contrast  Result Date: 10/19/2016 CLINICAL DATA:  81 year old female status post fall today at 1700 hours, found down at 1830 hours. Right hip fracture. On Plavix. Initial encounter. EXAM: CT HEAD WITHOUT CONTRAST TECHNIQUE: Contiguous axial images were obtained from the base of the skull through the vertex without intravenous contrast. COMPARISON:  Head CT without contrast 10/21/2014 FINDINGS: Brain: Left ICA terminus chronic embolization coil  pack with streak artifact. 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 Patchy and confluent bilateral cerebral white matter hypodensity. No midline shift, mass effect, or evidence of intracranial mass lesion. No acute intracranial hemorrhage identified. No ventriculomegaly. No cortically based acute infarct identified. Vascular: Calcified atherosclerosis at the skull base. Skull: Intact.  No acute osseous abnormality identified. Sinuses/Orbits: Visualized paranasal sinuses and mastoids are stable and well pneumatized. Other: Right forehead scalp hematoma measures up to 10 mm in thickness. Underlying right frontal bone appears intact. Interval postoperative changes to both globes. Otherwise negative visible orbits soft tissues. No other scalp soft tissue  injury identified. IMPRESSION: 1. Right forehead scalp soft tissue injury without underlying fracture. 2. No acute traumatic injury to the brain. 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 Electronically Signed   By: Odessa Fleming M.D.   On: 10/19/2016 19:59   Dg Chest Port 1 View  Result Date: 10/20/2016 CLINICAL DATA:  Post intubation EXAM: PORTABLE CHEST 1 VIEW COMPARISON:  10/19/2016 FINDINGS: Endotracheal tube is 5 cm above the carina. Mild hyperinflation/ COPD. Linear scarring or atelectasis in the lower lungs. Heart is upper limits normal in size. Moderate-sized hiatal hernia. No effusions. IMPRESSION: Endotracheal tube 5 cm above the carina. Bibasilar scarring or atelectasis. COPD. Moderate hiatal hernia. Electronically Signed   By: Charlett Nose M.D.   On: 10/20/2016 18:31   Dg Hip Unilat W Or W/o Pelvis 2-3 Views Right  Result Date: 10/20/2016 CLINICAL DATA:  Post pop right hip hemiarthroplasty EXAM: DG HIP (WITH OR WITHOUT PELVIS) 2-3V RIGHT COMPARISON:  10/19/2016 FINDINGS: Changes of right hip replacement noted. No hardware complicating feature. Normal alignment. No acute fracture, subluxation or dislocation. IMPRESSION: Right hip replacement.  No hardware complicating feature. Electronically Signed   By: Charlett Nose M.D.   On: 10/20/2016 18:31   Dg Hip Unilat W Or Wo Pelvis 2-3 Views Right  Result Date: 10/19/2016 CLINICAL DATA:  81 year old female status post fall today at 1700 hours, found down at 1830 hours. On Plavix. Initial encounter. EXAM: DG HIP (WITH OR WITHOUT PELVIS) 2-3V RIGHT COMPARISON:  None. FINDINGS: Acute fracture of the right femoral neck appears to be mildly comminuted and there is varus impaction. The right femoral head remains normally located. The intertrochanteric segment and proximal shaft of the right femur appears to remain intact. Left femoral head normally located. Grossly intact proximal left femur.  No superimposed pelvis fracture identified. Aortoiliac calcified atherosclerosis noted. Common iliac artery vascular stents. Pelvic phleboliths. Negative visible bowel gas pattern. IMPRESSION: 1. Right femoral neck fracture appears to be mildly comminuted with varus impaction. 2. No other acute fracture about the right hip or pelvis. Right femur intertrochanteric segment appears intact. 3.  Aortoiliac calcified atherosclerosis with vascular stents. Electronically Signed   By: Odessa Fleming M.D.   On: 10/19/2016 19:54     ASSESSMENT / PLAN:   A: Righthip unipolar hemiarthroplasty Hx: COPD Hypertension P: Maintain O2 sats >88% Continue prn Albuterol nebs Oxycodone for pain Incentive Spirometry Rest per primary   Patient is extubated and doing well at this time.  PCCM will sign off now.  Will be available if any further assistance is needed.  Thank you for enabling [place] to take care of this patient.     Shaunte Weissinger,AG-ACNP Pulmonary & Critical Care 10/21/2016, 5:21 AM

## 2016-10-21 NOTE — Progress Notes (Signed)
[facility] -  at [facility]   PATIENT NAME: Donna Quinn    MR#:  [id number]  DATE OF BIRTH:  [birth year]  SUBJECTIVE:  CHIEF COMPLAINT:  Pain is Postoperatively transferred to ICU as she was intubated Today morning she was extubated and doing fine  REVIEW OF SYSTEMS:  CONSTITUTIONAL: No fever, fatigue or weakness.  EYES: No blurred or double vision.  EARS, NOSE, AND THROAT: No tinnitus or ear pain.  RESPIRATORY: No cough, shortness of breath, wheezing or hemoptysis.  CARDIOVASCULAR: No chest pain, orthopnea, edema.  GASTROINTESTINAL: No nausea, vomiting, diarrhea or abdominal pain.  GENITOURINARY: No dysuria, hematuria.  ENDOCRINE: No polyuria, nocturia,  HEMATOLOGY: No anemia, easy bruising or bleeding SKIN: No rash or lesion. MUSCULOSKELETAL: Reporting right hip pain   NEUROLOGIC: No tingling, numbness, weakness.  PSYCHIATRY: No anxiety or depression.   DRUG ALLERGIES:   Allergies  Allergen Reactions  . Aggrenox [Aspirin-Dipyridamole Er]     A really bad headache  . Augmentin [Amoxicillin-Pot Clavulanate]     unknown  . Penicillins Other (See Comments)    Has patient had a PCN reaction causing immediate rash, facial/tongue/throat swelling, SOB or lightheadedness with hypotension: Yes Has patient had a PCN reaction causing severe rash involving mucus membranes or skin necrosis: Yes Has patient had a PCN reaction that required hospitalization No Has patient had a PCN reaction occurring within the last 10 years: No If all of the above answers are "NO", then may proceed with Cephalosporin use.  . Remeron [Mirtazapine]     Broke out inside the mouth  . Sulfa Antibiotics     Broke out in welps and had to be hospitalized    VITALS:  Blood pressure (!) 118/51, pulse 90, temperature 98.7 F (37.1 C), temperature source Oral, resp. rate (!) 21, height 5\' 6"  (1.676 m), weight 70.3 kg (154 lb 15.7 oz), SpO2 95 %.  PHYSICAL EXAMINATION:   GENERAL:  81 y.o.-year-old patient lying in the bed with no acute distress.  EYES: Pupils equal, round, reactive to light and accommodation. No scleral icterus. Extraocular muscles intact.  HEENT: Patient is normocephalic. Oropharynx and nasopharynx clear.  NECK:  Supple, no jugular venous distention. No thyroid enlargement, no tenderness.  LUNGS: Normal breath sounds bilaterally, no wheezing, rales,rhonchi or crepitation. No use of accessory muscles of respiration.  CARDIOVASCULAR: S1, S2 normal. No murmurs, rubs, or gallops.  ABDOMEN: Soft, nontender, nondistended. Bowel sounds present. No organomegaly or mass.  EXTREMITIES: Right hip status post surgery clean honeycomb dressing No pedal edema, cyanosis, or clubbing.  NEUROLOGIC: Cranial nerves II through XII are intact. Muscle strength 5/5 in all extremities. Sensation intact. Gait not checked.  PSYCHIATRIC: The patient is alert and oriented x 3.  SKIN: No obvious rash, lesion, or ulcer.    LABORATORY PANEL:   CBC  Recent Labs Lab 10/21/16 0424  WBC 8.1  HGB 10.0*  HCT 29.9*  PLT 142*   ------------------------------------------------------------------------------------------------------------------  Chemistries   Recent Labs Lab 10/21/16 0424  NA 135  K 4.9  CL 108  CO2 23  GLUCOSE 220*  BUN 24*  CREATININE 1.13*  CALCIUM 8.1*   ------------------------------------------------------------------------------------------------------------------  Cardiac Enzymes No results for input(s): TROPONINI in the last 168 hours. ------------------------------------------------------------------------------------------------------------------  RADIOLOGY:  Dg Chest 1 View  Result Date: 10/19/2016 CLINICAL DATA:  81 year old female status post fall today at 1700 hours, found down at 1830 hours. Right hip fracture. On Plavix. Initial encounter. EXAM: CHEST 1 VIEW COMPARISON:  Chest CTA 08/06/2016 and  earlier. FINDINGS: Supine AP  view of the chest at 1927 hours. Stable lung volumes. The lungs are clear. No pneumothorax or pleural effusion identified. Eventration of the diaphragm. Stable cardiac size and mediastinal contours. Small to moderate gastric hiatal hernia. Calcified aortic atherosclerosis. Visualized tracheal air column is within normal limits. IMPRESSION: No acute cardiopulmonary abnormality. Moderate gastric hiatal hernia.  Calcified aortic atherosclerosis. Electronically Signed   By: Odessa Fleming M.D.   On: 10/19/2016 19:56   Ct Head Wo Contrast  Result Date: 10/19/2016 CLINICAL DATA:  81 year old female status post fall today at 1700 hours, found down at 1830 hours. Right hip fracture. On Plavix. Initial encounter. EXAM: CT HEAD WITHOUT CONTRAST TECHNIQUE: Contiguous axial images were obtained from the base of the skull through the vertex without intravenous contrast. COMPARISON:  Head CT without contrast 10/21/2014 FINDINGS: Brain: Left ICA terminus chronic embolization coil pack with streak artifact. 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 Patchy and confluent bilateral cerebral white matter hypodensity. No midline shift, mass effect, or evidence of intracranial mass lesion. No acute intracranial hemorrhage identified. No ventriculomegaly. No cortically based acute infarct identified. Vascular: Calcified atherosclerosis at the skull base. Skull: Intact.  No acute osseous abnormality identified. Sinuses/Orbits: Visualized paranasal sinuses and mastoids are stable and well pneumatized. Other: Right forehead scalp hematoma measures up to 10 mm in thickness. Underlying right frontal bone appears intact. Interval postoperative changes to both globes. Otherwise negative visible orbits soft tissues. No other scalp soft tissue injury identified. IMPRESSION: 1. Right forehead scalp soft tissue injury without underlying fracture. 2. No acute traumatic injury to the brain.  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 Electronically Signed   By: Odessa Fleming M.D.   On: 10/19/2016 19:59   Dg Chest Port 1 View  Result Date: 10/20/2016 CLINICAL DATA:  Post intubation EXAM: PORTABLE CHEST 1 VIEW COMPARISON:  10/19/2016 FINDINGS: Endotracheal tube is 5 cm above the carina. Mild hyperinflation/ COPD. Linear scarring or atelectasis in the lower lungs. Heart is upper limits normal in size. Moderate-sized hiatal hernia. No effusions. IMPRESSION: Endotracheal tube 5 cm above the carina. Bibasilar scarring or atelectasis. COPD. Moderate hiatal hernia. Electronically Signed   By: Charlett Nose M.D.   On: 10/20/2016 18:31   Dg Hip Unilat W Or W/o Pelvis 2-3 Views Right  Result Date: 10/20/2016 CLINICAL DATA:  Post pop right hip hemiarthroplasty EXAM: DG HIP (WITH OR WITHOUT PELVIS) 2-3V RIGHT COMPARISON:  10/19/2016 FINDINGS: Changes of right hip replacement noted. No hardware complicating feature. Normal alignment. No acute fracture, subluxation or dislocation. IMPRESSION: Right hip replacement.  No hardware complicating feature. Electronically Signed   By: Charlett Nose M.D.   On: 10/20/2016 18:31   Dg Hip Unilat W Or Wo Pelvis 2-3 Views Right  Result Date: 10/19/2016 CLINICAL DATA:  81 year old female status post fall today at 1700 hours, found down at 1830 hours. On Plavix. Initial encounter. EXAM: DG HIP (WITH OR WITHOUT PELVIS) 2-3V RIGHT COMPARISON:  None. FINDINGS: Acute fracture of the right femoral neck appears to be mildly comminuted and there is varus impaction. The right femoral head remains normally located. The intertrochanteric segment and proximal shaft of the right femur appears to remain intact. Left femoral head normally located. Grossly intact proximal left femur. No superimposed pelvis fracture identified. Aortoiliac calcified atherosclerosis noted. Common iliac artery vascular stents. Pelvic phleboliths. Negative  visible bowel gas pattern. IMPRESSION:  1. Right femoral neck fracture appears to be mildly comminuted with varus impaction. 2. No other acute fracture about the right hip or pelvis. Right femur intertrochanteric segment appears intact. 3.  Aortoiliac calcified atherosclerosis with vascular stents. Electronically Signed   By: Odessa FlemingH  Hall M.D.   On: 10/19/2016 19:54    EKG:   Orders placed or performed during the hospital encounter of 10/19/16  . EKG 12-Lead  . EKG 12-Lead    ASSESSMENT AND PLAN:   #Right foot pain secondary to right femoral neck fracturePostop day #1 [facility] patient has only 1 risk factor Medically optimized for surgery Pain management as needed Status post right hip arthroplasty postop day #1  -follow-up with ortho -am labs  #Abnormal urinalysis urine culture with no growth. MRSA PCR negative  HTN (hypertension) - continue home meds, blood pressure is currently stable    COPD (HCC) - not in exacerbation, continue as needed inhalers    HLD (hyperlipidemia) - continue home dose statin    GERD (gastroesophageal reflux disease) - continue home dose PPI   All the records are reviewed and case discussed with Care Management/Social Workerr. Management plans discussed with the patient, family and they are in agreement.  CODE STATUS: fc  TOTAL TIME TAKING CARE OF THIS PATIENT: 36 minutes.   POSSIBLE D/C IN 2 DAYS, DEPENDING ON CLINICAL CONDITION.  Note: This dictation was prepared with Dragon dictation along with smaller phrase technology. Any transcriptional errors that result from this process are unintentional.   Ramonita LabGouru, Sanela Evola M.D on 10/21/2016 at 3:08 PM  Between 7am to 6pm - Pager - [phone] After 6pm go to [email or web address] - password [email or web address]  [facility]  Office  [phone]  CC: Primary care physician; Marguarite ArbourSPARKS,JEFFREY D, MD

## 2016-10-21 NOTE — Progress Notes (Signed)
Pt is alert and oriented this am. Pt is tolerating room air. O2  Saturations 92%. Pt is denying pain at this time. Pt educated on incentive spirometry and provided device. Pt demonstrated understanding. Will continue to assess.

## 2016-10-22 ENCOUNTER — Encounter
Admission: RE | Admit: 2016-10-22 | Discharge: 2016-10-22 | Disposition: A | Discharge status: Home / Self Care | Payer: Medicare HMO | Source: Ambulatory Visit | Attending: Internal Medicine | Admitting: Internal Medicine

## 2016-10-22 DIAGNOSIS — I10 Essential (primary) hypertension: Secondary | ICD-10-CM | POA: Diagnosis not present

## 2016-10-22 DIAGNOSIS — D649 Anemia, unspecified: Secondary | ICD-10-CM | POA: Insufficient documentation

## 2016-10-22 DIAGNOSIS — E785 Hyperlipidemia, unspecified: Secondary | ICD-10-CM | POA: Diagnosis not present

## 2016-10-22 DIAGNOSIS — I714 Abdominal aortic aneurysm, without rupture: Secondary | ICD-10-CM | POA: Diagnosis not present

## 2016-10-22 DIAGNOSIS — J431 Panlobular emphysema: Secondary | ICD-10-CM | POA: Diagnosis not present

## 2016-10-22 DIAGNOSIS — F325 Major depressive disorder, single episode, in full remission: Secondary | ICD-10-CM | POA: Diagnosis not present

## 2016-10-22 DIAGNOSIS — K219 Gastro-esophageal reflux disease without esophagitis: Secondary | ICD-10-CM | POA: Diagnosis not present

## 2016-10-22 DIAGNOSIS — J449 Chronic obstructive pulmonary disease, unspecified: Secondary | ICD-10-CM | POA: Diagnosis not present

## 2016-10-22 DIAGNOSIS — M6281 Muscle weakness (generalized): Secondary | ICD-10-CM | POA: Diagnosis not present

## 2016-10-22 DIAGNOSIS — R262 Difficulty in walking, not elsewhere classified: Secondary | ICD-10-CM | POA: Diagnosis not present

## 2016-10-22 DIAGNOSIS — Z7401 Bed confinement status: Secondary | ICD-10-CM | POA: Diagnosis not present

## 2016-10-22 DIAGNOSIS — S72009A Fracture of unspecified part of neck of unspecified femur, initial encounter for closed fracture: Secondary | ICD-10-CM | POA: Diagnosis not present

## 2016-10-22 DIAGNOSIS — Z9181 History of falling: Secondary | ICD-10-CM | POA: Diagnosis not present

## 2016-10-22 DIAGNOSIS — G629 Polyneuropathy, unspecified: Secondary | ICD-10-CM | POA: Diagnosis not present

## 2016-10-22 DIAGNOSIS — M810 Age-related osteoporosis without current pathological fracture: Secondary | ICD-10-CM | POA: Diagnosis not present

## 2016-10-22 DIAGNOSIS — M80051D Age-related osteoporosis with current pathological fracture, right femur, subsequent encounter for fracture with routine healing: Secondary | ICD-10-CM | POA: Diagnosis not present

## 2016-10-22 DIAGNOSIS — M25551 Pain in right hip: Secondary | ICD-10-CM | POA: Diagnosis not present

## 2016-10-22 DIAGNOSIS — K5900 Constipation, unspecified: Secondary | ICD-10-CM | POA: Diagnosis not present

## 2016-10-22 DIAGNOSIS — S72001D Fracture of unspecified part of neck of right femur, subsequent encounter for closed fracture with routine healing: Secondary | ICD-10-CM | POA: Diagnosis not present

## 2016-10-22 LAB — CBC
HCT: 25.4 % — ABNORMAL LOW (ref 35.0–47.0)
Hemoglobin: 8.6 g/dL — ABNORMAL LOW (ref 12.0–16.0)
MCH: 29.4 pg (ref 26.0–34.0)
MCHC: 33.9 g/dL (ref 32.0–36.0)
MCV: 86.8 fL (ref 80.0–100.0)
PLATELETS: 143 10*3/uL — AB (ref 150–440)
RBC: 2.93 MIL/uL — AB (ref 3.80–5.20)
RDW: 15.1 % — AB (ref 11.5–14.5)
WBC: 7.2 10*3/uL (ref 3.6–11.0)

## 2016-10-22 LAB — BASIC METABOLIC PANEL
Anion gap: 4 — ABNORMAL LOW (ref 5–15)
BUN: 24 mg/dL — AB (ref 6–20)
CALCIUM: 8.1 mg/dL — AB (ref 8.9–10.3)
CO2: 25 mmol/L (ref 22–32)
CREATININE: 1.1 mg/dL — AB (ref 0.44–1.00)
Chloride: 109 mmol/L (ref 101–111)
GFR, EST AFRICAN AMERICAN: 54 mL/min — AB (ref >60)
GFR, EST NON AFRICAN AMERICAN: 47 mL/min — AB (ref >60)
Glucose, Bld: 104 mg/dL — ABNORMAL HIGH (ref 65–99)
Potassium: 4.7 mmol/L (ref 3.5–5.1)
SODIUM: 138 mmol/L (ref 135–145)

## 2016-10-22 MED ORDER — ONDANSETRON HCL 4 MG PO TABS: 4.0000 mg | ORAL_TABLET | Freq: Four times a day (QID) | ORAL | 0 refills | Status: DC | PRN

## 2016-10-22 MED ORDER — FERROUS SULFATE 325 (65 FE) MG PO TABS: 325.0000 mg | ORAL_TABLET | Freq: Every day | ORAL | 3 refills | Status: DC

## 2016-10-22 MED ORDER — DOCUSATE SODIUM 100 MG PO CAPS: 100.0000 mg | ORAL_CAPSULE | Freq: Two times a day (BID) | ORAL | 0 refills | Status: DC | PRN

## 2016-10-22 MED ORDER — ENOXAPARIN SODIUM 40 MG/0.4ML ~~LOC~~ SOLN: 40.0000 mg | SUBCUTANEOUS | 0 refills | Status: DC

## 2016-10-22 MED ORDER — ACETAMINOPHEN 500 MG PO TABS: 500.0000 mg | ORAL_TABLET | Freq: Four times a day (QID) | ORAL | 0 refills | Status: AC | PRN

## 2016-10-22 MED ORDER — OXYCODONE HCL 5 MG PO TABS: 5.0000 mg | ORAL_TABLET | ORAL | 0 refills | Status: DC | PRN

## 2016-10-22 MED ORDER — FERROUS SULFATE 325 (65 FE) MG PO TABS: 325.0000 mg | ORAL_TABLET | Freq: Every day | ORAL | Status: DC

## 2016-10-22 NOTE — Progress Notes (Signed)
[facility]. Family at bedside.

## 2016-10-22 NOTE — Progress Notes (Signed)
Physical Therapy Treatment Patient Details Name: Donna Quinn MRN: [id number] DOB: 08/23/1935 Today's Date: 10/22/2016    History of Present Illness Pt is a 81 y.o. female presenting to hospital s/p fall sustaining R femoral neck fx (mildly comminuted with varus impaction) and R forehead scalp soft tissue injury.  Pt s/p R hip unipolar hemiarthroplasty (posterior approach) 10/20/16 and transferred to CCU post-op on ventilator and extubated PM of 10/20/16.  PMH includes hiatal hernia, CVA, brain aneurysm, AAA, htn, COPD, CEA.    PT Comments    Pt able to progress to walking a few feet bed to recliner with RW but pt with posterior lean requiring vc's and physical assist to correct and maintain balance with this activity.  Pt reporting minimal pain during session.  Pt appears appropriate to upgrade pt's frequency from QD to BID.  Will continue to progress pt with strengthening, balance, and increasing ambulation distance as appropriate.    Follow Up Recommendations  SNF     Equipment Recommendations  Rolling walker with 5" wheels    Recommendations for Other Services OT consult     Precautions / Restrictions Precautions Precautions: Fall;Posterior Hip Precaution Booklet Issued: Yes (comment) Required Braces or Orthoses: Other Brace/Splint Other Brace/Splint: Hip Abduction wedge in bed Restrictions Weight Bearing Restrictions: Yes RLE Weight Bearing: Weight bearing as tolerated    Mobility  Bed Mobility Overal bed mobility: Needs Assistance Bed Mobility: Supine to Sit     Supine to sit: Mod assist;Max assist     General bed mobility comments: assist for trunk and B LE's; increased assist required d/t R hip pain with movement  Transfers Overall transfer level: Needs assistance Equipment used: Rolling walker (2 wheeled) Transfers: Sit to/from Stand Sit to Stand: Mod assist         General transfer comment: vc's for hand and feet placement; increased effort and time to  stand; posterior lean upon standinging requiring cueing and physical assist to correct  Ambulation/Gait Ambulation/Gait assistance: Mod assist Ambulation Distance (Feet): 3 Feet (bed to recliner) Assistive device: Rolling walker (2 wheeled)   Gait velocity: decreased   General Gait Details: vc's required for gait technique and walker use; decreased stance time R LE; posterior lean noted intermittently requiring vc's and assist to correct   Stairs            Wheelchair Mobility    Modified Rankin (Stroke Patients Only)       Balance Overall balance assessment: Needs assistance;History of Falls Sitting-balance support: Bilateral upper extremity supported;Feet unsupported Sitting balance-Leahy Scale: Fair Sitting balance - Comments: static sitting   Standing balance support: Bilateral upper extremity supported (on RW) Standing balance-Leahy Scale: Poor Standing balance comment: posterior lean in static standing                    Cognition Arousal/Alertness: Awake/alert Behavior During Therapy: WFL for tasks assessed/performed Overall Cognitive Status: Within Functional Limits for tasks assessed                      Exercises Total Joint Exercises Ankle Circles/Pumps: AROM;Strengthening;Both;10 reps;Supine Quad Sets: AROM;Strengthening;Both;10 reps;Supine Gluteal Sets: AROM;Strengthening;Both;10 reps;Supine Towel Squeeze: AROM;Strengthening;Both;10 reps;Supine Short Arc Quad: AROM;Strengthening;Both;10 reps;Supine Heel Slides: AAROM;Strengthening;Both;10 reps;Supine Hip ABduction/ADduction: AAROM;Strengthening;Both;10 reps;Supine  Vc's required for above exercise technique.    General Comments  Pt agreeable to PT session.      Pertinent Vitals/Pain Pain Assessment: 0-10 Pain Score: 3  Pain Location: R hip Pain Descriptors / Indicators: Sore  Pain Intervention(s): Limited activity within patient's tolerance;Monitored during session;Premedicated  before session;Repositioned  Vitals (HR and O2 on room air) stable and WFL throughout treatment session.    Home Living                      Prior Function            PT Goals (current goals can now be found in the care plan section) Acute Rehab PT Goals Patient Stated Goal: to be able to walk again PT Goal Formulation: With patient Time For Goal Achievement: 11/04/16 Potential to Achieve Goals: Good Progress towards PT goals: Progressing toward goals    Frequency    BID      PT Plan Frequency needs to be updated    Co-evaluation             End of Session Equipment Utilized During Treatment: Gait belt Activity Tolerance: Patient limited by fatigue Patient left: in chair;with call bell/phone within reach;with chair alarm set;with SCD's reapplied (B heels elevated via towel rolls; pillows placed between pt's knees (for posterior THP's)) Nurse Communication: Mobility status;Precautions;Weight bearing status PT Visit Diagnosis: History of falling (Z91.81);Difficulty in walking, not elsewhere classified (R26.2);Pain Pain - Right/Left: Right Pain - part of body: Hip     Time: 9562-1308 PT Time Calculation (min) (ACUTE ONLY): 38 min  Charges:  $Therapeutic Exercise: 23-37 mins $Therapeutic Activity: 8-22 mins                    G CodesHendricks Quinn, PT 10/22/16, 10:40 AM [phone]

## 2016-10-22 NOTE — Discharge Summary (Signed)
[facility] [place] [facility]   PATIENT NAME: Donna Quinn    MR#:  [id number]  DATE OF BIRTH:  07/03/1936  DATE OF ADMISSION:  10/19/2016 ADMITTING PHYSICIAN: Oralia Manis, MD  DATE OF DISCHARGE: 10/22/16  PRIMARY CARE PHYSICIAN: Marguarite Arbour, MD    ADMISSION DIAGNOSIS:  Fall, initial encounter [W19.XXXA] Closed fracture of right hip, initial encounter (HCC) [S72.001A]  DISCHARGE DIAGNOSIS:  Rt femoral neck fracture  SECONDARY DIAGNOSIS:   Past Medical History:  Diagnosis Date  . AAA (abdominal aortic aneurysm) (HCC)   . AAA (abdominal aortic aneurysm) (HCC)   . Abdominal aortic aneurysm (AAA) (HCC)   . Brain aneurysm   . Collagen vascular disease (HCC)   . COPD (chronic obstructive pulmonary disease) (HCC)   . Dyspnea   . GERD (gastroesophageal reflux disease)   . H/O wheezing   . Hypercholesterolemia   . Hypertension   . Neuropathy (HCC)   . Osteoporosis   . Stroke [facility]) 2016   twice    HOSPITAL COURSE:  HPI : Donna Quinn  is a 81 y.o. female who presents with Mechanical fall and subsequent right femoral neck fracture. Labs are largely within normal limits. Patient states she was at her home today and reached over to get her pocketbook and lost her balance and fell. She fell onto a hard floor. Hospitalists were called for admission with orthopedic surgery following along for surgical repair  Hospital course  #Right foot pain secondary to right femoral neck fracturePostop day #2 According to [facility] cardiac risk index patient has only 1 risk factor Medically optimized for surgery and patient had surgery Pain management as needed Status post right hip arthroplasty postop day #2 -follow-up with ortho Hemoglobin 10--8.6, clinically asymptomatic will start her on iron supplements with stool softeners Lovenox 40 mg subcutaneous once daily for the next 14 days and outpatient follow-up with ortho, okay to discharge from this  standpoint. -Patient had a bowel movement yesterday  #Abnormal urinalysis urine culture with no growth. MRSA PCR negative  HTN (hypertension) - continue home meds, blood pressure is currently stable  COPD (HCC) - not in exacerbation, continue as needed inhalers  HLD (hyperlipidemia) - continue home dose statin  GERD (gastroesophageal reflux disease) - continue home dose PPI  Discussed with the patient's daughter. Patient and daughter are agreeable with the discharge plan DISCHARGE CONDITIONS:   fair  CONSULTS OBTAINED:  Treatment Team:  Christena Flake, MD   PROCEDURES   DRUG ALLERGIES:   Allergies  Allergen Reactions  . Aggrenox [Aspirin-Dipyridamole Er]     A really bad headache  . Augmentin [Amoxicillin-Pot Clavulanate]     unknown  . Penicillins Other (See Comments)    Has patient had a PCN reaction causing immediate rash, facial/tongue/throat swelling, SOB or lightheadedness with hypotension: Yes Has patient had a PCN reaction causing severe rash involving mucus membranes or skin necrosis: Yes Has patient had a PCN reaction that required hospitalization No Has patient had a PCN reaction occurring within the last 10 years: No If all of the above answers are "NO", then may proceed with Cephalosporin use.  . Remeron [Mirtazapine]     Broke out inside the mouth  . Sulfa Antibiotics     Broke out in welps and had to be hospitalized    DISCHARGE MEDICATIONS:   Current Discharge Medication List    START taking these medications   Details  docusate sodium (COLACE) 100 MG capsule Take 1 capsule (100 mg  total) by mouth 2 (two) times daily as needed for mild constipation. Qty: 10 capsule, Refills: 0    enoxaparin (LOVENOX) 40 MG/0.4ML injection Inject 0.4 mLs (40 mg total) into the skin daily. Qty: 14 Syringe, Refills: 0    ferrous sulfate 325 (65 FE) MG tablet Take 1 tablet (325 mg total) by mouth daily with breakfast. Refills: 3    ondansetron (ZOFRAN)  4 MG tablet Take 1 tablet (4 mg total) by mouth every 6 (six) hours as needed for nausea. Qty: 20 tablet, Refills: 0    oxyCODONE (OXY IR/ROXICODONE) 5 MG immediate release tablet Take 1 tablet (5 mg total) by mouth every 4 (four) hours as needed (for pain score of 1-4). Qty: 30 tablet, Refills: 0      CONTINUE these medications which have CHANGED   Details  acetaminophen (TYLENOL) 500 MG tablet Take 1 tablet (500 mg total) by mouth every 6 (six) hours as needed for moderate pain or headache. Qty: 30 tablet, Refills: 0      CONTINUE these medications which have NOT CHANGED   Details  albuterol (PROVENTIL HFA;VENTOLIN HFA) 108 (90 Base) MCG/ACT inhaler Inhale 2 puffs into the lungs every 6 (six) hours as needed for wheezing or shortness of breath.    atorvastatin (LIPITOR) 40 MG tablet Take 40 mg by mouth every evening.     budesonide-formoterol (SYMBICORT) 160-4.5 MCG/ACT inhaler Inhale 2 puffs into the lungs 2 (two) times daily.    Cholecalciferol 2000 units CAPS Take 2,000 Units by mouth every morning.     clopidogrel (PLAVIX) 75 MG tablet Take 75 mg by mouth every morning.    diphenhydramine-acetaminophen (TYLENOL PM) 25-500 MG TABS tablet Take 2 tablets by mouth at bedtime as needed (sleep).    gabapentin (NEURONTIN) 300 MG capsule Take 300 mg by mouth 2 (two) times daily.    hydrALAZINE (APRESOLINE) 25 MG tablet Take 25 mg by mouth 2 (two) times daily.     losartan (COZAAR) 50 MG tablet Take 50 mg by mouth daily.     omeprazole (PRILOSEC) 20 MG capsule Take 20 mg by mouth 2 (two) times daily before a meal.    vitamin B-12 (CYANOCOBALAMIN) 1000 MCG tablet Take 1,000 mcg by mouth daily.    polyethylene glycol (MIRALAX / GLYCOLAX) packet Take 17 g by mouth daily as needed for mild constipation. Qty: 14 each, Refills: 0         DISCHARGE INSTRUCTIONS:   Follow-up with primary care physician at the facility in 3 days Follow-up with orthopedics Dr. Salomon MastpoGGIE in 2  weeks Continue physical therapy as recommended   DIET:  Cardiac diet  DISCHARGE CONDITION:  Stable  ACTIVITY:  Activity as tolerated per Ortho/PT  OXYGEN:  Home Oxygen: No.   Oxygen Delivery: room air  DISCHARGE LOCATION:  nursing home   If you experience worsening of your admission symptoms, develop shortness of breath, life threatening emergency, suicidal or homicidal thoughts you must seek medical attention immediately by calling 911 or calling your MD immediately  if symptoms less severe.  You Must read complete instructions/literature along with all the possible adverse reactions/side effects for all the Medicines you take and that have been prescribed to you. Take any new Medicines after you have completely understood and accpet all the possible adverse reactions/side effects.   Please note  You were cared for by a hospitalist during your hospital stay. If you have any questions about your discharge medications or the care you received while you were  in the hospital after you are discharged, you can call the unit and asked to speak with the hospitalist on call if the hospitalist that took care of you is not available. Once you are discharged, your primary care physician will handle any further medical issues. Please note that NO REFILLS for any discharge medications will be authorized once you are discharged, as it is imperative that you return to your primary care physician (or establish a relationship with a primary care physician if you do not have one) for your aftercare needs so that they can reassess your need for medications and monitor your lab values.     Today  Chief Complaint  Patient presents with  . Fall   Patient is resting comfortably. Denies any pain. No shortness of breath no other complaints. Agreeable with the discharge plan  ROS:  CONSTITUTIONAL: Denies fevers, chills. Denies any fatigue, weakness.  EYES: Denies blurry vision, double vision, eye  pain. EARS, NOSE, THROAT: Denies tinnitus, ear pain, hearing loss. RESPIRATORY: Denies cough, wheeze, shortness of breath.  CARDIOVASCULAR: Denies chest pain, palpitations, edema.  GASTROINTESTINAL: Denies nausea, vomiting, diarrhea, abdominal pain. Denies bright red blood per rectum. GENITOURINARY: Denies dysuria, hematuria. ENDOCRINE: Denies nocturia or thyroid problems. HEMATOLOGIC AND LYMPHATIC: Denies easy bruising or bleeding. SKIN: Denies rash or lesion. MUSCULOSKELETAL: Right hip area intact in the honeycomb dressing with minimal pain Denies pain in neck, back, shoulder, knees, hips or arthritic symptoms.  NEUROLOGIC: Denies paralysis, paresthesias.  PSYCHIATRIC: Denies anxiety or depressive symptoms.   VITAL SIGNS:  Blood pressure (!) 117/43, pulse 81, temperature 99.4 F (37.4 C), temperature source Oral, resp. rate 18, height 5\' 6"  (1.676 m), weight 70.3 kg (154 lb 15.7 oz), SpO2 94 %.  I/O:    Intake/Output Summary (Last 24 hours) at 10/22/16 1207 Last data filed at 10/22/16 0900  Gross per 24 hour  Intake             1210 ml  Output             1475 ml  Net             -265 ml    PHYSICAL EXAMINATION:  GENERAL:  81 y.o.-year-old patient lying in the bed with no acute distress.  EYES: Pupils equal, round, reactive to light and accommodation. No scleral icterus. Extraocular muscles intact.  HEENT: Head atraumatic, normocephalic. Oropharynx and nasopharynx clear.  NECK:  Supple, no jugular venous distention. No thyroid enlargement, no tenderness.  LUNGS: Normal breath sounds bilaterally, no wheezing, rales,rhonchi or crepitation. No use of accessory muscles of respiration.  CARDIOVASCULAR: S1, S2 normal. No murmurs, rubs, or gallops.  ABDOMEN: Soft, non-tender, non-distended. Bowel sounds present. No organomegaly or mass.  EXTREMITIES:Right hip pain is minimally tender , intact honeycomb dressing No pedal edema, cyanosis, or clubbing.  NEUROLOGIC: Cranial nerves II  through XII are intact. Muscle strength grossly intact except in the right hip area Sensation intact. Gait not checked.  PSYCHIATRIC: The patient is alert and oriented x 3.  SKIN: No obvious rash, lesion, or ulcer.   DATA REVIEW:   CBC  Recent Labs Lab 10/22/16 0357  WBC 7.2  HGB 8.6*  HCT 25.4*  PLT 143*    Chemistries   Recent Labs Lab 10/22/16 0357  NA 138  K 4.7  CL 109  CO2 25  GLUCOSE 104*  BUN 24*  CREATININE 1.10*  CALCIUM 8.1*    Cardiac Enzymes No results for input(s): TROPONINI in the last 168 hours.  Microbiology Results  Results for orders placed or performed during the hospital encounter of 10/19/16  Urine culture     Status: None   Collection Time: 10/19/16 10:22 PM  Result Value Ref Range Status   Specimen Description URINE, RANDOM  Final   Special Requests NONE  Final   Culture   Final    NO GROWTH Performed at [place] [facility], [address], [place], [place] [address]    Report Status 10/21/2016 FINAL  Final  Surgical PCR screen     Status: None   Collection Time: 10/19/16 10:46 PM  Result Value Ref Range Status   MRSA, PCR NEGATIVE NEGATIVE Final   Staphylococcus aureus NEGATIVE NEGATIVE Final    Comment:        The Xpert SA Assay (FDA approved for NASAL specimens in patients over 50 years of age), is one component of a comprehensive surveillance program.  Test performance has been validated by [facility] for patients greater than or equal to 2 year old. It is not intended to diagnose infection nor to guide or monitor treatment.     RADIOLOGY:  Dg Chest 1 View  Result Date: 10/19/2016 CLINICAL DATA:  81 year old female status post fall today at 1700 hours, found down at 1830 hours. Right hip fracture. On Plavix. Initial encounter. EXAM: CHEST 1 VIEW COMPARISON:  Chest CTA 08/06/2016 and earlier. FINDINGS: Supine AP view of the chest at 1927 hours. Stable lung volumes. The lungs are clear. No pneumothorax or pleural  effusion identified. Eventration of the diaphragm. Stable cardiac size and mediastinal contours. Small to moderate gastric hiatal hernia. Calcified aortic atherosclerosis. Visualized tracheal air column is within normal limits. IMPRESSION: No acute cardiopulmonary abnormality. Moderate gastric hiatal hernia.  Calcified aortic atherosclerosis. Electronically Signed   By: Odessa [place] M.D.   On: 10/19/2016 19:56   Ct Head Wo Contrast  Result Date: 10/19/2016 CLINICAL DATA:  81 year old female status post fall today at 1700 hours, found down at 1830 hours. Right hip fracture. On Plavix. Initial encounter. EXAM: CT HEAD WITHOUT CONTRAST TECHNIQUE: Contiguous axial images were obtained from the base of the skull through the vertex without intravenous contrast. COMPARISON:  Head CT without contrast 10/21/2014 FINDINGS: Brain: Left ICA terminus chronic embolization coil pack with streak artifact. 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 Patchy and confluent bilateral cerebral white matter hypodensity. No midline shift, mass effect, or evidence of intracranial mass lesion. No acute intracranial hemorrhage identified. No ventriculomegaly. No cortically based acute infarct identified. Vascular: Calcified atherosclerosis at the skull base. Skull: Intact.  No acute osseous abnormality identified. Sinuses/Orbits: Visualized paranasal sinuses and mastoids are stable and well pneumatized. Other: Right forehead scalp hematoma measures up to 10 mm in thickness. Underlying right frontal bone appears intact. Interval postoperative changes to both globes. Otherwise negative visible orbits soft tissues. No other scalp soft tissue injury identified. IMPRESSION: 1. Right forehead scalp soft tissue injury without underlying fracture. 2. No acute traumatic injury to the brain. 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  Electronically Signed   By: Odessa [place] M.D.   On: 10/19/2016 19:59   Dg Chest Port 1 View  Result Date: 10/20/2016 CLINICAL DATA:  Post intubation EXAM: PORTABLE CHEST 1 VIEW COMPARISON:  10/19/2016 FINDINGS: Endotracheal tube is 5 cm above the carina. Mild hyperinflation/ COPD. Linear scarring or atelectasis in the lower lungs. Heart is upper limits  normal in size. Moderate-sized hiatal hernia. No effusions. IMPRESSION: Endotracheal tube 5 cm above the carina. Bibasilar scarring or atelectasis. COPD. Moderate hiatal hernia. Electronically Signed   By: Charlett Nose M.D.   On: 10/20/2016 18:31   Dg Hip Unilat W Or W/o Pelvis 2-3 Views Right  Result Date: 10/20/2016 CLINICAL DATA:  Post pop right hip hemiarthroplasty EXAM: DG HIP (WITH OR WITHOUT PELVIS) 2-3V RIGHT COMPARISON:  10/19/2016 FINDINGS: Changes of right hip replacement noted. No hardware complicating feature. Normal alignment. No acute fracture, subluxation or dislocation. IMPRESSION: Right hip replacement.  No hardware complicating feature. Electronically Signed   By: Charlett Nose M.D.   On: 10/20/2016 18:31   Dg Hip Unilat W Or Wo Pelvis 2-3 Views Right  Result Date: 10/19/2016 CLINICAL DATA:  81 year old female status post fall today at 1700 hours, found down at 1830 hours. On Plavix. Initial encounter. EXAM: DG HIP (WITH OR WITHOUT PELVIS) 2-3V RIGHT COMPARISON:  None. FINDINGS: Acute fracture of the right femoral neck appears to be mildly comminuted and there is varus impaction. The right femoral head remains normally located. The intertrochanteric segment and proximal shaft of the right femur appears to remain intact. Left femoral head normally located. Grossly intact proximal left femur. No superimposed pelvis fracture identified. Aortoiliac calcified atherosclerosis noted. Common iliac artery vascular stents. Pelvic phleboliths. Negative visible bowel gas pattern. IMPRESSION: 1. Right femoral neck fracture appears to be mildly comminuted  with varus impaction. 2. No other acute fracture about the right hip or pelvis. Right femur intertrochanteric segment appears intact. 3.  Aortoiliac calcified atherosclerosis with vascular stents. Electronically Signed   By: Odessa [place] M.D.   On: 10/19/2016 19:54    EKG:   Orders placed or performed during the hospital encounter of 10/19/16  . EKG 12-Lead  . EKG 12-Lead      Management plans discussed with the patient, family and they are in agreement.  CODE STATUS:     Code Status Orders        Start     Ordered   10/19/16 2152  Full code  Continuous     10/19/16 2151    Code Status History    Date Active Date Inactive Code Status Order ID Comments User Context   10/19/2016  8:47 PM 10/19/2016  8:47 PM Full Code [id number]  Juanell Fairly, MD ED   08/06/2016  5:44 PM 08/08/2016  4:37 PM Full Code [id number]  Milagros Loll, MD ED      TOTAL TIME TAKING CARE OF THIS PATIENT: 45  minutes.   Note: This dictation was prepared with Dragon dictation along with smaller phrase technology. Any transcriptional errors that result from this process are unintentional.   @MEC @  on 10/22/2016 at 12:07 PM  Between 7am to 6pm - Pager - [phone]  After 6pm go to [email or web address] - password [email or web address] [facility]  [facility]  Office  [phone]  CC: Primary care physician; Marguarite Arbour, MD

## 2016-10-22 NOTE — Progress Notes (Signed)
Subjective: 2 Days Post-Op Procedure(s) (LRB): ARTHROPLASTY BIPOLAR HIP (HEMIARTHROPLASTY) (Right) Patient reports pain as mild.   Patient is well, and has had no acute complaints or problems Plan is to go Skilled nursing facility after hospital stay. Negative for chest pain and shortness of breath Fever: 99 this AM. Gastrointestinal:Negative for nausea and vomiting  Objective: Vital signs in last 24 hours: Temp:  [98.1 F (36.7 C)-99.2 F (37.3 C)] 99 F (37.2 C) (03/16 0447) Pulse Rate:  [77-99] 79 (03/16 0447) Resp:  [16-22] 18 (03/16 0447) BP: (105-166)/(44-108) 138/58 (03/16 0447) SpO2:  [92 %-96 %] 92 % (03/16 0447)  Intake/Output from previous day:  Intake/Output Summary (Last 24 hours) at 10/22/16 0745 Last data filed at 10/22/16 0700  Gross per 24 hour  Intake             1695 ml  Output             1730 ml  Net              -35 ml    Intake/Output this shift: No intake/output data recorded.  Labs:  Recent Labs  10/19/16 1913 10/20/16 0418 10/21/16 0424 10/22/16 0357  HGB 12.5 10.5* 10.0* 8.6*    Recent Labs  10/21/16 0424 10/22/16 0357  WBC 8.1 7.2  RBC 3.39* 2.93*  HCT 29.9* 25.4*  PLT 142* 143*    Recent Labs  10/21/16 0424 10/22/16 0357  NA 135 138  K 4.9 4.7  CL 108 109  CO2 23 25  BUN 24* 24*  CREATININE 1.13* 1.10*  GLUCOSE 220* 104*  CALCIUM 8.1* 8.1*    Recent Labs  10/19/16 1913  INR 0.94     EXAM General - Patient is Alert, Appropriate and Oriented Extremity - ABD soft Sensation intact distally Intact pulses distally Dorsiflexion/Plantar flexion intact Incision: dressing C/D/I No cellulitis present Dressing/Incision - clean, dry, no drainage Motor Function - intact, moving foot and toes well on exam.  Abdomen soft with normal BS.  Past Medical History:  Diagnosis Date  . AAA (abdominal aortic aneurysm) (HCC)   . AAA (abdominal aortic aneurysm) (HCC)   . Abdominal aortic aneurysm (AAA) (HCC)   . Brain  aneurysm   . Collagen vascular disease (HCC)   . COPD (chronic obstructive pulmonary disease) (HCC)   . Dyspnea   . GERD (gastroesophageal reflux disease)   . H/O wheezing   . Hypercholesterolemia   . Hypertension   . Neuropathy (HCC)   . Osteoporosis   . Stroke (HCC) 2016   twice    Assessment/Plan: 2 Days Post-Op Procedure(s) (LRB): ARTHROPLASTY BIPOLAR HIP (HEMIARTHROPLASTY) (Right) Principal Problem:   Fracture of femoral neck, right (HCC) Active Problems:   HTN (hypertension)   HLD (hyperlipidemia)   COPD with acute bronchitis (HCC)   GERD (gastroesophageal reflux disease)  Estimated body mass index is 25.01 kg/m as calculated from the following:   Height as of this encounter: 5\' 6"  (1.676 m).   Weight as of this encounter: 70.3 kg (154 lb 15.7 oz). Advance diet Up with therapy   Labs reviewed, 99 temp this AM, WBC 7.2, denies any SOB or urinary symptoms. Can remove Foley today, discuss with internal med removal of telemetry. Will begin working with PT today, PT referral placed for the patient. Up with therapy, likely will need some short SNF placement.  DVT Prophylaxis - Lovenox, Foot Pumps and TED hose Weight-Bearing as tolerated to right leg  J. Horris Latino, PA-C [facility] Orthopaedic  Surgery 10/22/2016, 7:45 AM

## 2016-10-22 NOTE — Care Management Important Message (Signed)
Important Message  Patient Details  Name: Donna Quinn MRN: [id number] Date of Birth: 07/04/1936   Medicare Important Message Given:  Yes    Marily MemosLisa M Debraann Livingstone, RN 10/22/2016, 11:45 AM

## 2016-10-22 NOTE — Clinical Social Work Placement (Signed)
   CLINICAL SOCIAL WORK PLACEMENT  NOTE  Date:  10/22/2016  Patient Details  Name: Donna Quinn MRN: [id number] Date of Birth: 10/07/1935  Clinical Social Work is seeking post-discharge placement for this patient at the Skilled  Nursing Facility level of care (*CSW will initial, date and re-position this form in  chart as items are completed):  Yes   Patient/family provided with [place] Clinical Social Work Department's list of facilities offering this level of care within the geographic area requested by the patient (or if unable, by the patient's family).  Yes   Patient/family informed of their freedom to choose among providers that offer the needed level of care, that participate in Medicare, Medicaid or managed care program needed by the patient, have an available bed and are willing to accept the patient.  Yes   Patient/family informed of [facility]'s ownership interest in [facility] and [facility], as well as of the fact that they are under no obligation to receive care at these facilities.  PASRR submitted to EDS on 10/20/16     PASRR number received on 10/20/16     Existing PASRR number confirmed on       FL2 transmitted to all facilities in geographic area requested by pt/family on 10/20/16     FL2 transmitted to all facilities within larger geographic area on       Patient informed that his/her managed care company has contracts with or will negotiate with certain facilities, including the following:        Yes   Patient/family informed of bed offers received.  Patient chooses bed at  [facility] )     Physician recommends and patient chooses bed at      Patient to be transferred to  [facility] ) on 10/22/16.  Patient to be transferred to facility [facility]  [facility]     Patient family notified on 10/22/16 of transfer.  Name of family member notified:   (Patient's daughter Donna Quinn is aware of D/C today. )     PHYSICIAN        Additional Comment:    _______________________________________________ Donna Quinn, Donna CrockerBailey M, LCSW 10/22/2016, 12:18 PM

## 2016-10-22 NOTE — [place] FL2 (Signed)
[place] MEDICAID FL2 LEVEL OF CARE SCREENING TOOL     IDENTIFICATION  Patient Name: Donna Quinn Birthdate: 03-15-1936 Sex: female Admission Date (Current Location): 10/19/2016  [place] and [place] Number:  Chiropodist and Address:  [facility], [address] [place], [place] [address]      Provider Number: [id number]  Attending Physician Name and Address:  Ramonita Lab, MD  Relative Name and Phone Number:       Current Level of Care: Hospital Recommended Level of Care: Skilled Nursing Facility Prior Approval Number:    Date Approved/Denied:   PASRR Number:  [id number])  Discharge Plan: SNF    Current Diagnoses: Patient Active Problem List   Diagnosis Date Noted  . Fracture of femoral neck, right (HCC) 10/19/2016  . HTN (hypertension) 10/19/2016  . HLD (hyperlipidemia) 10/19/2016  . COPD with acute bronchitis (HCC) 10/19/2016  . GERD (gastroesophageal reflux disease) 10/19/2016  . COPD exacerbation (HCC) 08/06/2016    Orientation RESPIRATION BLADDER Height & Weight     Self, Time, Situation, Place  Normal Continent Weight: 154 lb 15.7 oz (70.3 kg) Height:  5\' 6"  (167.6 cm)  BEHAVIORAL SYMPTOMS/MOOD NEUROLOGICAL BOWEL NUTRITION STATUS   (none)  (none) Continent Diet (NPO for surgery )  AMBULATORY STATUS COMMUNICATION OF NEEDS Skin   Extensive Assist Verbally Surgical wounds                       Personal Care Assistance Level of Assistance  Bathing, Feeding, Dressing Bathing Assistance: Limited assistance Feeding assistance: Independent Dressing Assistance: Limited assistance     Functional Limitations Info  Sight, Hearing, Speech Sight Info: Adequate Hearing Info: Adequate Speech Info: Adequate    SPECIAL CARE FACTORS FREQUENCY  PT (By licensed PT), OT (By licensed OT)     PT Frequency:  (5) OT Frequency:  (5)            Contractures      Additional Factors Info  Code Status, Allergies Code  Status Info:  (Full Code. ) Allergies Info:  (Aggrenox Aspirin-dipyridamole Er, Augmentin Amoxicillin-pot Clavulanate, Penicillins, Remeron Mirtazapine, Sulfa Antibiotics)           Current Medications (10/22/2016):  This is the current hospital active medication list Current Facility-Administered Medications  Medication Dose Route Frequency Provider Last Rate Last Dose  . acetaminophen (TYLENOL) tablet 650 mg  650 mg Oral Q6H PRN Christena Flake, MD       Or  . acetaminophen (TYLENOL) suppository 650 mg  650 mg Rectal Q6H PRN Christena Flake, MD      . albuterol (PROVENTIL) (2.5 MG/3ML) 0.083% nebulizer solution 3 mL  3 mL Inhalation Q6H PRN Oralia Manis, MD      . atorvastatin (LIPITOR) tablet 40 mg  40 mg Oral QPM Oralia Manis, MD   40 mg at 10/21/16 1712  . bisacodyl (DULCOLAX) suppository 10 mg  10 mg Rectal Daily PRN Christena Flake, MD      . dextrose 5 % and 0.9 % NaCl with KCl 20 mEq/L infusion   Intravenous Continuous Christena Flake, MD 75 mL/hr at 10/21/16 1600    . diphenhydrAMINE (BENADRYL) 12.5 MG/5ML elixir 12.5-25 mg  12.5-25 mg Oral Q4H PRN Christena Flake, MD      . diphenhydrAMINE (BENADRYL) capsule 25 mg  25 mg Oral QHS PRN Oralia Manis, MD      . docusate sodium (COLACE) capsule 100 mg  100 mg Oral  BID Christena FlakeJohn J Poggi, MD   100 mg at 10/22/16 1049  . enoxaparin (LOVENOX) injection 40 mg  40 mg Subcutaneous Q24H Christena FlakeJohn J Poggi, MD   40 mg at 10/21/16 1712  . fentaNYL (SUBLIMAZE) injection 25-50 mcg  25-50 mcg Intravenous Q5 min PRN Amy Penwarden, MD      . gabapentin (NEURONTIN) capsule 300 mg  300 mg Oral BID Oralia Manisavid Willis, MD   300 mg at 10/22/16 1049  . hydrALAZINE (APRESOLINE) tablet 25 mg  25 mg Oral BID Oralia Manisavid Willis, MD   25 mg at 10/22/16 1049  . HYDROmorphone (DILAUDID) injection 0.5-1 mg  0.5-1 mg Intravenous Q2H PRN Christena FlakeJohn J Poggi, MD      . losartan (COZAAR) tablet 50 mg  50 mg Oral Daily Oralia Manisavid Willis, MD   50 mg at 10/22/16 1049  . magnesium hydroxide (MILK OF MAGNESIA) suspension  30 mL  30 mL Oral Daily PRN Christena FlakeJohn J Poggi, MD   30 mL at 10/21/16 1712  . meperidine (DEMEROL) injection 6.25-12.5 mg  6.25-12.5 mg Intravenous Q5 min PRN Amy Penwarden, MD      . metoCLOPramide (REGLAN) tablet 5-10 mg  5-10 mg Oral Q8H PRN Christena FlakeJohn J Poggi, MD       Or  . metoCLOPramide (REGLAN) injection 5-10 mg  5-10 mg Intravenous Q8H PRN Christena FlakeJohn J Poggi, MD      . mometasone-formoterol [facility](DULERA) 200-5 MCG/ACT inhaler 2 puff  2 puff Inhalation BID Oralia Manisavid Willis, MD   2 puff at 10/22/16 0756  . ondansetron (ZOFRAN) tablet 4 mg  4 mg Oral Q6H PRN Christena FlakeJohn J Poggi, MD       Or  . ondansetron [facility](ZOFRAN) injection 4 mg  4 mg Intravenous Q6H PRN Christena FlakeJohn J Poggi, MD      . oxyCODONE (Oxy IR/ROXICODONE) immediate release tablet 5 mg  5 mg Oral Once PRN Amy Penwarden, MD       Or  . oxyCODONE (ROXICODONE) 5 MG/5ML solution 5 mg  5 mg Oral Once PRN Amy Penwarden, MD      . oxyCODONE (Oxy IR/ROXICODONE) immediate release tablet 5-10 mg  5-10 mg Oral Q3H PRN Christena FlakeJohn J Poggi, MD   5 mg at 10/22/16 0608  . pantoprazole (PROTONIX) EC tablet 40 mg  40 mg Oral Daily Oralia Manisavid Willis, MD   40 mg at 10/22/16 1049  . promethazine (PHENERGAN) injection 6.25-12.5 mg  6.25-12.5 mg Intravenous Q15 min PRN Amy Penwarden, MD      . sodium phosphate (FLEET) 7-19 GM/118ML enema 1 enema  1 enema Rectal Once PRN Christena FlakeJohn J Poggi, MD         Discharge Medications: Please see discharge summary for a list of discharge medications.  Relevant Imaging Results:  Relevant Lab Results:   Additional Information  (SSN: [id number])  Sample, Darleen CrockerBailey M, LCSW

## 2016-10-22 NOTE — Plan of Care (Signed)
Problem: Safety: Goal: Ability to remain free from injury will improve Outcome: Progressing Pt remaining free from fall this shift.  Problem: Pain Managment: Goal: General experience of comfort will improve Outcome: Progressing Pain control with oral pain medications.  Problem: Tissue Perfusion: Goal: Risk factors for ineffective tissue perfusion will decrease Outcome: Progressing Surgical dressing dry and intact.  Problem: Fluid Volume: Goal: Ability to maintain a balanced intake and output will improve Outcome: Progressing Eating and drinking without difficulty.  Problem: Bowel/Gastric: Goal: Will not experience complications related to bowel motility Outcome: Completed/Met Date Met: 10/22/16 Pt able to move her bowels this shift.

## 2016-10-22 NOTE — Progress Notes (Signed)
Patient is medically stable for D[facility] today. [facility] they will accept a 5 day LOG because Francine Gravenhumana is pending. Clinical Child psychotherapistocial Worker (CSW) Interior and spatial designerdirector approved 5 day LOG. Per Marcelino DusterMichelle patient can come today to room 203-B. RN will call report at [phone] and arrange EMS for transport. RN has agreed to call patient's daughter Synetta Failnita when EMS arrives. [facility]. CSW contacted patient's daughter Synetta Failnita and made her aware of above. CSW explained to daughter that if Francine Gravenhumana does not approve SNF then patient will have to D/C home [facility]. Daughter verbalized her understanding and is agreeable to D/C plan. Daughter is agreeable to [facility]. Patient is also agreeable to [facility]. Please reconsult if future social work needs arise. CSW signing off.   Baker Hughes IncorporatedBailey Azharia Surratt, LCSW [phone]

## 2016-10-22 NOTE — Discharge Instructions (Signed)
Follow-up with primary care physician at the facility in 3 days Follow-up with orthopedics Dr. Salomon MastpoGGIE in 2 weeks Continue physical therapy as recommended

## 2016-10-25 DIAGNOSIS — J431 Panlobular emphysema: Secondary | ICD-10-CM | POA: Diagnosis not present

## 2016-10-25 DIAGNOSIS — F325 Major depressive disorder, single episode, in full remission: Secondary | ICD-10-CM | POA: Diagnosis not present

## 2016-10-25 DIAGNOSIS — I10 Essential (primary) hypertension: Secondary | ICD-10-CM | POA: Diagnosis not present

## 2016-10-25 DIAGNOSIS — M810 Age-related osteoporosis without current pathological fracture: Secondary | ICD-10-CM | POA: Diagnosis not present

## 2016-10-25 LAB — SURGICAL PATHOLOGY

## 2016-10-26 DIAGNOSIS — I10 Essential (primary) hypertension: Secondary | ICD-10-CM | POA: Diagnosis not present

## 2016-10-26 DIAGNOSIS — D649 Anemia, unspecified: Secondary | ICD-10-CM | POA: Diagnosis not present

## 2016-10-26 LAB — COMPREHENSIVE METABOLIC PANEL
ALK PHOS: 52 U/L (ref 38–126)
ALT: 19 U/L (ref 14–54)
AST: 20 U/L (ref 15–41)
Albumin: 2.9 g/dL — ABNORMAL LOW (ref 3.5–5.0)
Anion gap: 8 (ref 5–15)
BUN: 19 mg/dL (ref 6–20)
CALCIUM: 8.5 mg/dL — AB (ref 8.9–10.3)
CHLORIDE: 102 mmol/L (ref 101–111)
CO2: 27 mmol/L (ref 22–32)
Creatinine, Ser: 1.03 mg/dL — ABNORMAL HIGH (ref 0.44–1.00)
GFR, EST AFRICAN AMERICAN: 59 mL/min — AB (ref >60)
GFR, EST NON AFRICAN AMERICAN: 51 mL/min — AB (ref >60)
Glucose, Bld: 89 mg/dL (ref 65–99)
Potassium: 4.1 mmol/L (ref 3.5–5.1)
SODIUM: 137 mmol/L (ref 135–145)
Total Bilirubin: 0.4 mg/dL (ref 0.3–1.2)
Total Protein: 5.7 g/dL — ABNORMAL LOW (ref 6.5–8.1)

## 2016-10-26 LAB — CBC WITH DIFFERENTIAL/PLATELET
Basophils Absolute: 0 10*3/uL (ref 0–0.1)
Basophils Relative: 1 %
EOS ABS: 0.2 10*3/uL (ref 0–0.7)
EOS PCT: 4 %
HCT: 29.1 % — ABNORMAL LOW (ref 35.0–47.0)
Hemoglobin: 10 g/dL — ABNORMAL LOW (ref 12.0–16.0)
LYMPHS ABS: 0.5 10*3/uL — AB (ref 1.0–3.6)
Lymphocytes Relative: 9 %
MCH: 29.4 pg (ref 26.0–34.0)
MCHC: 34.2 g/dL (ref 32.0–36.0)
MCV: 86 fL (ref 80.0–100.0)
MONOS PCT: 12 %
Monocytes Absolute: 0.7 10*3/uL (ref 0.2–0.9)
Neutro Abs: 4.3 10*3/uL (ref 1.4–6.5)
Neutrophils Relative %: 74 %
PLATELETS: 220 10*3/uL (ref 150–440)
RBC: 3.39 MIL/uL — ABNORMAL LOW (ref 3.80–5.20)
RDW: 14.6 % — ABNORMAL HIGH (ref 11.5–14.5)
WBC: 5.8 10*3/uL (ref 3.6–11.0)

## 2016-11-02 NOTE — Anesthesia Postprocedure Evaluation (Signed)
Anesthesia Post Note  Patient: Donna Quinn  Procedure(s) Performed: Procedure(s) (LRB): ARTHROPLASTY BIPOLAR HIP (HEMIARTHROPLASTY) (Right)  Patient location during evaluation: ICU Anesthesia Type: General Level of consciousness: patient remains intubated per anesthesia plan Pain management: pain level controlled Vital Signs Assessment: post-procedure vital signs reviewed and stable Respiratory status: patient remains intubated per anesthesia plan and patient on ventilator - see flowsheet for VS Cardiovascular status: stable Anesthetic complications: no Comments: Patient brought to ICU intubated as planned preop.  Patient's family aware of the plan     Last Vitals:  Vitals:   10/22/16 0802 10/22/16 1516  BP: (!) 117/43 (!) 164/65  Pulse: 81 93  Resp:    Temp: 37.4 C 37.3 C    Last Pain:  Vitals:   10/22/16 1516  TempSrc: Oral  PainSc:                  Haward Pope

## 2016-11-02 NOTE — Anesthesia Postprocedure Evaluation (Deleted)
Anesthesia Post Note  Patient: Donna Quinn  Procedure(s) Performed: Procedure(s) (LRB): ARTHROPLASTY BIPOLAR HIP (HEMIARTHROPLASTY) (Right)  Patient location during evaluation: PACU Anesthesia Type: General Level of consciousness: awake and alert and oriented Pain management: pain level controlled Vital Signs Assessment: post-procedure vital signs reviewed and stable Respiratory status: spontaneous breathing Cardiovascular status: blood pressure returned to baseline Anesthetic complications: no     Last Vitals:  Vitals:   10/22/16 0802 10/22/16 1516  BP: (!) 117/43 (!) 164/65  Pulse: 81 93  Resp:    Temp: 37.4 C 37.3 C    Last Pain:  Vitals:   10/22/16 1516  TempSrc: Oral  PainSc:                  Collyns Mcquigg

## 2016-11-02 NOTE — Addendum Note (Signed)
Addendum  created 11/02/16 1813 by Yves DillPaul Lowen Mansouri, MD   Delete clinical note, Sign clinical note

## 2016-11-03 ENCOUNTER — Non-Acute Institutional Stay (SKILLED_NURSING_FACILITY): Payer: Medicare HMO | Admitting: Gerontology

## 2016-11-03 DIAGNOSIS — K5900 Constipation, unspecified: Secondary | ICD-10-CM | POA: Diagnosis not present

## 2016-11-03 DIAGNOSIS — S72001D Fracture of unspecified part of neck of right femur, subsequent encounter for closed fracture with routine healing: Secondary | ICD-10-CM

## 2016-11-07 ENCOUNTER — Encounter
Admission: RE | Admit: 2016-11-07 | Discharge: 2016-11-07 | Disposition: A | Discharge status: Home / Self Care | Payer: Medicare HMO | Source: Ambulatory Visit | Attending: Internal Medicine | Admitting: Internal Medicine

## 2016-11-08 DIAGNOSIS — M1611 Unilateral primary osteoarthritis, right hip: Secondary | ICD-10-CM | POA: Diagnosis not present

## 2016-11-08 DIAGNOSIS — Z96649 Presence of unspecified artificial hip joint: Secondary | ICD-10-CM | POA: Diagnosis not present

## 2016-11-09 DIAGNOSIS — I714 Abdominal aortic aneurysm, without rupture: Secondary | ICD-10-CM | POA: Diagnosis not present

## 2016-11-09 DIAGNOSIS — G629 Polyneuropathy, unspecified: Secondary | ICD-10-CM | POA: Diagnosis not present

## 2016-11-09 DIAGNOSIS — Z9181 History of falling: Secondary | ICD-10-CM | POA: Diagnosis not present

## 2016-11-09 DIAGNOSIS — Z96641 Presence of right artificial hip joint: Secondary | ICD-10-CM | POA: Diagnosis not present

## 2016-11-09 DIAGNOSIS — J431 Panlobular emphysema: Secondary | ICD-10-CM | POA: Diagnosis not present

## 2016-11-09 DIAGNOSIS — I739 Peripheral vascular disease, unspecified: Secondary | ICD-10-CM | POA: Diagnosis not present

## 2016-11-09 DIAGNOSIS — F325 Major depressive disorder, single episode, in full remission: Secondary | ICD-10-CM | POA: Diagnosis not present

## 2016-11-09 DIAGNOSIS — M80051D Age-related osteoporosis with current pathological fracture, right femur, subsequent encounter for fracture with routine healing: Secondary | ICD-10-CM | POA: Diagnosis not present

## 2016-11-09 DIAGNOSIS — I10 Essential (primary) hypertension: Secondary | ICD-10-CM | POA: Diagnosis not present

## 2016-11-09 NOTE — Progress Notes (Signed)
Location:      Place of Service:  SNF (31) Provider:  Lorenso Quarry, NP-C  SPARKS,JEFFREY D, MD  Patient Care Team: Marguarite Arbour, MD as PCP - General (Internal Medicine)  Extended Emergency Contact Information Primary Emergency Contact: Jorene Minors [place] Home Phone: [phone] Work Phone: [phone] Relation: Daughter Secondary Emergency Contact: Martie Lee [place] Home Phone: [phone] Work Phone: [phone] Relation: Daughter  Code Status:  full Goals of care: Advanced Directive information Advanced Directives 10/20/2016  Does Patient Have a Medical Advance Directive? No  Would patient like information on creating a medical advance directive? No - Patient declined     Chief Complaint  Patient presents with  . Follow-up    HPI:  Pt is a 81 y.o. female seen today for follow visit s/p admission to rehab from [facility] for right femur fracture with surgical intervention. Pt has been participating in PT/OT. Doing well. Reports her pain is well controlled on current regimen- "just sore at times." Reports appetite is fair, sleeping well. Pt denies n/v/d/f/c/cp/sob/ha/abd pain/ha/dizziness. Does c/o constipation. No BM in several days. On opioids. VSS. No other complaints.      Past Medical History:  Diagnosis Date  . AAA (abdominal aortic aneurysm) (HCC)   . AAA (abdominal aortic aneurysm) (HCC)   . Abdominal aortic aneurysm (AAA) (HCC)   . Brain aneurysm   . Collagen vascular disease (HCC)   . COPD (chronic obstructive pulmonary disease) (HCC)   . Dyspnea   . GERD (gastroesophageal reflux disease)   . H/O wheezing   . Hypercholesterolemia   . Hypertension   . Neuropathy (HCC)   . Osteoporosis   . Stroke [facility] [place]) 2016   twice   Past Surgical History:  Procedure Laterality Date  . abdominal Bilateral    stents  . BACK SURGERY     cyst removed from back  . BRAIN SURGERY     Brain aneurysm repair, coiling  .  CAROTID ENDARTERECTOMY    . CATARACT EXTRACTION W/PHACO Left 07/08/2016   Procedure: CATARACT EXTRACTION PHACO AND INTRAOCULAR LENS PLACEMENT (IOC);  Surgeon: Nevada Crane, MD;  Location: [facility] ORS;  Service: Ophthalmology;  Laterality: Left;  [place]  01:30 AP% 39.1 CDE 20.3 Fluid pack lot # [id number]  . CATARACT EXTRACTION W/PHACO Right 09/16/2016   Procedure: CATARACT EXTRACTION PHACO AND INTRAOCULAR LENS PLACEMENT (IOC);  Surgeon: Nevada Crane, MD;  Location: [facility] ORS;  Service: Ophthalmology;  Laterality: Right;  Lot # [id number] H [place]: 01"21.7 AP% 16.8: CDE: 13.90  . HIP ARTHROPLASTY Right 10/20/2016   Procedure: ARTHROPLASTY BIPOLAR HIP (HEMIARTHROPLASTY);  Surgeon: Christena Flake, MD;  Location: [facility] ORS;  Service: Orthopedics;  Laterality: Right;    Allergies  Allergen Reactions  . Aggrenox [Aspirin-Dipyridamole Er]     A really bad headache  . Augmentin [Amoxicillin-Pot Clavulanate]     unknown  . Penicillins Other (See Comments)    Has patient had a PCN reaction causing immediate rash, facial/tongue/throat swelling, SOB or lightheadedness with hypotension: Yes Has patient had a PCN reaction causing severe rash involving mucus membranes or skin necrosis: Yes Has patient had a PCN reaction that required hospitalization No Has patient had a PCN reaction occurring within the last 10 years: No If all of the above answers are "NO", then may proceed with Cephalosporin use.  . Remeron [Mirtazapine]     Broke out inside the mouth  . Sulfa Antibiotics     Broke out in welps and had  to be hospitalized    Allergies as of 11/03/2016      Reactions   Aggrenox [aspirin-dipyridamole Er]    A really bad headache   Augmentin [amoxicillin-pot Clavulanate]    unknown   Penicillins Other (See Comments)   Has patient had a PCN reaction causing immediate rash, facial/tongue/throat swelling, SOB or lightheadedness with hypotension: Yes Has patient had a PCN reaction causing severe rash involving  mucus membranes or skin necrosis: Yes Has patient had a PCN reaction that required hospitalization No Has patient had a PCN reaction occurring within the last 10 years: No If all of the above answers are "NO", then may proceed with Cephalosporin use.   Remeron [mirtazapine]    Broke out inside the mouth   Sulfa Antibiotics    Broke out in welps and had to be hospitalized      Medication List       Accurate as of 11/03/16 11:59 PM. Always use your most recent med list.          acetaminophen 500 MG tablet Commonly known as:  TYLENOL Take 1 tablet (500 mg total) by mouth every 6 (six) hours as needed for moderate pain or headache.   albuterol 108 (90 Base) MCG/ACT inhaler Commonly known as:  PROVENTIL HFA;VENTOLIN HFA Inhale 2 puffs into the lungs every 6 (six) hours as needed for wheezing or shortness of breath.   atorvastatin 40 MG tablet Commonly known as:  LIPITOR Take 40 mg by mouth every evening.   budesonide-formoterol 160-4.5 MCG/ACT inhaler Commonly known as:  SYMBICORT Inhale 2 puffs into the lungs 2 (two) times daily.   Cholecalciferol 2000 units Caps Take 2,000 Units by mouth every morning.   clopidogrel 75 MG tablet Commonly known as:  PLAVIX Take 75 mg by mouth every morning.   diphenhydramine-acetaminophen 25-500 MG Tabs tablet Commonly known as:  TYLENOL PM Take 2 tablets by mouth at bedtime as needed (sleep).   docusate sodium 100 MG capsule Commonly known as:  COLACE Take 1 capsule (100 mg total) by mouth 2 (two) times daily as needed for mild constipation.   enoxaparin 40 MG/0.4ML injection Commonly known as:  LOVENOX Inject 0.4 mLs (40 mg total) into the skin daily.   ferrous sulfate 325 (65 FE) MG tablet Take 1 tablet (325 mg total) by mouth daily with breakfast.   gabapentin 300 MG capsule Commonly known as:  NEURONTIN Take 300 mg by mouth 2 (two) times daily.   hydrALAZINE 25 MG tablet Commonly known as:  APRESOLINE Take 25 mg by mouth  2 (two) times daily.   losartan 50 MG tablet Commonly known as:  COZAAR Take 50 mg by mouth daily.   omeprazole 20 MG capsule Commonly known as:  PRILOSEC Take 20 mg by mouth 2 (two) times daily before a meal.   ondansetron 4 MG tablet Commonly known as:  ZOFRAN Take 1 tablet (4 mg total) by mouth every 6 (six) hours as needed for nausea.   oxyCODONE 5 MG immediate release tablet Commonly known as:  Oxy IR/ROXICODONE Take 1 tablet (5 mg total) by mouth every 4 (four) hours as needed (for pain score of 1-4).   polyethylene glycol packet Commonly known as:  MIRALAX / GLYCOLAX Take 17 g by mouth daily as needed for mild constipation.   vitamin B-12 1000 MCG tablet Commonly known as:  CYANOCOBALAMIN Take 1,000 mcg by mouth daily.       Review of Systems  Constitutional: Negative for activity change,  appetite change, chills, diaphoresis and fever.  HENT: Negative for congestion, sneezing, sore throat, trouble swallowing and voice change.   Respiratory: Negative for apnea, cough, choking, chest tightness, shortness of breath and wheezing.   Cardiovascular: Negative for chest pain, palpitations and leg swelling.  Gastrointestinal: Positive for constipation. Negative for abdominal distention, abdominal pain, diarrhea and nausea.  Genitourinary: Negative for difficulty urinating, dysuria, frequency and urgency.  Musculoskeletal: Positive for arthralgias (typical arthritis). Negative for back pain, gait problem and myalgias.  Skin: Negative for color change, pallor, rash and wound.  Neurological: Negative for dizziness, tremors, syncope, speech difficulty, weakness, numbness and headaches.  Psychiatric/Behavioral: Negative for agitation and behavioral problems.  All other systems reviewed and are negative.    There is no immunization history on file for this patient. Pertinent  Health Maintenance Due  Topic Date Due  . DEXA SCAN  04/16/2003  . PNA vac Low Risk Adult (1 of 2 -  PCV13) 04/16/2003  . INFLUENZA VACCINE  03/09/2017   No flowsheet data found. Functional Status Survey:    Vitals:   11/03/16 0500  BP: (!) 127/44  Pulse: 78  Resp: 20  Temp: 97.7 F (36.5 C)  SpO2: 98%   There is no height or weight on file to calculate BMI. Physical Exam  Constitutional: She is oriented to person, place, and time. Vital signs are normal. She appears well-developed and well-nourished. She is active and cooperative. She does not appear ill. No distress.  HENT:  Head: Normocephalic and atraumatic.  Mouth/Throat: Uvula is midline, oropharynx is clear and moist and mucous membranes are normal. Mucous membranes are not pale, not dry and not cyanotic.  Eyes: Conjunctivae, EOM and lids are normal. Pupils are equal, round, and reactive to light.  Neck: Trachea normal, normal range of motion and full passive range of motion without pain. Neck supple. No JVD present. No tracheal deviation, no edema and no erythema present. No thyromegaly present.  Cardiovascular: Normal rate, regular rhythm, normal heart sounds, intact distal pulses and normal pulses.  Exam reveals no gallop, no distant heart sounds and no friction rub.   No murmur heard. Pulmonary/Chest: Effort normal and breath sounds normal. No accessory muscle usage. No respiratory distress. She has no wheezes. She has no rales. She exhibits no tenderness.  Abdominal: Normal appearance and bowel sounds are normal. She exhibits no distension and no ascites. There is no tenderness.  Musculoskeletal: She exhibits no edema.       Right hip: She exhibits decreased range of motion, decreased strength and tenderness.  Expected osteoarthritis, stiffness; calves soft, supple. Negative Homan's sign  Neurological: She is alert and oriented to person, place, and time. She has normal strength.  Skin: Skin is warm and dry. Laceration (right hip incision) noted. No rash noted. She is not diaphoretic. No cyanosis or erythema. No pallor.  Nails show no clubbing.  Psychiatric: She has a normal mood and affect. Her speech is normal and behavior is normal. Judgment and thought content normal. Cognition and memory are normal.  Nursing note and vitals reviewed.   Labs reviewed:  Recent Labs  10/21/16 0424 10/22/16 0357 10/26/16 0728  NA 135 138 137  K 4.9 4.7 4.1  CL 108 109 102  CO2 GLUCOSE 220* 104* 89  BUN 24* 24* 19  CREATININE 1.13* 1.10* 1.03*  CALCIUM 8.1* 8.1* 8.5*    Recent Labs  08/06/16 1348 10/26/16 0728  AST 28 20  ALT 19 19  ALKPHOS  58 52  BILITOT 0.5 0.4  PROT 7.3 5.7*  ALBUMIN 4.1 2.9*    Recent Labs  10/19/16 1913  10/21/16 0424 10/22/16 0357 10/26/16 0728  WBC 9.2  < > 8.1 7.2 5.8  NEUTROABS 7.7*  --  7.6*  --  4.3  HGB 12.5  < > 10.0* 8.6* 10.0*  HCT 37.4  < > 29.9* 25.4* 29.1*  MCV 87.3  < > 88.3 86.8 86.0  PLT 201  < > 142* 143* 220  < > = values in this interval not displayed. No results found for: TSH No results found for: HGBA1C Lab Results  Component Value Date   TRIG 82 10/20/2016    Significant Diagnostic Results in last 30 days:  Dg Chest 1 View  Result Date: 10/19/2016 CLINICAL DATA:  81 year old female status post fall today at 1700 hours, found down at 1830 hours. Right hip fracture. On Plavix. Initial encounter. EXAM: CHEST 1 VIEW COMPARISON:  Chest CTA 08/06/2016 and earlier. FINDINGS: Supine AP view of the chest at 1927 hours. Stable lung volumes. The lungs are clear. No pneumothorax or pleural effusion identified. Eventration of the diaphragm. Stable cardiac size and mediastinal contours. Small to moderate gastric hiatal hernia. Calcified aortic atherosclerosis. Visualized tracheal air column is within normal limits. IMPRESSION: No acute cardiopulmonary abnormality. Moderate gastric hiatal hernia.  Calcified aortic atherosclerosis. Electronically Signed   By: Odessa Fleming M.D.   On: 10/19/2016 19:56   Ct Head Wo Contrast  Result Date: 10/19/2016 CLINICAL  DATA:  81 year old female status post fall today at 1700 hours, found down at 1830 hours. Right hip fracture. On Plavix. Initial encounter. EXAM: CT HEAD WITHOUT CONTRAST TECHNIQUE: Contiguous axial images were obtained from the base of the skull through the vertex without intravenous contrast. COMPARISON:  Head CT without contrast 10/21/2014 FINDINGS: Brain: Left ICA terminus chronic embolization coil pack with streak artifact. 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 Patchy and confluent bilateral cerebral white matter hypodensity. No midline shift, mass effect, or evidence of intracranial mass lesion. No acute intracranial hemorrhage identified. No ventriculomegaly. No cortically based acute infarct identified. Vascular: Calcified atherosclerosis at the skull base. Skull: Intact.  No acute osseous abnormality identified. Sinuses/Orbits: Visualized paranasal sinuses and mastoids are stable and well pneumatized. Other: Right forehead scalp hematoma measures up to 10 mm in thickness. Underlying right frontal bone appears intact. Interval postoperative changes to both globes. Otherwise negative visible orbits soft tissues. No other scalp soft tissue injury identified. IMPRESSION: 1. Right forehead scalp soft tissue injury without underlying fracture. 2. No acute traumatic injury to the brain. 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 Electronically Signed   By: Odessa Fleming M.D.   On: 10/19/2016 19:59   Dg Chest Port 1 View  Result Date: 10/20/2016 CLINICAL DATA:  Post intubation EXAM: PORTABLE CHEST 1 VIEW COMPARISON:  10/19/2016 FINDINGS: Endotracheal tube is 5 cm above the carina. Mild hyperinflation/ COPD. Linear scarring or atelectasis in the lower lungs. Heart is upper limits normal in size. Moderate-sized hiatal hernia. No effusions. IMPRESSION: Endotracheal tube 5 cm above the carina.  Bibasilar scarring or atelectasis. COPD. Moderate hiatal hernia. Electronically Signed   By: Charlett Nose M.D.   On: 10/20/2016 18:31   Dg Hip Unilat W Or W/o Pelvis 2-3 Views Right  Result Date: 10/20/2016 CLINICAL DATA:  Post pop right hip hemiarthroplasty EXAM: DG HIP (WITH OR WITHOUT  PELVIS) 2-3V RIGHT COMPARISON:  10/19/2016 FINDINGS: Changes of right hip replacement noted. No hardware complicating feature. Normal alignment. No acute fracture, subluxation or dislocation. IMPRESSION: Right hip replacement.  No hardware complicating feature. Electronically Signed   By: Charlett Nose M.D.   On: 10/20/2016 18:31   Dg Hip Unilat W Or Wo Pelvis 2-3 Views Right  Result Date: 10/19/2016 CLINICAL DATA:  81 year old female status post fall today at 1700 hours, found down at 1830 hours. On Plavix. Initial encounter. EXAM: DG HIP (WITH OR WITHOUT PELVIS) 2-3V RIGHT COMPARISON:  None. FINDINGS: Acute fracture of the right femoral neck appears to be mildly comminuted and there is varus impaction. The right femoral head remains normally located. The intertrochanteric segment and proximal shaft of the right femur appears to remain intact. Left femoral head normally located. Grossly intact proximal left femur. No superimposed pelvis fracture identified. Aortoiliac calcified atherosclerosis noted. Common iliac artery vascular stents. Pelvic phleboliths. Negative visible bowel gas pattern. IMPRESSION: 1. Right femoral neck fracture appears to be mildly comminuted with varus impaction. 2. No other acute fracture about the right hip or pelvis. Right femur intertrochanteric segment appears intact. 3.  Aortoiliac calcified atherosclerosis with vascular stents. Electronically Signed   By: Odessa Fleming M.D.   On: 10/19/2016 19:54    Assessment/Plan 1. Closed fracture of neck of right femur with routine healing, subsequent encounter  Continue PT/OT  Continue Lovenox 40 mg SQ Q Day x 2 more days for DVT prophylaxis  Tylenol  500 mg po Q 6 hours prn pain  Oxycodone 5 mg po Q 4 hours prn pain  Ice for edema, pain  2. Constipation, unspecified constipation type  Senna S 2 tablets po BID, then  OK to decrease to 1 tablet po BID once pt has BM   Family/ staff Communication:   Total Time:  Documentation:  Face to Face:  Family/Phone:   Labs/tests ordered:    Medication list reviewed and assessed for continued appropriateness. Monthly medication orders reviewed and signed.  Brynda Rim, NP-C Geriatrics [facility] [phone], [place] [address] Cell Phone (Mon-Fri 8am-5pm):  [phone] On Call:  [phone] & follow prompts after 5pm & weekends Office Phone:  [phone] Office Fax:  [phone]

## 2016-11-11 DIAGNOSIS — M80051D Age-related osteoporosis with current pathological fracture, right femur, subsequent encounter for fracture with routine healing: Secondary | ICD-10-CM | POA: Diagnosis not present

## 2016-11-11 DIAGNOSIS — J431 Panlobular emphysema: Secondary | ICD-10-CM | POA: Diagnosis not present

## 2016-11-11 DIAGNOSIS — I10 Essential (primary) hypertension: Secondary | ICD-10-CM | POA: Diagnosis not present

## 2016-11-11 DIAGNOSIS — F325 Major depressive disorder, single episode, in full remission: Secondary | ICD-10-CM | POA: Diagnosis not present

## 2016-11-11 DIAGNOSIS — Z9181 History of falling: Secondary | ICD-10-CM | POA: Diagnosis not present

## 2016-11-11 DIAGNOSIS — Z96641 Presence of right artificial hip joint: Secondary | ICD-10-CM | POA: Diagnosis not present

## 2016-11-11 DIAGNOSIS — I714 Abdominal aortic aneurysm, without rupture: Secondary | ICD-10-CM | POA: Diagnosis not present

## 2016-11-11 DIAGNOSIS — G629 Polyneuropathy, unspecified: Secondary | ICD-10-CM | POA: Diagnosis not present

## 2016-11-11 DIAGNOSIS — I739 Peripheral vascular disease, unspecified: Secondary | ICD-10-CM | POA: Diagnosis not present

## 2016-11-15 DIAGNOSIS — Z96641 Presence of right artificial hip joint: Secondary | ICD-10-CM | POA: Diagnosis not present

## 2016-11-15 DIAGNOSIS — Z9181 History of falling: Secondary | ICD-10-CM | POA: Diagnosis not present

## 2016-11-15 DIAGNOSIS — I714 Abdominal aortic aneurysm, without rupture: Secondary | ICD-10-CM | POA: Diagnosis not present

## 2016-11-15 DIAGNOSIS — J431 Panlobular emphysema: Secondary | ICD-10-CM | POA: Diagnosis not present

## 2016-11-15 DIAGNOSIS — I10 Essential (primary) hypertension: Secondary | ICD-10-CM | POA: Diagnosis not present

## 2016-11-15 DIAGNOSIS — I739 Peripheral vascular disease, unspecified: Secondary | ICD-10-CM | POA: Diagnosis not present

## 2016-11-15 DIAGNOSIS — F325 Major depressive disorder, single episode, in full remission: Secondary | ICD-10-CM | POA: Diagnosis not present

## 2016-11-15 DIAGNOSIS — M80051D Age-related osteoporosis with current pathological fracture, right femur, subsequent encounter for fracture with routine healing: Secondary | ICD-10-CM | POA: Diagnosis not present

## 2016-11-15 DIAGNOSIS — G629 Polyneuropathy, unspecified: Secondary | ICD-10-CM | POA: Diagnosis not present

## 2016-11-16 DIAGNOSIS — I714 Abdominal aortic aneurysm, without rupture: Secondary | ICD-10-CM | POA: Diagnosis not present

## 2016-11-16 DIAGNOSIS — I10 Essential (primary) hypertension: Secondary | ICD-10-CM | POA: Diagnosis not present

## 2016-11-16 DIAGNOSIS — M80051D Age-related osteoporosis with current pathological fracture, right femur, subsequent encounter for fracture with routine healing: Secondary | ICD-10-CM | POA: Diagnosis not present

## 2016-11-16 DIAGNOSIS — J431 Panlobular emphysema: Secondary | ICD-10-CM | POA: Diagnosis not present

## 2016-11-16 DIAGNOSIS — I739 Peripheral vascular disease, unspecified: Secondary | ICD-10-CM | POA: Diagnosis not present

## 2016-11-16 DIAGNOSIS — G629 Polyneuropathy, unspecified: Secondary | ICD-10-CM | POA: Diagnosis not present

## 2016-11-16 DIAGNOSIS — Z96641 Presence of right artificial hip joint: Secondary | ICD-10-CM | POA: Diagnosis not present

## 2016-11-16 DIAGNOSIS — F325 Major depressive disorder, single episode, in full remission: Secondary | ICD-10-CM | POA: Diagnosis not present

## 2016-11-16 DIAGNOSIS — Z9181 History of falling: Secondary | ICD-10-CM | POA: Diagnosis not present

## 2016-11-17 DIAGNOSIS — Z96641 Presence of right artificial hip joint: Secondary | ICD-10-CM | POA: Diagnosis not present

## 2016-11-17 DIAGNOSIS — J431 Panlobular emphysema: Secondary | ICD-10-CM | POA: Diagnosis not present

## 2016-11-17 DIAGNOSIS — G629 Polyneuropathy, unspecified: Secondary | ICD-10-CM | POA: Diagnosis not present

## 2016-11-17 DIAGNOSIS — I739 Peripheral vascular disease, unspecified: Secondary | ICD-10-CM | POA: Diagnosis not present

## 2016-11-17 DIAGNOSIS — I10 Essential (primary) hypertension: Secondary | ICD-10-CM | POA: Diagnosis not present

## 2016-11-17 DIAGNOSIS — F325 Major depressive disorder, single episode, in full remission: Secondary | ICD-10-CM | POA: Diagnosis not present

## 2016-11-17 DIAGNOSIS — I714 Abdominal aortic aneurysm, without rupture: Secondary | ICD-10-CM | POA: Diagnosis not present

## 2016-11-17 DIAGNOSIS — M80051D Age-related osteoporosis with current pathological fracture, right femur, subsequent encounter for fracture with routine healing: Secondary | ICD-10-CM | POA: Diagnosis not present

## 2016-11-17 DIAGNOSIS — Z9181 History of falling: Secondary | ICD-10-CM | POA: Diagnosis not present

## 2016-11-18 DIAGNOSIS — I714 Abdominal aortic aneurysm, without rupture: Secondary | ICD-10-CM | POA: Diagnosis not present

## 2016-11-18 DIAGNOSIS — M80051D Age-related osteoporosis with current pathological fracture, right femur, subsequent encounter for fracture with routine healing: Secondary | ICD-10-CM | POA: Diagnosis not present

## 2016-11-18 DIAGNOSIS — F325 Major depressive disorder, single episode, in full remission: Secondary | ICD-10-CM | POA: Diagnosis not present

## 2016-11-18 DIAGNOSIS — I739 Peripheral vascular disease, unspecified: Secondary | ICD-10-CM | POA: Diagnosis not present

## 2016-11-18 DIAGNOSIS — G629 Polyneuropathy, unspecified: Secondary | ICD-10-CM | POA: Diagnosis not present

## 2016-11-18 DIAGNOSIS — Z9181 History of falling: Secondary | ICD-10-CM | POA: Diagnosis not present

## 2016-11-18 DIAGNOSIS — I10 Essential (primary) hypertension: Secondary | ICD-10-CM | POA: Diagnosis not present

## 2016-11-18 DIAGNOSIS — Z96641 Presence of right artificial hip joint: Secondary | ICD-10-CM | POA: Diagnosis not present

## 2016-11-18 DIAGNOSIS — J431 Panlobular emphysema: Secondary | ICD-10-CM | POA: Diagnosis not present

## 2016-11-19 DIAGNOSIS — I714 Abdominal aortic aneurysm, without rupture: Secondary | ICD-10-CM | POA: Diagnosis not present

## 2016-11-19 DIAGNOSIS — G629 Polyneuropathy, unspecified: Secondary | ICD-10-CM | POA: Diagnosis not present

## 2016-11-19 DIAGNOSIS — Z96641 Presence of right artificial hip joint: Secondary | ICD-10-CM | POA: Diagnosis not present

## 2016-11-19 DIAGNOSIS — I739 Peripheral vascular disease, unspecified: Secondary | ICD-10-CM | POA: Diagnosis not present

## 2016-11-19 DIAGNOSIS — I10 Essential (primary) hypertension: Secondary | ICD-10-CM | POA: Diagnosis not present

## 2016-11-19 DIAGNOSIS — F325 Major depressive disorder, single episode, in full remission: Secondary | ICD-10-CM | POA: Diagnosis not present

## 2016-11-19 DIAGNOSIS — M80051D Age-related osteoporosis with current pathological fracture, right femur, subsequent encounter for fracture with routine healing: Secondary | ICD-10-CM | POA: Diagnosis not present

## 2016-11-19 DIAGNOSIS — J431 Panlobular emphysema: Secondary | ICD-10-CM | POA: Diagnosis not present

## 2016-11-19 DIAGNOSIS — Z9181 History of falling: Secondary | ICD-10-CM | POA: Diagnosis not present

## 2016-11-22 DIAGNOSIS — I739 Peripheral vascular disease, unspecified: Secondary | ICD-10-CM | POA: Diagnosis not present

## 2016-11-22 DIAGNOSIS — I10 Essential (primary) hypertension: Secondary | ICD-10-CM | POA: Diagnosis not present

## 2016-11-22 DIAGNOSIS — I714 Abdominal aortic aneurysm, without rupture: Secondary | ICD-10-CM | POA: Diagnosis not present

## 2016-11-22 DIAGNOSIS — G629 Polyneuropathy, unspecified: Secondary | ICD-10-CM | POA: Diagnosis not present

## 2016-11-22 DIAGNOSIS — Z96641 Presence of right artificial hip joint: Secondary | ICD-10-CM | POA: Diagnosis not present

## 2016-11-22 DIAGNOSIS — Z9181 History of falling: Secondary | ICD-10-CM | POA: Diagnosis not present

## 2016-11-22 DIAGNOSIS — J431 Panlobular emphysema: Secondary | ICD-10-CM | POA: Diagnosis not present

## 2016-11-22 DIAGNOSIS — F325 Major depressive disorder, single episode, in full remission: Secondary | ICD-10-CM | POA: Diagnosis not present

## 2016-11-22 DIAGNOSIS — M80051D Age-related osteoporosis with current pathological fracture, right femur, subsequent encounter for fracture with routine healing: Secondary | ICD-10-CM | POA: Diagnosis not present

## 2016-11-23 DIAGNOSIS — I10 Essential (primary) hypertension: Secondary | ICD-10-CM | POA: Diagnosis not present

## 2016-11-23 DIAGNOSIS — Z9181 History of falling: Secondary | ICD-10-CM | POA: Diagnosis not present

## 2016-11-23 DIAGNOSIS — I739 Peripheral vascular disease, unspecified: Secondary | ICD-10-CM | POA: Diagnosis not present

## 2016-11-23 DIAGNOSIS — F325 Major depressive disorder, single episode, in full remission: Secondary | ICD-10-CM | POA: Diagnosis not present

## 2016-11-23 DIAGNOSIS — M80051D Age-related osteoporosis with current pathological fracture, right femur, subsequent encounter for fracture with routine healing: Secondary | ICD-10-CM | POA: Diagnosis not present

## 2016-11-23 DIAGNOSIS — Z96641 Presence of right artificial hip joint: Secondary | ICD-10-CM | POA: Diagnosis not present

## 2016-11-23 DIAGNOSIS — G629 Polyneuropathy, unspecified: Secondary | ICD-10-CM | POA: Diagnosis not present

## 2016-11-23 DIAGNOSIS — J431 Panlobular emphysema: Secondary | ICD-10-CM | POA: Diagnosis not present

## 2016-11-23 DIAGNOSIS — I714 Abdominal aortic aneurysm, without rupture: Secondary | ICD-10-CM | POA: Diagnosis not present

## 2016-11-24 DIAGNOSIS — F325 Major depressive disorder, single episode, in full remission: Secondary | ICD-10-CM | POA: Diagnosis not present

## 2016-11-24 DIAGNOSIS — G629 Polyneuropathy, unspecified: Secondary | ICD-10-CM | POA: Diagnosis not present

## 2016-11-24 DIAGNOSIS — Z96641 Presence of right artificial hip joint: Secondary | ICD-10-CM | POA: Diagnosis not present

## 2016-11-24 DIAGNOSIS — M80051D Age-related osteoporosis with current pathological fracture, right femur, subsequent encounter for fracture with routine healing: Secondary | ICD-10-CM | POA: Diagnosis not present

## 2016-11-24 DIAGNOSIS — I714 Abdominal aortic aneurysm, without rupture: Secondary | ICD-10-CM | POA: Diagnosis not present

## 2016-11-24 DIAGNOSIS — I739 Peripheral vascular disease, unspecified: Secondary | ICD-10-CM | POA: Diagnosis not present

## 2016-11-24 DIAGNOSIS — I10 Essential (primary) hypertension: Secondary | ICD-10-CM | POA: Diagnosis not present

## 2016-11-24 DIAGNOSIS — J431 Panlobular emphysema: Secondary | ICD-10-CM | POA: Diagnosis not present

## 2016-11-24 DIAGNOSIS — Z9181 History of falling: Secondary | ICD-10-CM | POA: Diagnosis not present

## 2016-11-25 DIAGNOSIS — M80051D Age-related osteoporosis with current pathological fracture, right femur, subsequent encounter for fracture with routine healing: Secondary | ICD-10-CM | POA: Diagnosis not present

## 2016-11-25 DIAGNOSIS — I10 Essential (primary) hypertension: Secondary | ICD-10-CM | POA: Diagnosis not present

## 2016-11-25 DIAGNOSIS — I739 Peripheral vascular disease, unspecified: Secondary | ICD-10-CM | POA: Diagnosis not present

## 2016-11-25 DIAGNOSIS — I714 Abdominal aortic aneurysm, without rupture: Secondary | ICD-10-CM | POA: Diagnosis not present

## 2016-11-25 DIAGNOSIS — G629 Polyneuropathy, unspecified: Secondary | ICD-10-CM | POA: Diagnosis not present

## 2016-11-25 DIAGNOSIS — F325 Major depressive disorder, single episode, in full remission: Secondary | ICD-10-CM | POA: Diagnosis not present

## 2016-11-25 DIAGNOSIS — Z9181 History of falling: Secondary | ICD-10-CM | POA: Diagnosis not present

## 2016-11-25 DIAGNOSIS — Z96641 Presence of right artificial hip joint: Secondary | ICD-10-CM | POA: Diagnosis not present

## 2016-11-25 DIAGNOSIS — J431 Panlobular emphysema: Secondary | ICD-10-CM | POA: Diagnosis not present

## 2016-11-26 DIAGNOSIS — G629 Polyneuropathy, unspecified: Secondary | ICD-10-CM | POA: Diagnosis not present

## 2016-11-26 DIAGNOSIS — I739 Peripheral vascular disease, unspecified: Secondary | ICD-10-CM | POA: Diagnosis not present

## 2016-11-26 DIAGNOSIS — I10 Essential (primary) hypertension: Secondary | ICD-10-CM | POA: Diagnosis not present

## 2016-11-26 DIAGNOSIS — M80051D Age-related osteoporosis with current pathological fracture, right femur, subsequent encounter for fracture with routine healing: Secondary | ICD-10-CM | POA: Diagnosis not present

## 2016-11-26 DIAGNOSIS — F325 Major depressive disorder, single episode, in full remission: Secondary | ICD-10-CM | POA: Diagnosis not present

## 2016-11-26 DIAGNOSIS — J431 Panlobular emphysema: Secondary | ICD-10-CM | POA: Diagnosis not present

## 2016-11-26 DIAGNOSIS — Z96641 Presence of right artificial hip joint: Secondary | ICD-10-CM | POA: Diagnosis not present

## 2016-11-26 DIAGNOSIS — Z9181 History of falling: Secondary | ICD-10-CM | POA: Diagnosis not present

## 2016-11-26 DIAGNOSIS — I714 Abdominal aortic aneurysm, without rupture: Secondary | ICD-10-CM | POA: Diagnosis not present

## 2016-11-29 DIAGNOSIS — M80051D Age-related osteoporosis with current pathological fracture, right femur, subsequent encounter for fracture with routine healing: Secondary | ICD-10-CM | POA: Diagnosis not present

## 2016-11-29 DIAGNOSIS — F325 Major depressive disorder, single episode, in full remission: Secondary | ICD-10-CM | POA: Diagnosis not present

## 2016-11-29 DIAGNOSIS — I10 Essential (primary) hypertension: Secondary | ICD-10-CM | POA: Diagnosis not present

## 2016-11-29 DIAGNOSIS — Z9181 History of falling: Secondary | ICD-10-CM | POA: Diagnosis not present

## 2016-11-29 DIAGNOSIS — I739 Peripheral vascular disease, unspecified: Secondary | ICD-10-CM | POA: Diagnosis not present

## 2016-11-29 DIAGNOSIS — G629 Polyneuropathy, unspecified: Secondary | ICD-10-CM | POA: Diagnosis not present

## 2016-11-29 DIAGNOSIS — Z96641 Presence of right artificial hip joint: Secondary | ICD-10-CM | POA: Diagnosis not present

## 2016-11-29 DIAGNOSIS — I714 Abdominal aortic aneurysm, without rupture: Secondary | ICD-10-CM | POA: Diagnosis not present

## 2016-11-29 DIAGNOSIS — J431 Panlobular emphysema: Secondary | ICD-10-CM | POA: Diagnosis not present

## 2016-11-30 DIAGNOSIS — I10 Essential (primary) hypertension: Secondary | ICD-10-CM | POA: Diagnosis not present

## 2016-11-30 DIAGNOSIS — G629 Polyneuropathy, unspecified: Secondary | ICD-10-CM | POA: Diagnosis not present

## 2016-11-30 DIAGNOSIS — J431 Panlobular emphysema: Secondary | ICD-10-CM | POA: Diagnosis not present

## 2016-11-30 DIAGNOSIS — Z96641 Presence of right artificial hip joint: Secondary | ICD-10-CM | POA: Diagnosis not present

## 2016-11-30 DIAGNOSIS — F325 Major depressive disorder, single episode, in full remission: Secondary | ICD-10-CM | POA: Diagnosis not present

## 2016-11-30 DIAGNOSIS — Z9181 History of falling: Secondary | ICD-10-CM | POA: Diagnosis not present

## 2016-11-30 DIAGNOSIS — I714 Abdominal aortic aneurysm, without rupture: Secondary | ICD-10-CM | POA: Diagnosis not present

## 2016-11-30 DIAGNOSIS — M80051D Age-related osteoporosis with current pathological fracture, right femur, subsequent encounter for fracture with routine healing: Secondary | ICD-10-CM | POA: Diagnosis not present

## 2016-11-30 DIAGNOSIS — I739 Peripheral vascular disease, unspecified: Secondary | ICD-10-CM | POA: Diagnosis not present

## 2016-12-01 DIAGNOSIS — Z9181 History of falling: Secondary | ICD-10-CM | POA: Diagnosis not present

## 2016-12-01 DIAGNOSIS — I739 Peripheral vascular disease, unspecified: Secondary | ICD-10-CM | POA: Diagnosis not present

## 2016-12-01 DIAGNOSIS — Z96641 Presence of right artificial hip joint: Secondary | ICD-10-CM | POA: Diagnosis not present

## 2016-12-01 DIAGNOSIS — I714 Abdominal aortic aneurysm, without rupture: Secondary | ICD-10-CM | POA: Diagnosis not present

## 2016-12-01 DIAGNOSIS — F325 Major depressive disorder, single episode, in full remission: Secondary | ICD-10-CM | POA: Diagnosis not present

## 2016-12-01 DIAGNOSIS — M80051D Age-related osteoporosis with current pathological fracture, right femur, subsequent encounter for fracture with routine healing: Secondary | ICD-10-CM | POA: Diagnosis not present

## 2016-12-01 DIAGNOSIS — I10 Essential (primary) hypertension: Secondary | ICD-10-CM | POA: Diagnosis not present

## 2016-12-01 DIAGNOSIS — G629 Polyneuropathy, unspecified: Secondary | ICD-10-CM | POA: Diagnosis not present

## 2016-12-01 DIAGNOSIS — J431 Panlobular emphysema: Secondary | ICD-10-CM | POA: Diagnosis not present

## 2016-12-02 DIAGNOSIS — I10 Essential (primary) hypertension: Secondary | ICD-10-CM | POA: Diagnosis not present

## 2016-12-02 DIAGNOSIS — I714 Abdominal aortic aneurysm, without rupture: Secondary | ICD-10-CM | POA: Diagnosis not present

## 2016-12-02 DIAGNOSIS — F325 Major depressive disorder, single episode, in full remission: Secondary | ICD-10-CM | POA: Diagnosis not present

## 2016-12-02 DIAGNOSIS — Z9181 History of falling: Secondary | ICD-10-CM | POA: Diagnosis not present

## 2016-12-02 DIAGNOSIS — M80051D Age-related osteoporosis with current pathological fracture, right femur, subsequent encounter for fracture with routine healing: Secondary | ICD-10-CM | POA: Diagnosis not present

## 2016-12-02 DIAGNOSIS — J431 Panlobular emphysema: Secondary | ICD-10-CM | POA: Diagnosis not present

## 2016-12-02 DIAGNOSIS — G629 Polyneuropathy, unspecified: Secondary | ICD-10-CM | POA: Diagnosis not present

## 2016-12-02 DIAGNOSIS — Z96641 Presence of right artificial hip joint: Secondary | ICD-10-CM | POA: Diagnosis not present

## 2016-12-02 DIAGNOSIS — I739 Peripheral vascular disease, unspecified: Secondary | ICD-10-CM | POA: Diagnosis not present

## 2016-12-03 DIAGNOSIS — I739 Peripheral vascular disease, unspecified: Secondary | ICD-10-CM | POA: Diagnosis not present

## 2016-12-03 DIAGNOSIS — M80051D Age-related osteoporosis with current pathological fracture, right femur, subsequent encounter for fracture with routine healing: Secondary | ICD-10-CM | POA: Diagnosis not present

## 2016-12-03 DIAGNOSIS — Z96641 Presence of right artificial hip joint: Secondary | ICD-10-CM | POA: Diagnosis not present

## 2016-12-03 DIAGNOSIS — J431 Panlobular emphysema: Secondary | ICD-10-CM | POA: Diagnosis not present

## 2016-12-03 DIAGNOSIS — I10 Essential (primary) hypertension: Secondary | ICD-10-CM | POA: Diagnosis not present

## 2016-12-03 DIAGNOSIS — G629 Polyneuropathy, unspecified: Secondary | ICD-10-CM | POA: Diagnosis not present

## 2016-12-03 DIAGNOSIS — Z9181 History of falling: Secondary | ICD-10-CM | POA: Diagnosis not present

## 2016-12-03 DIAGNOSIS — I714 Abdominal aortic aneurysm, without rupture: Secondary | ICD-10-CM | POA: Diagnosis not present

## 2016-12-03 DIAGNOSIS — F325 Major depressive disorder, single episode, in full remission: Secondary | ICD-10-CM | POA: Diagnosis not present

## 2016-12-06 DIAGNOSIS — I739 Peripheral vascular disease, unspecified: Secondary | ICD-10-CM | POA: Diagnosis not present

## 2016-12-06 DIAGNOSIS — Z96641 Presence of right artificial hip joint: Secondary | ICD-10-CM | POA: Diagnosis not present

## 2016-12-06 DIAGNOSIS — G629 Polyneuropathy, unspecified: Secondary | ICD-10-CM | POA: Diagnosis not present

## 2016-12-06 DIAGNOSIS — I714 Abdominal aortic aneurysm, without rupture: Secondary | ICD-10-CM | POA: Diagnosis not present

## 2016-12-06 DIAGNOSIS — Z9181 History of falling: Secondary | ICD-10-CM | POA: Diagnosis not present

## 2016-12-06 DIAGNOSIS — M80051D Age-related osteoporosis with current pathological fracture, right femur, subsequent encounter for fracture with routine healing: Secondary | ICD-10-CM | POA: Diagnosis not present

## 2016-12-06 DIAGNOSIS — F325 Major depressive disorder, single episode, in full remission: Secondary | ICD-10-CM | POA: Diagnosis not present

## 2016-12-06 DIAGNOSIS — J431 Panlobular emphysema: Secondary | ICD-10-CM | POA: Diagnosis not present

## 2016-12-06 DIAGNOSIS — I10 Essential (primary) hypertension: Secondary | ICD-10-CM | POA: Diagnosis not present

## 2016-12-07 DIAGNOSIS — I739 Peripheral vascular disease, unspecified: Secondary | ICD-10-CM | POA: Diagnosis not present

## 2016-12-07 DIAGNOSIS — G629 Polyneuropathy, unspecified: Secondary | ICD-10-CM | POA: Diagnosis not present

## 2016-12-07 DIAGNOSIS — Z96641 Presence of right artificial hip joint: Secondary | ICD-10-CM | POA: Diagnosis not present

## 2016-12-07 DIAGNOSIS — J431 Panlobular emphysema: Secondary | ICD-10-CM | POA: Diagnosis not present

## 2016-12-07 DIAGNOSIS — Z9181 History of falling: Secondary | ICD-10-CM | POA: Diagnosis not present

## 2016-12-07 DIAGNOSIS — I714 Abdominal aortic aneurysm, without rupture: Secondary | ICD-10-CM | POA: Diagnosis not present

## 2016-12-07 DIAGNOSIS — F325 Major depressive disorder, single episode, in full remission: Secondary | ICD-10-CM | POA: Diagnosis not present

## 2016-12-07 DIAGNOSIS — M80051D Age-related osteoporosis with current pathological fracture, right femur, subsequent encounter for fracture with routine healing: Secondary | ICD-10-CM | POA: Diagnosis not present

## 2016-12-07 DIAGNOSIS — I10 Essential (primary) hypertension: Secondary | ICD-10-CM | POA: Diagnosis not present

## 2016-12-10 DIAGNOSIS — M1611 Unilateral primary osteoarthritis, right hip: Secondary | ICD-10-CM | POA: Diagnosis not present

## 2016-12-10 DIAGNOSIS — Z96649 Presence of unspecified artificial hip joint: Secondary | ICD-10-CM | POA: Diagnosis not present

## 2016-12-13 DIAGNOSIS — M79675 Pain in left toe(s): Secondary | ICD-10-CM | POA: Diagnosis not present

## 2016-12-13 DIAGNOSIS — B351 Tinea unguium: Secondary | ICD-10-CM | POA: Diagnosis not present

## 2016-12-13 DIAGNOSIS — M79674 Pain in right toe(s): Secondary | ICD-10-CM | POA: Diagnosis not present

## 2016-12-14 DIAGNOSIS — Z9181 History of falling: Secondary | ICD-10-CM | POA: Diagnosis not present

## 2016-12-14 DIAGNOSIS — I739 Peripheral vascular disease, unspecified: Secondary | ICD-10-CM | POA: Diagnosis not present

## 2016-12-14 DIAGNOSIS — I10 Essential (primary) hypertension: Secondary | ICD-10-CM | POA: Diagnosis not present

## 2016-12-14 DIAGNOSIS — F325 Major depressive disorder, single episode, in full remission: Secondary | ICD-10-CM | POA: Diagnosis not present

## 2016-12-14 DIAGNOSIS — Z96641 Presence of right artificial hip joint: Secondary | ICD-10-CM | POA: Diagnosis not present

## 2016-12-14 DIAGNOSIS — I714 Abdominal aortic aneurysm, without rupture: Secondary | ICD-10-CM | POA: Diagnosis not present

## 2016-12-14 DIAGNOSIS — J431 Panlobular emphysema: Secondary | ICD-10-CM | POA: Diagnosis not present

## 2016-12-14 DIAGNOSIS — M80051D Age-related osteoporosis with current pathological fracture, right femur, subsequent encounter for fracture with routine healing: Secondary | ICD-10-CM | POA: Diagnosis not present

## 2016-12-14 DIAGNOSIS — G629 Polyneuropathy, unspecified: Secondary | ICD-10-CM | POA: Diagnosis not present

## 2016-12-16 DIAGNOSIS — J431 Panlobular emphysema: Secondary | ICD-10-CM | POA: Diagnosis not present

## 2016-12-16 DIAGNOSIS — Z96641 Presence of right artificial hip joint: Secondary | ICD-10-CM | POA: Diagnosis not present

## 2016-12-16 DIAGNOSIS — I739 Peripheral vascular disease, unspecified: Secondary | ICD-10-CM | POA: Diagnosis not present

## 2016-12-16 DIAGNOSIS — M80051D Age-related osteoporosis with current pathological fracture, right femur, subsequent encounter for fracture with routine healing: Secondary | ICD-10-CM | POA: Diagnosis not present

## 2016-12-16 DIAGNOSIS — F325 Major depressive disorder, single episode, in full remission: Secondary | ICD-10-CM | POA: Diagnosis not present

## 2016-12-16 DIAGNOSIS — I714 Abdominal aortic aneurysm, without rupture: Secondary | ICD-10-CM | POA: Diagnosis not present

## 2016-12-16 DIAGNOSIS — I10 Essential (primary) hypertension: Secondary | ICD-10-CM | POA: Diagnosis not present

## 2016-12-16 DIAGNOSIS — G629 Polyneuropathy, unspecified: Secondary | ICD-10-CM | POA: Diagnosis not present

## 2016-12-16 DIAGNOSIS — Z9181 History of falling: Secondary | ICD-10-CM | POA: Diagnosis not present

## 2016-12-20 DIAGNOSIS — J431 Panlobular emphysema: Secondary | ICD-10-CM | POA: Diagnosis not present

## 2016-12-20 DIAGNOSIS — M80051D Age-related osteoporosis with current pathological fracture, right femur, subsequent encounter for fracture with routine healing: Secondary | ICD-10-CM | POA: Diagnosis not present

## 2016-12-20 DIAGNOSIS — Z96641 Presence of right artificial hip joint: Secondary | ICD-10-CM | POA: Diagnosis not present

## 2016-12-20 DIAGNOSIS — Z9181 History of falling: Secondary | ICD-10-CM | POA: Diagnosis not present

## 2016-12-20 DIAGNOSIS — I739 Peripheral vascular disease, unspecified: Secondary | ICD-10-CM | POA: Diagnosis not present

## 2016-12-20 DIAGNOSIS — I714 Abdominal aortic aneurysm, without rupture: Secondary | ICD-10-CM | POA: Diagnosis not present

## 2016-12-20 DIAGNOSIS — I10 Essential (primary) hypertension: Secondary | ICD-10-CM | POA: Diagnosis not present

## 2016-12-20 DIAGNOSIS — F325 Major depressive disorder, single episode, in full remission: Secondary | ICD-10-CM | POA: Diagnosis not present

## 2016-12-20 DIAGNOSIS — G629 Polyneuropathy, unspecified: Secondary | ICD-10-CM | POA: Diagnosis not present

## 2016-12-23 DIAGNOSIS — Z9181 History of falling: Secondary | ICD-10-CM | POA: Diagnosis not present

## 2016-12-23 DIAGNOSIS — G629 Polyneuropathy, unspecified: Secondary | ICD-10-CM | POA: Diagnosis not present

## 2016-12-23 DIAGNOSIS — I714 Abdominal aortic aneurysm, without rupture: Secondary | ICD-10-CM | POA: Diagnosis not present

## 2016-12-23 DIAGNOSIS — J431 Panlobular emphysema: Secondary | ICD-10-CM | POA: Diagnosis not present

## 2016-12-23 DIAGNOSIS — F325 Major depressive disorder, single episode, in full remission: Secondary | ICD-10-CM | POA: Diagnosis not present

## 2016-12-23 DIAGNOSIS — M80051D Age-related osteoporosis with current pathological fracture, right femur, subsequent encounter for fracture with routine healing: Secondary | ICD-10-CM | POA: Diagnosis not present

## 2016-12-23 DIAGNOSIS — I739 Peripheral vascular disease, unspecified: Secondary | ICD-10-CM | POA: Diagnosis not present

## 2016-12-23 DIAGNOSIS — Z96641 Presence of right artificial hip joint: Secondary | ICD-10-CM | POA: Diagnosis not present

## 2016-12-23 DIAGNOSIS — I10 Essential (primary) hypertension: Secondary | ICD-10-CM | POA: Diagnosis not present

## 2016-12-28 DIAGNOSIS — I714 Abdominal aortic aneurysm, without rupture: Secondary | ICD-10-CM | POA: Diagnosis not present

## 2016-12-28 DIAGNOSIS — F325 Major depressive disorder, single episode, in full remission: Secondary | ICD-10-CM | POA: Diagnosis not present

## 2016-12-28 DIAGNOSIS — I10 Essential (primary) hypertension: Secondary | ICD-10-CM | POA: Diagnosis not present

## 2016-12-28 DIAGNOSIS — J431 Panlobular emphysema: Secondary | ICD-10-CM | POA: Diagnosis not present

## 2016-12-28 DIAGNOSIS — I739 Peripheral vascular disease, unspecified: Secondary | ICD-10-CM | POA: Diagnosis not present

## 2016-12-28 DIAGNOSIS — M80051D Age-related osteoporosis with current pathological fracture, right femur, subsequent encounter for fracture with routine healing: Secondary | ICD-10-CM | POA: Diagnosis not present

## 2016-12-28 DIAGNOSIS — G629 Polyneuropathy, unspecified: Secondary | ICD-10-CM | POA: Diagnosis not present

## 2016-12-28 DIAGNOSIS — Z9181 History of falling: Secondary | ICD-10-CM | POA: Diagnosis not present

## 2016-12-28 DIAGNOSIS — Z96641 Presence of right artificial hip joint: Secondary | ICD-10-CM | POA: Diagnosis not present

## 2016-12-29 DIAGNOSIS — I10 Essential (primary) hypertension: Secondary | ICD-10-CM | POA: Diagnosis not present

## 2016-12-29 DIAGNOSIS — Z96641 Presence of right artificial hip joint: Secondary | ICD-10-CM | POA: Diagnosis not present

## 2016-12-29 DIAGNOSIS — F325 Major depressive disorder, single episode, in full remission: Secondary | ICD-10-CM | POA: Diagnosis not present

## 2016-12-29 DIAGNOSIS — I739 Peripheral vascular disease, unspecified: Secondary | ICD-10-CM | POA: Diagnosis not present

## 2016-12-29 DIAGNOSIS — M80051D Age-related osteoporosis with current pathological fracture, right femur, subsequent encounter for fracture with routine healing: Secondary | ICD-10-CM | POA: Diagnosis not present

## 2016-12-29 DIAGNOSIS — G629 Polyneuropathy, unspecified: Secondary | ICD-10-CM | POA: Diagnosis not present

## 2016-12-29 DIAGNOSIS — Z9181 History of falling: Secondary | ICD-10-CM | POA: Diagnosis not present

## 2016-12-29 DIAGNOSIS — I714 Abdominal aortic aneurysm, without rupture: Secondary | ICD-10-CM | POA: Diagnosis not present

## 2016-12-29 DIAGNOSIS — J431 Panlobular emphysema: Secondary | ICD-10-CM | POA: Diagnosis not present

## 2016-12-31 DIAGNOSIS — J431 Panlobular emphysema: Secondary | ICD-10-CM | POA: Diagnosis not present

## 2016-12-31 DIAGNOSIS — G629 Polyneuropathy, unspecified: Secondary | ICD-10-CM | POA: Diagnosis not present

## 2016-12-31 DIAGNOSIS — I10 Essential (primary) hypertension: Secondary | ICD-10-CM | POA: Diagnosis not present

## 2016-12-31 DIAGNOSIS — F325 Major depressive disorder, single episode, in full remission: Secondary | ICD-10-CM | POA: Diagnosis not present

## 2016-12-31 DIAGNOSIS — I714 Abdominal aortic aneurysm, without rupture: Secondary | ICD-10-CM | POA: Diagnosis not present

## 2016-12-31 DIAGNOSIS — I739 Peripheral vascular disease, unspecified: Secondary | ICD-10-CM | POA: Diagnosis not present

## 2016-12-31 DIAGNOSIS — Z96641 Presence of right artificial hip joint: Secondary | ICD-10-CM | POA: Diagnosis not present

## 2016-12-31 DIAGNOSIS — Z9181 History of falling: Secondary | ICD-10-CM | POA: Diagnosis not present

## 2016-12-31 DIAGNOSIS — M80051D Age-related osteoporosis with current pathological fracture, right femur, subsequent encounter for fracture with routine healing: Secondary | ICD-10-CM | POA: Diagnosis not present

## 2017-01-04 DIAGNOSIS — Z9181 History of falling: Secondary | ICD-10-CM | POA: Diagnosis not present

## 2017-01-04 DIAGNOSIS — I739 Peripheral vascular disease, unspecified: Secondary | ICD-10-CM | POA: Diagnosis not present

## 2017-01-04 DIAGNOSIS — F325 Major depressive disorder, single episode, in full remission: Secondary | ICD-10-CM | POA: Diagnosis not present

## 2017-01-04 DIAGNOSIS — G629 Polyneuropathy, unspecified: Secondary | ICD-10-CM | POA: Diagnosis not present

## 2017-01-04 DIAGNOSIS — I10 Essential (primary) hypertension: Secondary | ICD-10-CM | POA: Diagnosis not present

## 2017-01-04 DIAGNOSIS — Z96641 Presence of right artificial hip joint: Secondary | ICD-10-CM | POA: Diagnosis not present

## 2017-01-04 DIAGNOSIS — J431 Panlobular emphysema: Secondary | ICD-10-CM | POA: Diagnosis not present

## 2017-01-04 DIAGNOSIS — I714 Abdominal aortic aneurysm, without rupture: Secondary | ICD-10-CM | POA: Diagnosis not present

## 2017-01-04 DIAGNOSIS — M80051D Age-related osteoporosis with current pathological fracture, right femur, subsequent encounter for fracture with routine healing: Secondary | ICD-10-CM | POA: Diagnosis not present

## 2017-01-05 DIAGNOSIS — I10 Essential (primary) hypertension: Secondary | ICD-10-CM | POA: Diagnosis not present

## 2017-01-05 DIAGNOSIS — I739 Peripheral vascular disease, unspecified: Secondary | ICD-10-CM | POA: Diagnosis not present

## 2017-01-05 DIAGNOSIS — F325 Major depressive disorder, single episode, in full remission: Secondary | ICD-10-CM | POA: Diagnosis not present

## 2017-01-05 DIAGNOSIS — I714 Abdominal aortic aneurysm, without rupture: Secondary | ICD-10-CM | POA: Diagnosis not present

## 2017-01-05 DIAGNOSIS — Z9181 History of falling: Secondary | ICD-10-CM | POA: Diagnosis not present

## 2017-01-05 DIAGNOSIS — Z96641 Presence of right artificial hip joint: Secondary | ICD-10-CM | POA: Diagnosis not present

## 2017-01-05 DIAGNOSIS — M80051D Age-related osteoporosis with current pathological fracture, right femur, subsequent encounter for fracture with routine healing: Secondary | ICD-10-CM | POA: Diagnosis not present

## 2017-01-05 DIAGNOSIS — J431 Panlobular emphysema: Secondary | ICD-10-CM | POA: Diagnosis not present

## 2017-01-05 DIAGNOSIS — G629 Polyneuropathy, unspecified: Secondary | ICD-10-CM | POA: Diagnosis not present

## 2017-01-10 DIAGNOSIS — J431 Panlobular emphysema: Secondary | ICD-10-CM | POA: Diagnosis not present

## 2017-01-10 DIAGNOSIS — I10 Essential (primary) hypertension: Secondary | ICD-10-CM | POA: Diagnosis not present

## 2017-01-10 DIAGNOSIS — J441 Chronic obstructive pulmonary disease with (acute) exacerbation: Secondary | ICD-10-CM | POA: Diagnosis not present

## 2017-01-10 DIAGNOSIS — Z79899 Other long term (current) drug therapy: Secondary | ICD-10-CM | POA: Diagnosis not present

## 2017-01-10 DIAGNOSIS — Z1329 Encounter for screening for other suspected endocrine disorder: Secondary | ICD-10-CM | POA: Diagnosis not present

## 2017-01-10 DIAGNOSIS — E7800 Pure hypercholesterolemia, unspecified: Secondary | ICD-10-CM | POA: Diagnosis not present

## 2017-01-11 DIAGNOSIS — Z1329 Encounter for screening for other suspected endocrine disorder: Secondary | ICD-10-CM | POA: Diagnosis not present

## 2017-01-11 DIAGNOSIS — Z79899 Other long term (current) drug therapy: Secondary | ICD-10-CM | POA: Diagnosis not present

## 2017-01-11 DIAGNOSIS — J431 Panlobular emphysema: Secondary | ICD-10-CM | POA: Diagnosis not present

## 2017-01-11 DIAGNOSIS — E7800 Pure hypercholesterolemia, unspecified: Secondary | ICD-10-CM | POA: Diagnosis not present

## 2017-01-11 DIAGNOSIS — I10 Essential (primary) hypertension: Secondary | ICD-10-CM | POA: Diagnosis not present

## 2017-01-12 DIAGNOSIS — I10 Essential (primary) hypertension: Secondary | ICD-10-CM | POA: Diagnosis not present

## 2017-01-12 DIAGNOSIS — G629 Polyneuropathy, unspecified: Secondary | ICD-10-CM | POA: Diagnosis not present

## 2017-01-12 DIAGNOSIS — E559 Vitamin D deficiency, unspecified: Secondary | ICD-10-CM | POA: Diagnosis not present

## 2017-01-12 DIAGNOSIS — M80051D Age-related osteoporosis with current pathological fracture, right femur, subsequent encounter for fracture with routine healing: Secondary | ICD-10-CM | POA: Diagnosis not present

## 2017-01-12 DIAGNOSIS — J431 Panlobular emphysema: Secondary | ICD-10-CM | POA: Diagnosis not present

## 2017-01-12 DIAGNOSIS — F325 Major depressive disorder, single episode, in full remission: Secondary | ICD-10-CM | POA: Diagnosis not present

## 2017-01-12 DIAGNOSIS — I714 Abdominal aortic aneurysm, without rupture: Secondary | ICD-10-CM | POA: Diagnosis not present

## 2017-01-12 DIAGNOSIS — I739 Peripheral vascular disease, unspecified: Secondary | ICD-10-CM | POA: Diagnosis not present

## 2017-01-12 DIAGNOSIS — M6281 Muscle weakness (generalized): Secondary | ICD-10-CM | POA: Diagnosis not present

## 2017-01-13 DIAGNOSIS — G629 Polyneuropathy, unspecified: Secondary | ICD-10-CM | POA: Diagnosis not present

## 2017-01-13 DIAGNOSIS — M80051D Age-related osteoporosis with current pathological fracture, right femur, subsequent encounter for fracture with routine healing: Secondary | ICD-10-CM | POA: Diagnosis not present

## 2017-01-13 DIAGNOSIS — E559 Vitamin D deficiency, unspecified: Secondary | ICD-10-CM | POA: Diagnosis not present

## 2017-01-13 DIAGNOSIS — F325 Major depressive disorder, single episode, in full remission: Secondary | ICD-10-CM | POA: Diagnosis not present

## 2017-01-13 DIAGNOSIS — I10 Essential (primary) hypertension: Secondary | ICD-10-CM | POA: Diagnosis not present

## 2017-01-13 DIAGNOSIS — M6281 Muscle weakness (generalized): Secondary | ICD-10-CM | POA: Diagnosis not present

## 2017-01-13 DIAGNOSIS — J431 Panlobular emphysema: Secondary | ICD-10-CM | POA: Diagnosis not present

## 2017-01-13 DIAGNOSIS — I739 Peripheral vascular disease, unspecified: Secondary | ICD-10-CM | POA: Diagnosis not present

## 2017-01-13 DIAGNOSIS — I714 Abdominal aortic aneurysm, without rupture: Secondary | ICD-10-CM | POA: Diagnosis not present

## 2017-01-18 DIAGNOSIS — F325 Major depressive disorder, single episode, in full remission: Secondary | ICD-10-CM | POA: Diagnosis not present

## 2017-01-18 DIAGNOSIS — I739 Peripheral vascular disease, unspecified: Secondary | ICD-10-CM | POA: Diagnosis not present

## 2017-01-18 DIAGNOSIS — M6281 Muscle weakness (generalized): Secondary | ICD-10-CM | POA: Diagnosis not present

## 2017-01-18 DIAGNOSIS — G629 Polyneuropathy, unspecified: Secondary | ICD-10-CM | POA: Diagnosis not present

## 2017-01-18 DIAGNOSIS — E559 Vitamin D deficiency, unspecified: Secondary | ICD-10-CM | POA: Diagnosis not present

## 2017-01-18 DIAGNOSIS — I714 Abdominal aortic aneurysm, without rupture: Secondary | ICD-10-CM | POA: Diagnosis not present

## 2017-01-18 DIAGNOSIS — J431 Panlobular emphysema: Secondary | ICD-10-CM | POA: Diagnosis not present

## 2017-01-18 DIAGNOSIS — I10 Essential (primary) hypertension: Secondary | ICD-10-CM | POA: Diagnosis not present

## 2017-01-18 DIAGNOSIS — M80051D Age-related osteoporosis with current pathological fracture, right femur, subsequent encounter for fracture with routine healing: Secondary | ICD-10-CM | POA: Diagnosis not present

## 2017-01-20 DIAGNOSIS — I714 Abdominal aortic aneurysm, without rupture: Secondary | ICD-10-CM | POA: Diagnosis not present

## 2017-01-20 DIAGNOSIS — E559 Vitamin D deficiency, unspecified: Secondary | ICD-10-CM | POA: Diagnosis not present

## 2017-01-20 DIAGNOSIS — G629 Polyneuropathy, unspecified: Secondary | ICD-10-CM | POA: Diagnosis not present

## 2017-01-20 DIAGNOSIS — I10 Essential (primary) hypertension: Secondary | ICD-10-CM | POA: Diagnosis not present

## 2017-01-20 DIAGNOSIS — M80051D Age-related osteoporosis with current pathological fracture, right femur, subsequent encounter for fracture with routine healing: Secondary | ICD-10-CM | POA: Diagnosis not present

## 2017-01-20 DIAGNOSIS — J431 Panlobular emphysema: Secondary | ICD-10-CM | POA: Diagnosis not present

## 2017-01-20 DIAGNOSIS — F325 Major depressive disorder, single episode, in full remission: Secondary | ICD-10-CM | POA: Diagnosis not present

## 2017-01-20 DIAGNOSIS — I739 Peripheral vascular disease, unspecified: Secondary | ICD-10-CM | POA: Diagnosis not present

## 2017-01-20 DIAGNOSIS — M6281 Muscle weakness (generalized): Secondary | ICD-10-CM | POA: Diagnosis not present

## 2017-01-24 DIAGNOSIS — F325 Major depressive disorder, single episode, in full remission: Secondary | ICD-10-CM | POA: Diagnosis not present

## 2017-01-24 DIAGNOSIS — I739 Peripheral vascular disease, unspecified: Secondary | ICD-10-CM | POA: Diagnosis not present

## 2017-01-24 DIAGNOSIS — J431 Panlobular emphysema: Secondary | ICD-10-CM | POA: Diagnosis not present

## 2017-01-24 DIAGNOSIS — E559 Vitamin D deficiency, unspecified: Secondary | ICD-10-CM | POA: Diagnosis not present

## 2017-01-24 DIAGNOSIS — G629 Polyneuropathy, unspecified: Secondary | ICD-10-CM | POA: Diagnosis not present

## 2017-01-24 DIAGNOSIS — M6281 Muscle weakness (generalized): Secondary | ICD-10-CM | POA: Diagnosis not present

## 2017-01-24 DIAGNOSIS — I10 Essential (primary) hypertension: Secondary | ICD-10-CM | POA: Diagnosis not present

## 2017-01-24 DIAGNOSIS — I714 Abdominal aortic aneurysm, without rupture: Secondary | ICD-10-CM | POA: Diagnosis not present

## 2017-01-24 DIAGNOSIS — M80051D Age-related osteoporosis with current pathological fracture, right femur, subsequent encounter for fracture with routine healing: Secondary | ICD-10-CM | POA: Diagnosis not present

## 2017-01-25 DIAGNOSIS — I10 Essential (primary) hypertension: Secondary | ICD-10-CM | POA: Diagnosis not present

## 2017-01-25 DIAGNOSIS — M6281 Muscle weakness (generalized): Secondary | ICD-10-CM | POA: Diagnosis not present

## 2017-01-25 DIAGNOSIS — J431 Panlobular emphysema: Secondary | ICD-10-CM | POA: Diagnosis not present

## 2017-01-25 DIAGNOSIS — I714 Abdominal aortic aneurysm, without rupture: Secondary | ICD-10-CM | POA: Diagnosis not present

## 2017-01-25 DIAGNOSIS — M80051D Age-related osteoporosis with current pathological fracture, right femur, subsequent encounter for fracture with routine healing: Secondary | ICD-10-CM | POA: Diagnosis not present

## 2017-01-26 DIAGNOSIS — G629 Polyneuropathy, unspecified: Secondary | ICD-10-CM | POA: Diagnosis not present

## 2017-01-26 DIAGNOSIS — J431 Panlobular emphysema: Secondary | ICD-10-CM | POA: Diagnosis not present

## 2017-01-26 DIAGNOSIS — M80051D Age-related osteoporosis with current pathological fracture, right femur, subsequent encounter for fracture with routine healing: Secondary | ICD-10-CM | POA: Diagnosis not present

## 2017-01-26 DIAGNOSIS — I714 Abdominal aortic aneurysm, without rupture: Secondary | ICD-10-CM | POA: Diagnosis not present

## 2017-01-26 DIAGNOSIS — M6281 Muscle weakness (generalized): Secondary | ICD-10-CM | POA: Diagnosis not present

## 2017-01-26 DIAGNOSIS — E559 Vitamin D deficiency, unspecified: Secondary | ICD-10-CM | POA: Diagnosis not present

## 2017-01-26 DIAGNOSIS — I739 Peripheral vascular disease, unspecified: Secondary | ICD-10-CM | POA: Diagnosis not present

## 2017-01-26 DIAGNOSIS — F325 Major depressive disorder, single episode, in full remission: Secondary | ICD-10-CM | POA: Diagnosis not present

## 2017-01-26 DIAGNOSIS — I10 Essential (primary) hypertension: Secondary | ICD-10-CM | POA: Diagnosis not present

## 2017-01-31 DIAGNOSIS — I739 Peripheral vascular disease, unspecified: Secondary | ICD-10-CM | POA: Diagnosis not present

## 2017-01-31 DIAGNOSIS — G629 Polyneuropathy, unspecified: Secondary | ICD-10-CM | POA: Diagnosis not present

## 2017-01-31 DIAGNOSIS — E559 Vitamin D deficiency, unspecified: Secondary | ICD-10-CM | POA: Diagnosis not present

## 2017-01-31 DIAGNOSIS — J431 Panlobular emphysema: Secondary | ICD-10-CM | POA: Diagnosis not present

## 2017-01-31 DIAGNOSIS — M6281 Muscle weakness (generalized): Secondary | ICD-10-CM | POA: Diagnosis not present

## 2017-01-31 DIAGNOSIS — M80051D Age-related osteoporosis with current pathological fracture, right femur, subsequent encounter for fracture with routine healing: Secondary | ICD-10-CM | POA: Diagnosis not present

## 2017-01-31 DIAGNOSIS — F325 Major depressive disorder, single episode, in full remission: Secondary | ICD-10-CM | POA: Diagnosis not present

## 2017-01-31 DIAGNOSIS — I10 Essential (primary) hypertension: Secondary | ICD-10-CM | POA: Diagnosis not present

## 2017-01-31 DIAGNOSIS — I714 Abdominal aortic aneurysm, without rupture: Secondary | ICD-10-CM | POA: Diagnosis not present

## 2017-02-02 DIAGNOSIS — M80051D Age-related osteoporosis with current pathological fracture, right femur, subsequent encounter for fracture with routine healing: Secondary | ICD-10-CM | POA: Diagnosis not present

## 2017-02-02 DIAGNOSIS — G629 Polyneuropathy, unspecified: Secondary | ICD-10-CM | POA: Diagnosis not present

## 2017-02-02 DIAGNOSIS — F325 Major depressive disorder, single episode, in full remission: Secondary | ICD-10-CM | POA: Diagnosis not present

## 2017-02-02 DIAGNOSIS — I10 Essential (primary) hypertension: Secondary | ICD-10-CM | POA: Diagnosis not present

## 2017-02-02 DIAGNOSIS — J431 Panlobular emphysema: Secondary | ICD-10-CM | POA: Diagnosis not present

## 2017-02-02 DIAGNOSIS — I714 Abdominal aortic aneurysm, without rupture: Secondary | ICD-10-CM | POA: Diagnosis not present

## 2017-02-02 DIAGNOSIS — M6281 Muscle weakness (generalized): Secondary | ICD-10-CM | POA: Diagnosis not present

## 2017-02-02 DIAGNOSIS — I739 Peripheral vascular disease, unspecified: Secondary | ICD-10-CM | POA: Diagnosis not present

## 2017-02-02 DIAGNOSIS — E559 Vitamin D deficiency, unspecified: Secondary | ICD-10-CM | POA: Diagnosis not present

## 2017-02-22 DIAGNOSIS — Z79899 Other long term (current) drug therapy: Secondary | ICD-10-CM | POA: Diagnosis not present

## 2017-02-22 DIAGNOSIS — E559 Vitamin D deficiency, unspecified: Secondary | ICD-10-CM | POA: Diagnosis not present

## 2017-02-22 DIAGNOSIS — F325 Major depressive disorder, single episode, in full remission: Secondary | ICD-10-CM | POA: Diagnosis not present

## 2017-02-22 DIAGNOSIS — I739 Peripheral vascular disease, unspecified: Secondary | ICD-10-CM | POA: Diagnosis not present

## 2017-02-22 DIAGNOSIS — I10 Essential (primary) hypertension: Secondary | ICD-10-CM | POA: Diagnosis not present

## 2017-02-22 DIAGNOSIS — E7800 Pure hypercholesterolemia, unspecified: Secondary | ICD-10-CM | POA: Diagnosis not present

## 2017-02-22 DIAGNOSIS — E538 Deficiency of other specified B group vitamins: Secondary | ICD-10-CM | POA: Diagnosis not present

## 2017-02-22 DIAGNOSIS — J431 Panlobular emphysema: Secondary | ICD-10-CM | POA: Diagnosis not present

## 2017-03-10 DIAGNOSIS — M810 Age-related osteoporosis without current pathological fracture: Secondary | ICD-10-CM | POA: Diagnosis not present

## 2017-03-24 DIAGNOSIS — I739 Peripheral vascular disease, unspecified: Secondary | ICD-10-CM | POA: Diagnosis not present

## 2017-04-21 DIAGNOSIS — I10 Essential (primary) hypertension: Secondary | ICD-10-CM | POA: Diagnosis not present

## 2017-04-21 DIAGNOSIS — J449 Chronic obstructive pulmonary disease, unspecified: Secondary | ICD-10-CM | POA: Diagnosis not present

## 2017-04-21 DIAGNOSIS — S72009D Fracture of unspecified part of neck of unspecified femur, subsequent encounter for closed fracture with routine healing: Secondary | ICD-10-CM | POA: Diagnosis not present

## 2017-04-21 DIAGNOSIS — I739 Peripheral vascular disease, unspecified: Secondary | ICD-10-CM | POA: Diagnosis not present

## 2017-04-21 DIAGNOSIS — E785 Hyperlipidemia, unspecified: Secondary | ICD-10-CM | POA: Diagnosis not present

## 2017-04-21 DIAGNOSIS — J432 Centrilobular emphysema: Secondary | ICD-10-CM | POA: Diagnosis not present

## 2017-04-21 DIAGNOSIS — I671 Cerebral aneurysm, nonruptured: Secondary | ICD-10-CM | POA: Diagnosis not present

## 2017-04-21 DIAGNOSIS — G629 Polyneuropathy, unspecified: Secondary | ICD-10-CM | POA: Diagnosis not present

## 2017-04-21 DIAGNOSIS — I714 Abdominal aortic aneurysm, without rupture: Secondary | ICD-10-CM | POA: Diagnosis not present

## 2017-04-21 DIAGNOSIS — R0609 Other forms of dyspnea: Secondary | ICD-10-CM | POA: Diagnosis not present

## 2017-05-02 DIAGNOSIS — I714 Abdominal aortic aneurysm, without rupture: Secondary | ICD-10-CM | POA: Diagnosis not present

## 2017-05-02 DIAGNOSIS — I739 Peripheral vascular disease, unspecified: Secondary | ICD-10-CM | POA: Diagnosis not present

## 2017-05-02 DIAGNOSIS — Z87891 Personal history of nicotine dependence: Secondary | ICD-10-CM | POA: Diagnosis not present

## 2017-05-02 DIAGNOSIS — Z79899 Other long term (current) drug therapy: Secondary | ICD-10-CM | POA: Diagnosis not present

## 2017-05-02 DIAGNOSIS — Z7902 Long term (current) use of antithrombotics/antiplatelets: Secondary | ICD-10-CM | POA: Diagnosis not present

## 2017-05-02 DIAGNOSIS — J449 Chronic obstructive pulmonary disease, unspecified: Secondary | ICD-10-CM | POA: Diagnosis not present

## 2017-05-02 DIAGNOSIS — I10 Essential (primary) hypertension: Secondary | ICD-10-CM | POA: Diagnosis not present

## 2017-05-02 DIAGNOSIS — E785 Hyperlipidemia, unspecified: Secondary | ICD-10-CM | POA: Diagnosis not present

## 2017-05-02 DIAGNOSIS — I6522 Occlusion and stenosis of left carotid artery: Secondary | ICD-10-CM | POA: Diagnosis not present

## 2017-05-02 DIAGNOSIS — Z95828 Presence of other vascular implants and grafts: Secondary | ICD-10-CM | POA: Diagnosis not present

## 2017-05-02 DIAGNOSIS — Z8673 Personal history of transient ischemic attack (TIA), and cerebral infarction without residual deficits: Secondary | ICD-10-CM | POA: Diagnosis not present

## 2017-05-18 DIAGNOSIS — E538 Deficiency of other specified B group vitamins: Secondary | ICD-10-CM | POA: Diagnosis not present

## 2017-05-18 DIAGNOSIS — E559 Vitamin D deficiency, unspecified: Secondary | ICD-10-CM | POA: Diagnosis not present

## 2017-05-18 DIAGNOSIS — Z79899 Other long term (current) drug therapy: Secondary | ICD-10-CM | POA: Diagnosis not present

## 2017-05-18 DIAGNOSIS — E7800 Pure hypercholesterolemia, unspecified: Secondary | ICD-10-CM | POA: Diagnosis not present

## 2017-05-25 DIAGNOSIS — E538 Deficiency of other specified B group vitamins: Secondary | ICD-10-CM | POA: Diagnosis not present

## 2017-05-25 DIAGNOSIS — R0989 Other specified symptoms and signs involving the circulatory and respiratory systems: Secondary | ICD-10-CM | POA: Diagnosis not present

## 2017-05-25 DIAGNOSIS — Z23 Encounter for immunization: Secondary | ICD-10-CM | POA: Diagnosis not present

## 2017-05-25 DIAGNOSIS — J431 Panlobular emphysema: Secondary | ICD-10-CM | POA: Diagnosis not present

## 2017-05-25 DIAGNOSIS — E559 Vitamin D deficiency, unspecified: Secondary | ICD-10-CM | POA: Diagnosis not present

## 2017-05-25 DIAGNOSIS — I10 Essential (primary) hypertension: Secondary | ICD-10-CM | POA: Diagnosis not present

## 2017-05-25 DIAGNOSIS — E7800 Pure hypercholesterolemia, unspecified: Secondary | ICD-10-CM | POA: Diagnosis not present

## 2017-05-30 DIAGNOSIS — I6523 Occlusion and stenosis of bilateral carotid arteries: Secondary | ICD-10-CM | POA: Diagnosis not present

## 2017-05-30 DIAGNOSIS — R0989 Other specified symptoms and signs involving the circulatory and respiratory systems: Secondary | ICD-10-CM | POA: Diagnosis not present

## 2017-08-04 DIAGNOSIS — J441 Chronic obstructive pulmonary disease with (acute) exacerbation: Secondary | ICD-10-CM | POA: Diagnosis not present

## 2017-08-25 DIAGNOSIS — F3341 Major depressive disorder, recurrent, in partial remission: Secondary | ICD-10-CM | POA: Diagnosis not present

## 2017-08-25 DIAGNOSIS — I729 Aneurysm of unspecified site: Secondary | ICD-10-CM | POA: Diagnosis not present

## 2017-08-25 DIAGNOSIS — E559 Vitamin D deficiency, unspecified: Secondary | ICD-10-CM | POA: Diagnosis not present

## 2017-08-25 DIAGNOSIS — J431 Panlobular emphysema: Secondary | ICD-10-CM | POA: Diagnosis not present

## 2017-08-25 DIAGNOSIS — Z79899 Other long term (current) drug therapy: Secondary | ICD-10-CM | POA: Diagnosis not present

## 2017-08-25 DIAGNOSIS — I739 Peripheral vascular disease, unspecified: Secondary | ICD-10-CM | POA: Diagnosis not present

## 2017-08-25 DIAGNOSIS — I10 Essential (primary) hypertension: Secondary | ICD-10-CM | POA: Diagnosis not present

## 2017-08-25 DIAGNOSIS — N183 Chronic kidney disease, stage 3 (moderate): Secondary | ICD-10-CM | POA: Diagnosis not present

## 2017-08-25 DIAGNOSIS — E538 Deficiency of other specified B group vitamins: Secondary | ICD-10-CM | POA: Diagnosis not present

## 2017-11-23 DIAGNOSIS — J431 Panlobular emphysema: Secondary | ICD-10-CM | POA: Diagnosis not present

## 2017-11-23 DIAGNOSIS — Z0000 Encounter for general adult medical examination without abnormal findings: Secondary | ICD-10-CM | POA: Diagnosis not present

## 2017-11-23 DIAGNOSIS — E559 Vitamin D deficiency, unspecified: Secondary | ICD-10-CM | POA: Diagnosis not present

## 2017-11-23 DIAGNOSIS — E538 Deficiency of other specified B group vitamins: Secondary | ICD-10-CM | POA: Diagnosis not present

## 2017-11-23 DIAGNOSIS — F325 Major depressive disorder, single episode, in full remission: Secondary | ICD-10-CM | POA: Diagnosis not present

## 2017-11-23 DIAGNOSIS — I739 Peripheral vascular disease, unspecified: Secondary | ICD-10-CM | POA: Diagnosis not present

## 2017-11-23 DIAGNOSIS — I10 Essential (primary) hypertension: Secondary | ICD-10-CM | POA: Diagnosis not present

## 2017-11-23 DIAGNOSIS — E7800 Pure hypercholesterolemia, unspecified: Secondary | ICD-10-CM | POA: Diagnosis not present

## 2018-02-04 DIAGNOSIS — J441 Chronic obstructive pulmonary disease with (acute) exacerbation: Secondary | ICD-10-CM | POA: Diagnosis not present

## 2018-03-08 DIAGNOSIS — E538 Deficiency of other specified B group vitamins: Secondary | ICD-10-CM | POA: Diagnosis not present

## 2018-03-08 DIAGNOSIS — I10 Essential (primary) hypertension: Secondary | ICD-10-CM | POA: Diagnosis not present

## 2018-03-08 DIAGNOSIS — F325 Major depressive disorder, single episode, in full remission: Secondary | ICD-10-CM | POA: Diagnosis not present

## 2018-03-08 DIAGNOSIS — J431 Panlobular emphysema: Secondary | ICD-10-CM | POA: Diagnosis not present

## 2018-03-08 DIAGNOSIS — Z79899 Other long term (current) drug therapy: Secondary | ICD-10-CM | POA: Diagnosis not present

## 2018-03-08 DIAGNOSIS — E7800 Pure hypercholesterolemia, unspecified: Secondary | ICD-10-CM | POA: Diagnosis not present

## 2018-03-08 DIAGNOSIS — J449 Chronic obstructive pulmonary disease, unspecified: Secondary | ICD-10-CM | POA: Diagnosis not present

## 2018-03-14 DIAGNOSIS — J019 Acute sinusitis, unspecified: Secondary | ICD-10-CM | POA: Diagnosis not present

## 2018-03-14 DIAGNOSIS — J209 Acute bronchitis, unspecified: Secondary | ICD-10-CM | POA: Diagnosis not present

## 2018-03-14 DIAGNOSIS — J441 Chronic obstructive pulmonary disease with (acute) exacerbation: Secondary | ICD-10-CM | POA: Diagnosis not present

## 2018-03-14 DIAGNOSIS — B9689 Other specified bacterial agents as the cause of diseases classified elsewhere: Secondary | ICD-10-CM | POA: Diagnosis not present

## 2018-03-18 ENCOUNTER — Other Ambulatory Visit: Payer: Self-pay

## 2018-03-18 ENCOUNTER — Emergency Department: Payer: Medicare HMO

## 2018-03-18 ENCOUNTER — Emergency Department
Admission: EM | Admit: 2018-03-18 | Discharge: 2018-03-18 | Disposition: A | Discharge status: Home / Self Care | Payer: Medicare HMO | Source: Home / Self Care | Attending: Emergency Medicine | Admitting: Emergency Medicine

## 2018-03-18 DIAGNOSIS — R0602 Shortness of breath: Secondary | ICD-10-CM | POA: Diagnosis not present

## 2018-03-18 DIAGNOSIS — I10 Essential (primary) hypertension: Secondary | ICD-10-CM | POA: Insufficient documentation

## 2018-03-18 DIAGNOSIS — R911 Solitary pulmonary nodule: Secondary | ICD-10-CM | POA: Diagnosis present

## 2018-03-18 DIAGNOSIS — E785 Hyperlipidemia, unspecified: Secondary | ICD-10-CM | POA: Diagnosis not present

## 2018-03-18 DIAGNOSIS — Z79899 Other long term (current) drug therapy: Secondary | ICD-10-CM

## 2018-03-18 DIAGNOSIS — Z7902 Long term (current) use of antithrombotics/antiplatelets: Secondary | ICD-10-CM

## 2018-03-18 DIAGNOSIS — G629 Polyneuropathy, unspecified: Secondary | ICD-10-CM | POA: Diagnosis not present

## 2018-03-18 DIAGNOSIS — J9620 Acute and chronic respiratory failure, unspecified whether with hypoxia or hypercapnia: Secondary | ICD-10-CM | POA: Diagnosis not present

## 2018-03-18 DIAGNOSIS — J439 Emphysema, unspecified: Secondary | ICD-10-CM | POA: Diagnosis not present

## 2018-03-18 DIAGNOSIS — Z87891 Personal history of nicotine dependence: Secondary | ICD-10-CM

## 2018-03-18 DIAGNOSIS — J441 Chronic obstructive pulmonary disease with (acute) exacerbation: Secondary | ICD-10-CM | POA: Diagnosis not present

## 2018-03-18 DIAGNOSIS — F419 Anxiety disorder, unspecified: Secondary | ICD-10-CM | POA: Diagnosis present

## 2018-03-18 DIAGNOSIS — Z8673 Personal history of transient ischemic attack (TIA), and cerebral infarction without residual deficits: Secondary | ICD-10-CM | POA: Diagnosis not present

## 2018-03-18 DIAGNOSIS — K219 Gastro-esophageal reflux disease without esophagitis: Secondary | ICD-10-CM | POA: Diagnosis not present

## 2018-03-18 DIAGNOSIS — R0600 Dyspnea, unspecified: Secondary | ICD-10-CM | POA: Diagnosis not present

## 2018-03-18 DIAGNOSIS — E7800 Pure hypercholesterolemia, unspecified: Secondary | ICD-10-CM | POA: Diagnosis not present

## 2018-03-18 DIAGNOSIS — Z96641 Presence of right artificial hip joint: Secondary | ICD-10-CM

## 2018-03-18 DIAGNOSIS — Z9981 Dependence on supplemental oxygen: Secondary | ICD-10-CM | POA: Diagnosis not present

## 2018-03-18 DIAGNOSIS — M810 Age-related osteoporosis without current pathological fracture: Secondary | ICD-10-CM | POA: Diagnosis not present

## 2018-03-18 LAB — BASIC METABOLIC PANEL
ANION GAP: 10 (ref 5–15)
BUN: 13 mg/dL (ref 8–23)
CO2: 25 mmol/L (ref 22–32)
Calcium: 8.7 mg/dL — ABNORMAL LOW (ref 8.9–10.3)
Chloride: 99 mmol/L (ref 98–111)
Creatinine, Ser: 1.24 mg/dL — ABNORMAL HIGH (ref 0.44–1.00)
GFR calc Af Amer: 47 mL/min — ABNORMAL LOW (ref >60)
GFR, EST NON AFRICAN AMERICAN: 40 mL/min — AB (ref >60)
Glucose, Bld: 118 mg/dL — ABNORMAL HIGH (ref 70–99)
POTASSIUM: 4.7 mmol/L (ref 3.5–5.1)
Sodium: 134 mmol/L — ABNORMAL LOW (ref 135–145)

## 2018-03-18 LAB — CBC
HEMATOCRIT: 36.4 % (ref 35.0–47.0)
HEMOGLOBIN: 12.5 g/dL (ref 12.0–16.0)
MCH: 29.4 pg (ref 26.0–34.0)
MCHC: 34.3 g/dL (ref 32.0–36.0)
MCV: 85.7 fL (ref 80.0–100.0)
Platelets: 220 10*3/uL (ref 150–440)
RBC: 4.25 MIL/uL (ref 3.80–5.20)
RDW: 14.7 % — ABNORMAL HIGH (ref 11.5–14.5)
WBC: 8.7 10*3/uL (ref 3.6–11.0)

## 2018-03-18 LAB — TROPONIN I: Troponin I: <0.03 ng/mL (ref <0.03)

## 2018-03-18 MED ORDER — IPRATROPIUM-ALBUTEROL 0.5-2.5 (3) MG/3ML IN SOLN
3.0000 mL | Freq: Once | RESPIRATORY_TRACT | Status: AC
  Administered 2018-03-18: 3 mL via RESPIRATORY_TRACT
  Filled 2018-03-18: qty 3

## 2018-03-18 NOTE — ED Provider Notes (Signed)
[facility] Emergency Department Provider Note  Time seen: 2:10 PM  I have reviewed the triage vital signs and the nursing notes.   HISTORY  Chief Complaint Shortness of Breath and Weakness    HPI Donna Quinn is a 82 y.o. female with a past medical history of a AAA, COPD, gastric reflux, hypertension, hyperlipidemia, CVA, presents to the emergency department for difficulty breathing.  According to the patient for the past nearly 2 weeks she has had worsening trouble breathing with occasional cough.  Saw her primary care doctor approximately 1 week ago who wrote her for Zithromax and prednisone.  She states she saw him several days ago because she was not any better so he called her and Levaquin.  Patient states she is continued to be short of breath and is not been improving so she came to the emergency department today.  Patient states occasional cough with very mild sputum production.  Denies any vomiting or abdominal pain.  Denies any chest pain.  Denies leg pain or swelling.  Denies fever.   Past Medical History:  Diagnosis Date  . AAA (abdominal aortic aneurysm) (HCC)   . AAA (abdominal aortic aneurysm) (HCC)   . Abdominal aortic aneurysm (AAA) (HCC)   . Brain aneurysm   . Collagen vascular disease (HCC)   . COPD (chronic obstructive pulmonary disease) (HCC)   . Dyspnea   . GERD (gastroesophageal reflux disease)   . H/O wheezing   . Hypercholesterolemia   . Hypertension   . Neuropathy (HCC)   . Osteoporosis   . Stroke [facility]) 2016   twice    Patient Active Problem List   Diagnosis Date Noted  . Fracture of femoral neck, right (HCC) 10/19/2016  . HTN (hypertension) 10/19/2016  . HLD (hyperlipidemia) 10/19/2016  . COPD with acute bronchitis (HCC) 10/19/2016  . GERD (gastroesophageal reflux disease) 10/19/2016  . COPD exacerbation (HCC) 08/06/2016    Past Surgical History:  Procedure Laterality Date  . abdominal Bilateral    stents  . BACK  SURGERY     cyst removed from back  . BRAIN SURGERY     Brain aneurysm repair, coiling  . CAROTID ENDARTERECTOMY    . CATARACT EXTRACTION W/PHACO Left 07/08/2016   Procedure: CATARACT EXTRACTION PHACO AND INTRAOCULAR LENS PLACEMENT (IOC);  Surgeon: Nevada Crane, MD;  Location: [facility] ORS;  Service: Ophthalmology;  Laterality: Left;  [place]  01:30 AP% 39.1 CDE 20.3 Fluid pack lot # [id number]  . CATARACT EXTRACTION W/PHACO Right 09/16/2016   Procedure: CATARACT EXTRACTION PHACO AND INTRAOCULAR LENS PLACEMENT (IOC);  Surgeon: Nevada Crane, MD;  Location: [facility] ORS;  Service: Ophthalmology;  Laterality: Right;  Lot # [id number] H [place]: 01"21.7 AP% 16.8: CDE: 13.90  . HIP ARTHROPLASTY Right 10/20/2016   Procedure: ARTHROPLASTY BIPOLAR HIP (HEMIARTHROPLASTY);  Surgeon: Christena Flake, MD;  Location: [facility] ORS;  Service: Orthopedics;  Laterality: Right;    Prior to Admission medications   Medication Sig Start Date End Date Taking? Authorizing Provider  acetaminophen (TYLENOL) 500 MG tablet Take 1 tablet (500 mg total) by mouth every 6 (six) hours as needed for moderate pain or headache. 10/22/16   Gouru, Deanna Artis, MD  albuterol (PROVENTIL HFA;VENTOLIN HFA) 108 (90 Base) MCG/ACT inhaler Inhale 2 puffs into the lungs every 6 (six) hours as needed for wheezing or shortness of breath.    [provider]  atorvastatin (LIPITOR) 40 MG tablet Take 40 mg by mouth every evening.     [provider]  budesonide-formoterol (SYMBICORT) 160-4.5 MCG/ACT inhaler Inhale 2 puffs into the lungs 2 (two) times daily.    [provider]  Cholecalciferol 2000 units CAPS Take 2,000 Units by mouth every morning.     [provider]  clopidogrel (PLAVIX) 75 MG tablet Take 75 mg by mouth every morning.    [provider]  diphenhydramine-acetaminophen (TYLENOL PM) 25-500 MG TABS tablet Take 2 tablets by mouth at bedtime as needed (sleep).    [provider]  docusate sodium  (COLACE) 100 MG capsule Take 1 capsule (100 mg total) by mouth 2 (two) times daily as needed for mild constipation. 10/22/16   Gouru, Deanna ArtisAruna, MD  enoxaparin (LOVENOX) 40 MG/0.4ML injection Inject 0.4 mLs (40 mg total) into the skin daily. 10/22/16 11/05/16  Ramonita LabGouru, Aruna, MD  ferrous sulfate 325 (65 FE) MG tablet Take 1 tablet (325 mg total) by mouth daily with breakfast. 10/23/16   Gouru, Deanna ArtisAruna, MD  gabapentin (NEURONTIN) 300 MG capsule Take 300 mg by mouth 2 (two) times daily.    [provider]  hydrALAZINE (APRESOLINE) 25 MG tablet Take 25 mg by mouth 2 (two) times daily.     [provider]  losartan (COZAAR) 50 MG tablet Take 50 mg by mouth daily.     [provider]  omeprazole (PRILOSEC) 20 MG capsule Take 20 mg by mouth 2 (two) times daily before a meal.    [provider]  ondansetron (ZOFRAN) 4 MG tablet Take 1 tablet (4 mg total) by mouth every 6 (six) hours as needed for nausea. 10/22/16   Gouru, Deanna ArtisAruna, MD  oxyCODONE (OXY IR/ROXICODONE) 5 MG immediate release tablet Take 1 tablet (5 mg total) by mouth every 4 (four) hours as needed (for pain score of 1-4). 10/22/16   Gouru, Deanna ArtisAruna, MD  polyethylene glycol (MIRALAX / GLYCOLAX) packet Take 17 g by mouth daily as needed for mild constipation. Patient not taking: Reported on 10/19/2016 08/08/16   Ramonita LabGouru, Aruna, MD  vitamin B-12 (CYANOCOBALAMIN) 1000 MCG tablet Take 1,000 mcg by mouth daily.    [provider]    Allergies  Allergen Reactions  . Aggrenox [Aspirin-Dipyridamole Er]     A really bad headache  . Augmentin [Amoxicillin-Pot Clavulanate]     unknown  . Penicillins Other (See Comments)    Has patient had a PCN reaction causing immediate rash, facial/tongue/throat swelling, SOB or lightheadedness with hypotension: Yes Has patient had a PCN reaction causing severe rash involving mucus membranes or skin necrosis: Yes Has patient had a PCN reaction that required hospitalization No Has patient had  a PCN reaction occurring within the last 10 years: No If all of the above answers are "NO", then may proceed with Cephalosporin use.  . Remeron [Mirtazapine]     Broke out inside the mouth  . Sulfa Antibiotics     Broke out in welps and had to be hospitalized    Family History  Problem Relation Age of Onset  . Breast cancer Sister 3662    Social History Social History   Tobacco Use  . Smoking status: Former Games developermoker  . Smokeless tobacco: Never Used  Substance Use Topics  . Alcohol use: No  . Drug use: No    Review of Systems Constitutional: Negative for fever. Eyes: Negative for visual complaints ENT: Mild congestion. Cardiovascular: Negative for chest pain. Respiratory: Positive for shortness of breath occasional cough with occasional sputum production Gastrointestinal: Negative for abdominal pain, vomiting and diarrhea. Genitourinary: Negative for  urinary compaints Musculoskeletal: Negative for musculoskeletal complaints Skin: Negative for skin complaints  Neurological: Negative for headache All other ROS negative  ____________________________________________   PHYSICAL EXAM:  VITAL SIGNS: ED Triage Vitals  Enc Vitals Group     BP 03/18/18 1234 (!) 187/76     Pulse Rate 03/18/18 1234 83     Resp 03/18/18 1234 (!) 28     Temp 03/18/18 1234 98.7 F (37.1 C)     Temp Source 03/18/18 1234 Oral     SpO2 03/18/18 1234 94 %     Weight 03/18/18 1235 137 lb (62.1 kg)     Height 03/18/18 1235 5\' 6"  (1.676 m)     Head Circumference --      Peak Flow --      Pain Score 03/18/18 1234 0     Pain Loc --      Pain Edu? --      Excl. in GC? --    Constitutional: Alert and oriented. Well appearing and in no distress. Eyes: Normal exam ENT   Head: Normocephalic and atraumatic.   Mouth/Throat: Mucous membranes are moist. Cardiovascular: Normal rate, regular rhythm. No murmur Respiratory: Patient has diminished breath sounds bilaterally with slight expiratory wheeze  as well.  No obvious rales or rhonchi. Gastrointestinal: Soft and nontender. No distention.   Musculoskeletal: Nontender with normal range of motion in all extremities. No lower extremity tenderness or edema. Neurologic:  Normal speech and language. No gross focal neurologic deficits are appreciated. Skin:  Skin is warm, dry and intact.  Psychiatric: Mood and affect are normal. Speech and behavior are normal.   ____________________________________________    EKG  EKG reviewed and interpreted by myself shows sinus rhythm at 76 bpm with a narrow QRS, left axis deviation, largely normal intervals with nonspecific ST changes without ST elevation.  ____________________________________________    RADIOLOGY  Chest x-ray negative for acute abnormality  ____________________________________________   INITIAL IMPRESSION / ASSESSMENT AND PLAN / ED COURSE  Pertinent labs & imaging results that were available during my care of the patient were reviewed by me and considered in my medical decision making (see chart for details).  Patient presents to the emergency department for shortness of breath ongoing for nearly 2 weeks but progressively worsening despite the use of oral steroids and antibiotics.  Differential would include pneumonia, ACS, CHF, COPD exacerbation, asthma, upper respiratory infection, bronchitis.  We will check labs, chest x-ray, treat with duo nebs in the emergency department and continue to closely monitor.  Patient's chest x-ray is clear, labs are largely at baseline, I have added on a cardiac panel to the patient's blood work.  Patient's work-up is largely negative.  Patient satting 96% currently on room air.  Ambulated the patient and she continued to sat 93 to 96% during ambulation.  States she is feeling better after breathing treatments.  Discussed options, patient will be discharged home with PCP follow-up on Monday.  I discussed with the patient if she worsens at all to  return to the emergency department for evaluation and possible admission.  ____________________________________________   FINAL CLINICAL IMPRESSION(S) / ED DIAGNOSES  Dyspnea COPD exacerbation   Minna Antis, MD 03/18/18 1706

## 2018-03-18 NOTE — ED Triage Notes (Signed)
Pt arrived via POV from home with reports of shortness of breath and recently dx with sinusitis. Pt was on levaquin but states she didn't take it because it made her sick.  Pt was given steroid shot on Wednesday as well when dx with sinusitis.   Pt states she has COPD and is normally short of breath, states she does not seem to be more short of breath than usual.  Pt does not wear oxygen.  Pt also states she has had 2 episodes of diarrhea.  Pt also reports weakness.

## 2018-03-18 NOTE — ED Notes (Signed)
Pt unable to urinate in triage for urine sample

## 2018-03-18 NOTE — ED Notes (Signed)
Pt given [place] tray and coke.

## 2018-03-18 NOTE — ED Notes (Signed)
Pt ambulated with tech [place] with no drop in oxygen saturation. Pt's oxygen remained above 93%.

## 2018-03-21 ENCOUNTER — Emergency Department: Payer: Medicare HMO

## 2018-03-21 ENCOUNTER — Other Ambulatory Visit: Payer: Self-pay

## 2018-03-21 ENCOUNTER — Inpatient Hospital Stay
Admission: EM | Admit: 2018-03-21 | Discharge: 2018-03-23 | DRG: 190 | Disposition: A | Discharge status: Home Health | Payer: Medicare HMO | Attending: Specialist | Admitting: Specialist

## 2018-03-21 ENCOUNTER — Inpatient Hospital Stay: Payer: Medicare HMO

## 2018-03-21 DIAGNOSIS — G629 Polyneuropathy, unspecified: Secondary | ICD-10-CM | POA: Diagnosis present

## 2018-03-21 DIAGNOSIS — J441 Chronic obstructive pulmonary disease with (acute) exacerbation: Principal | ICD-10-CM | POA: Diagnosis present

## 2018-03-21 DIAGNOSIS — Z9981 Dependence on supplemental oxygen: Secondary | ICD-10-CM

## 2018-03-21 DIAGNOSIS — E7800 Pure hypercholesterolemia, unspecified: Secondary | ICD-10-CM | POA: Diagnosis present

## 2018-03-21 DIAGNOSIS — I10 Essential (primary) hypertension: Secondary | ICD-10-CM | POA: Diagnosis present

## 2018-03-21 DIAGNOSIS — J9620 Acute and chronic respiratory failure, unspecified whether with hypoxia or hypercapnia: Secondary | ICD-10-CM | POA: Diagnosis present

## 2018-03-21 DIAGNOSIS — K219 Gastro-esophageal reflux disease without esophagitis: Secondary | ICD-10-CM | POA: Diagnosis present

## 2018-03-21 DIAGNOSIS — M810 Age-related osteoporosis without current pathological fracture: Secondary | ICD-10-CM | POA: Diagnosis present

## 2018-03-21 DIAGNOSIS — F419 Anxiety disorder, unspecified: Secondary | ICD-10-CM | POA: Diagnosis present

## 2018-03-21 DIAGNOSIS — Z87891 Personal history of nicotine dependence: Secondary | ICD-10-CM

## 2018-03-21 DIAGNOSIS — Z8673 Personal history of transient ischemic attack (TIA), and cerebral infarction without residual deficits: Secondary | ICD-10-CM | POA: Diagnosis not present

## 2018-03-21 DIAGNOSIS — R911 Solitary pulmonary nodule: Secondary | ICD-10-CM | POA: Diagnosis present

## 2018-03-21 DIAGNOSIS — E785 Hyperlipidemia, unspecified: Secondary | ICD-10-CM | POA: Diagnosis present

## 2018-03-21 DIAGNOSIS — Z7902 Long term (current) use of antithrombotics/antiplatelets: Secondary | ICD-10-CM

## 2018-03-21 LAB — BASIC METABOLIC PANEL
ANION GAP: 7 (ref 5–15)
BUN: 14 mg/dL (ref 8–23)
CHLORIDE: 97 mmol/L — AB (ref 98–111)
CO2: 26 mmol/L (ref 22–32)
Calcium: 9 mg/dL (ref 8.9–10.3)
Creatinine, Ser: 1.28 mg/dL — ABNORMAL HIGH (ref 0.44–1.00)
GFR calc Af Amer: 45 mL/min — ABNORMAL LOW (ref >60)
GFR calc non Af Amer: 39 mL/min — ABNORMAL LOW (ref >60)
Glucose, Bld: 107 mg/dL — ABNORMAL HIGH (ref 70–99)
Potassium: 4.3 mmol/L (ref 3.5–5.1)
SODIUM: 130 mmol/L — AB (ref 135–145)

## 2018-03-21 LAB — CBC
HCT: 35.5 % (ref 35.0–47.0)
HEMOGLOBIN: 12.2 g/dL (ref 12.0–16.0)
MCH: 29.5 pg (ref 26.0–34.0)
MCHC: 34.6 g/dL (ref 32.0–36.0)
MCV: 85.3 fL (ref 80.0–100.0)
Platelets: 193 10*3/uL (ref 150–440)
RBC: 4.16 MIL/uL (ref 3.80–5.20)
RDW: 14.4 % (ref 11.5–14.5)
WBC: 6.7 10*3/uL (ref 3.6–11.0)

## 2018-03-21 LAB — TROPONIN I

## 2018-03-21 MED ORDER — METOPROLOL SUCCINATE ER 25 MG PO TB24
25.0000 mg | ORAL_TABLET | Freq: Every day | ORAL | Status: DC
  Administered 2018-03-22 – 2018-03-23 (×2): 25 mg via ORAL
  Filled 2018-03-21 (×2): qty 1

## 2018-03-21 MED ORDER — HYDRALAZINE HCL 25 MG PO TABS
25.0000 mg | ORAL_TABLET | Freq: Three times a day (TID) | ORAL | Status: DC
  Administered 2018-03-21 – 2018-03-23 (×5): 25 mg via ORAL
  Filled 2018-03-21 (×5): qty 1

## 2018-03-21 MED ORDER — ALPRAZOLAM 0.25 MG PO TABS
0.2500 mg | ORAL_TABLET | Freq: Every day | ORAL | Status: DC
  Administered 2018-03-22: 0.25 mg via ORAL
  Filled 2018-03-21 (×2): qty 1

## 2018-03-21 MED ORDER — PANTOPRAZOLE SODIUM 40 MG PO TBEC
40.0000 mg | DELAYED_RELEASE_TABLET | Freq: Every day | ORAL | Status: DC
  Administered 2018-03-22 – 2018-03-23 (×2): 40 mg via ORAL
  Filled 2018-03-21 (×2): qty 1

## 2018-03-21 MED ORDER — METHYLPREDNISOLONE SODIUM SUCC 125 MG IJ SOLR
125.0000 mg | Freq: Once | INTRAMUSCULAR | Status: AC
Start: 2018-03-21 — End: 2018-03-21
  Administered 2018-03-21: 125 mg via INTRAVENOUS
  Filled 2018-03-21: qty 2

## 2018-03-21 MED ORDER — ONDANSETRON HCL 4 MG PO TABS: 4.0000 mg | ORAL_TABLET | Freq: Four times a day (QID) | ORAL | Status: DC | PRN

## 2018-03-21 MED ORDER — ATORVASTATIN CALCIUM 20 MG PO TABS
40.0000 mg | ORAL_TABLET | Freq: Every evening | ORAL | Status: DC
  Administered 2018-03-21 – 2018-03-22 (×2): 40 mg via ORAL
  Filled 2018-03-21 (×2): qty 2

## 2018-03-21 MED ORDER — IPRATROPIUM-ALBUTEROL 0.5-2.5 (3) MG/3ML IN SOLN
3.0000 mL | Freq: Once | RESPIRATORY_TRACT | Status: AC
  Administered 2018-03-21: 3 mL via RESPIRATORY_TRACT
  Filled 2018-03-21: qty 3

## 2018-03-21 MED ORDER — IPRATROPIUM-ALBUTEROL 0.5-2.5 (3) MG/3ML IN SOLN
3.0000 mL | Freq: Four times a day (QID) | RESPIRATORY_TRACT | Status: DC
  Administered 2018-03-21 – 2018-03-22 (×5): 3 mL via RESPIRATORY_TRACT
  Filled 2018-03-21 (×6): qty 3

## 2018-03-21 MED ORDER — VITAMIN D (ERGOCALCIFEROL) 1.25 MG (50000 UNIT) PO CAPS: 50000.0000 [IU] | ORAL_CAPSULE | ORAL | Status: DC

## 2018-03-21 MED ORDER — VITAMIN B-12 1000 MCG PO TABS
1000.0000 ug | ORAL_TABLET | Freq: Every day | ORAL | Status: DC
  Administered 2018-03-22: 1000 ug via ORAL
  Filled 2018-03-21: qty 1

## 2018-03-21 MED ORDER — METHYLPREDNISOLONE SODIUM SUCC 40 MG IJ SOLR
40.0000 mg | Freq: Four times a day (QID) | INTRAMUSCULAR | Status: DC
  Administered 2018-03-21 – 2018-03-23 (×7): 40 mg via INTRAVENOUS
  Filled 2018-03-21 (×7): qty 1

## 2018-03-21 MED ORDER — CLOPIDOGREL BISULFATE 75 MG PO TABS
75.0000 mg | ORAL_TABLET | ORAL | Status: DC
  Administered 2018-03-22 – 2018-03-23 (×2): 75 mg via ORAL
  Filled 2018-03-21 (×2): qty 1

## 2018-03-21 MED ORDER — ACETAMINOPHEN 325 MG PO TABS: 650.0000 mg | ORAL_TABLET | Freq: Four times a day (QID) | ORAL | Status: DC | PRN

## 2018-03-21 MED ORDER — BUDESONIDE 0.5 MG/2ML IN SUSP
0.5000 mg | Freq: Two times a day (BID) | RESPIRATORY_TRACT | Status: DC
  Administered 2018-03-21 – 2018-03-23 (×4): 0.5 mg via RESPIRATORY_TRACT
  Filled 2018-03-21 (×4): qty 2

## 2018-03-21 MED ORDER — LOSARTAN POTASSIUM 50 MG PO TABS
50.0000 mg | ORAL_TABLET | Freq: Two times a day (BID) | ORAL | Status: DC
  Administered 2018-03-21 – 2018-03-23 (×4): 50 mg via ORAL
  Filled 2018-03-21 (×4): qty 1

## 2018-03-21 MED ORDER — LEVOFLOXACIN 250 MG PO TABS
250.0000 mg | ORAL_TABLET | Freq: Every day | ORAL | Status: DC
Start: 2018-03-22 — End: 2018-03-23
  Administered 2018-03-22 – 2018-03-23 (×2): 250 mg via ORAL
  Filled 2018-03-21 (×2): qty 1

## 2018-03-21 MED ORDER — ACETAMINOPHEN 650 MG RE SUPP: 650.0000 mg | Freq: Four times a day (QID) | RECTAL | Status: DC | PRN

## 2018-03-21 MED ORDER — ALBUTEROL SULFATE (2.5 MG/3ML) 0.083% IN NEBU
5.0000 mg | INHALATION_SOLUTION | Freq: Once | RESPIRATORY_TRACT | Status: AC
  Administered 2018-03-21: 5 mg via RESPIRATORY_TRACT
  Filled 2018-03-21: qty 6

## 2018-03-21 MED ORDER — ENOXAPARIN SODIUM 40 MG/0.4ML ~~LOC~~ SOLN
40.0000 mg | SUBCUTANEOUS | Status: DC
  Administered 2018-03-21 – 2018-03-22 (×2): 40 mg via SUBCUTANEOUS
  Filled 2018-03-21 (×2): qty 0.4

## 2018-03-21 MED ORDER — GABAPENTIN 300 MG PO CAPS
300.0000 mg | ORAL_CAPSULE | Freq: Two times a day (BID) | ORAL | Status: DC
  Administered 2018-03-21 – 2018-03-23 (×4): 300 mg via ORAL
  Filled 2018-03-21 (×4): qty 1

## 2018-03-21 MED ORDER — ONDANSETRON HCL 4 MG/2ML IJ SOLN: 4.0000 mg | Freq: Four times a day (QID) | INTRAMUSCULAR | Status: DC | PRN

## 2018-03-21 MED ORDER — BENZONATATE 100 MG PO CAPS: 200.0000 mg | ORAL_CAPSULE | Freq: Three times a day (TID) | ORAL | Status: DC | PRN

## 2018-03-21 MED ORDER — VITAMIN D 1000 UNITS PO TABS
2000.0000 [IU] | ORAL_TABLET | ORAL | Status: DC
  Administered 2018-03-22 – 2018-03-23 (×2): 2000 [IU] via ORAL
  Filled 2018-03-21 (×2): qty 2

## 2018-03-21 NOTE — Progress Notes (Signed)
PHARMACY NOTE -  ANTIBIOTIC RENAL DOSE ADJUSTMENT   Pharmacy to assist with antibiotic renal dose adjustment.   Patient has been initiated on Levofloxacin 500mg  for COPD exacerbation.  SCr 1.28, estimated CrCl 33.4 ml/min  Current dosage is not appropriate based on current renal function.   Pharmacy has adjusted the dose per protocol to levofloxacin 250mg  every 24 hours for CrCl <5850mL/min.    Gardner CandleSheema M Sanchez Hemmer, PharmD, BCPS Clinical Pharmacist 03/21/2018 8:06 PM

## 2018-03-21 NOTE — ED Provider Notes (Signed)
[facility] Emergency Department Provider Note   ____________________________________________    I have reviewed the triage vital signs and the nursing notes.   HISTORY  Chief Complaint Shortness of Breath     HPI Donna Quinn is a 82 y.o. female with a history of COPD who presents with complaints of shortness of breath.  Patient reports seen in the emergency department several days ago had initially been feeling better but yesterday started feeling short of breath again.  She describes chest tightness and increased work of breathing.  She is on oxygen at home.  Denies fevers or chills.  Positive cough, nonproductive.  No leg swelling or pain.   Past Medical History:  Diagnosis Date  . AAA (abdominal aortic aneurysm) (HCC)   . AAA (abdominal aortic aneurysm) (HCC)   . Abdominal aortic aneurysm (AAA) (HCC)   . Brain aneurysm   . Collagen vascular disease (HCC)   . COPD (chronic obstructive pulmonary disease) (HCC)   . Dyspnea   . GERD (gastroesophageal reflux disease)   . H/O wheezing   . Hypercholesterolemia   . Hypertension   . Neuropathy   . Osteoporosis   . Stroke [facility]) 2016   twice    Patient Active Problem List   Diagnosis Date Noted  . Fracture of femoral neck, right (HCC) 10/19/2016  . HTN (hypertension) 10/19/2016  . HLD (hyperlipidemia) 10/19/2016  . COPD with acute bronchitis (HCC) 10/19/2016  . GERD (gastroesophageal reflux disease) 10/19/2016  . COPD exacerbation (HCC) 08/06/2016    Past Surgical History:  Procedure Laterality Date  . abdominal Bilateral    stents  . BACK SURGERY     cyst removed from back  . BRAIN SURGERY     Brain aneurysm repair, coiling  . CAROTID ENDARTERECTOMY    . CATARACT EXTRACTION W/PHACO Left 07/08/2016   Procedure: CATARACT EXTRACTION PHACO AND INTRAOCULAR LENS PLACEMENT (IOC);  Surgeon: Nevada CraneBradley Mark King, MD;  Location: [facility] ORS;  Service: Ophthalmology;  Laterality: Left;  [place]   01:30 AP% 39.1 CDE 20.3 Fluid pack lot # [id number]  . CATARACT EXTRACTION W/PHACO Right 09/16/2016   Procedure: CATARACT EXTRACTION PHACO AND INTRAOCULAR LENS PLACEMENT (IOC);  Surgeon: Nevada CraneBradley Mark King, MD;  Location: [facility] ORS;  Service: Ophthalmology;  Laterality: Right;  Lot # [id number] H [place]: 01"21.7 AP% 16.8: CDE: 13.90  . HIP ARTHROPLASTY Right 10/20/2016   Procedure: ARTHROPLASTY BIPOLAR HIP (HEMIARTHROPLASTY);  Surgeon: Christena FlakeJohn J Poggi, MD;  Location: [facility] ORS;  Service: Orthopedics;  Laterality: Right;    Prior to Admission medications   Medication Sig Start Date End Date Taking? Authorizing Provider  acetaminophen (TYLENOL) 500 MG tablet Take 1 tablet (500 mg total) by mouth every 6 (six) hours as needed for moderate pain or headache. 10/22/16  Yes Gouru, Aruna, MD  albuterol (PROVENTIL HFA;VENTOLIN HFA) 108 (90 Base) MCG/ACT inhaler Inhale 2 puffs into the lungs every 6 (six) hours as needed for wheezing or shortness of breath.   Yes [provider]  ALPRAZolam (XANAX) 0.25 MG tablet Take 0.25 mg by mouth at bedtime.    Yes [provider]  atorvastatin (LIPITOR) 40 MG tablet Take 40 mg by mouth every evening.    Yes [provider]  benzonatate (TESSALON) 200 MG capsule Take 1 capsule by mouth 3 (three) times daily as needed for cough.    Yes [provider]  budesonide-formoterol (SYMBICORT) 160-4.5 MCG/ACT inhaler Inhale 2 puffs into the lungs 2 (two) times daily.   Yes  [provider]  Cholecalciferol 2000 units CAPS Take 2,000 Units by mouth every morning.    Yes [provider]  clopidogrel (PLAVIX) 75 MG tablet Take 75 mg by mouth every morning.   Yes [provider]  gabapentin (NEURONTIN) 300 MG capsule Take 300 mg by mouth 2 (two) times daily.   Yes [provider]  hydrALAZINE (APRESOLINE) 25 MG tablet Take 25 mg by mouth 3 (three) times daily.    Yes [provider]  levofloxacin (LEVAQUIN) 500 MG  tablet Take 500 mg by mouth daily.  03/14/18 03/24/18 Yes [provider]  losartan (COZAAR) 50 MG tablet Take 50 mg by mouth 2 (two) times daily.    Yes [provider]  metoprolol succinate (TOPROL-XL) 25 MG 24 hr tablet Take 25 mg by mouth daily.    Yes [provider]  omeprazole (PRILOSEC) 20 MG capsule Take 20 mg by mouth 2 (two) times daily before a meal.   Yes [provider]  vitamin B-12 (CYANOCOBALAMIN) 1000 MCG tablet Take 1,000 mcg by mouth daily.   Yes [provider]  Vitamin D, Ergocalciferol, (DRISDOL) 50000 units CAPS capsule Take 1 capsule by mouth once a week. 02/28/18  Yes [provider]     Allergies Aggrenox [aspirin-dipyridamole er]; Augmentin [amoxicillin-pot clavulanate]; Penicillins; Remeron [mirtazapine]; and Sulfa antibiotics  Family History  Problem Relation Age of Onset  . Breast cancer Sister [id number]  . Kidney disease Father     Social History Social History   Tobacco Use  . Smoking status: Former Smoker    Packs/day: 1.00    Years: 40.00    Pack years: 40.00    Types: Cigarettes  . Smokeless tobacco: Never Used  Substance Use Topics  . Alcohol use: No  . Drug use: No    Review of Systems  Constitutional: No fever/chills Eyes: No visual changes.  ENT: No sore throat. Cardiovascular: As tightness as above Respiratory: As above Gastrointestinal: No abdominal pain.  No nausea, no vomiting.   Genitourinary: Negative for dysuria. Musculoskeletal: Negative for back pain. Skin: Negative for rash. Neurological: Negative for headaches   ____________________________________________   PHYSICAL EXAM:  VITAL SIGNS: ED Triage Vitals  Enc Vitals Group     BP 03/21/18 1431 (!) 168/70     Pulse Rate 03/21/18 1430 77     Resp --      Temp 03/21/18 1430 97.8 F (36.6 C)     Temp Source 03/21/18 1430 Oral     SpO2 03/21/18 1430 96 %     Weight 03/21/18 1431 62.1 kg (137 lb)     Height 03/21/18 1431  1.676 m (5\' 6" )     Head Circumference --      Peak Flow --      Pain Score 03/21/18 1431 0     Pain Loc --      Pain Edu? --      Excl. in GC? --     Constitutional: Alert and oriented. No acute distress. Pleasant and interactive Eyes: Conjunctivae are normal.   Nose: No congestion/rhinnorhea. Mouth/Throat: Mucous membranes are moist.    Cardiovascular: Normal rate, regular rhythm. Grossly normal heart sounds.  Good peripheral circulation. Respiratory: Increased respiratory effort with mild tachypnea, diffuse wheezing, poor air movement. Gastrointestinal: Soft and nontender. No distention.    Musculoskeletal: No lower extremity tenderness nor edema.  Warm and well perfused Neurologic:  Normal speech and language. No gross focal neurologic deficits are appreciated.  Skin:  Skin is warm, dry and intact. No rash noted. Psychiatric: Mood and affect are normal. Speech and behavior are normal.  ____________________________________________   LABS (all labs ordered are listed, but only abnormal results are displayed)  Labs Reviewed  BASIC METABOLIC PANEL - Abnormal; Notable for the following components:      Result Value   Sodium 130 (*)    Chloride 97 (*)    Glucose, Bld 107 (*)    Creatinine, Ser 1.28 (*)    GFR calc non Af Amer 39 (*)    GFR calc Af Amer 45 (*)    All other components within normal limits  CBC  TROPONIN I   ____________________________________________  EKG  None ____________________________________________  RADIOLOGY  Chest x-ray no acute distress ____________________________________________   PROCEDURES  Procedure(s) performed: No  Procedures   Critical Care performed: No ____________________________________________   INITIAL IMPRESSION / ASSESSMENT AND PLAN / ED COURSE  Pertinent labs & imaging results that were available during my care of the patient were reviewed by me and considered in my medical decision making (see chart for  details).  She presents with shortness of breath with a history of COPD.  Poor air movement on exam, scattered wheezing consistent with COPD exacerbation.  We will give IV Solu-Medrol, duo nebs, anticipate admission  Minimal improvement after treatment, discussed with hospitalist for admission    ____________________________________________   FINAL CLINICAL IMPRESSION(S) / ED DIAGNOSES  Final diagnoses:  COPD exacerbation (HCC)        Note:  This document was prepared using Dragon voice recognition software and may include unintentional dictation errors.    Jene Every, MD 03/21/18 [phone]

## 2018-03-21 NOTE — ED Triage Notes (Signed)
Pt c/o of hot flashes with symptoms and weakness.

## 2018-03-21 NOTE — Progress Notes (Signed)
PHARMACY NOTE -  ANTIBIOTIC RENAL DOSE ADJUSTMENT   Pharmacy to assist with antibiotic renal dose adjustment.   Patient has been initiated on levofloxacin 500mg  for COPD exacerbation.  SCr 1.28, estimated CrCl 33.4 ml/min  Current dosage is not appropriate based on current CrCl <1050ml/min.   Pharmacy has adjusted the dose to Levofloxacin 250mg  every 24 hours.   Gardner CandleSheema M Myron Lona, PharmD, BCPS Clinical Pharmacist 03/21/2018 10:26 PM

## 2018-03-21 NOTE — ED Notes (Signed)
Christina RN, aware of bed assigned  

## 2018-03-21 NOTE — ED Triage Notes (Signed)
Pt to ED c/o difficulty breathing. Pt had prednisone shot 8/6 with same symptoms. Pt hx of COPD.

## 2018-03-21 NOTE — H&P (Signed)
[employer] [facility] [place] [facility]   PATIENT NAME: Donna Quinn    MR#:  [id number]  DATE OF BIRTH:  07/15/1936  DATE OF ADMISSION:  03/21/2018  PRIMARY CARE PHYSICIAN: Donna Quinn   REQUESTING/REFERRING PHYSICIAN: Dr.   Antony ContrasHIEF COMPLAINT:   Chief Complaint  Patient presents with  . Shortness of Breath    HISTORY OF PRESENT ILLNESS:  Donna Quinn  is a 82 y.o. female with a known history of COPD, chronic respiratory failure, hypertension, hyperlipidemia, GERD, history of previous CVA, neuropathy, osteoporosis, history of abdominal aortic aneurysm who presents to the hospital due to shortness of breath and hot flashes.  Patient says she developed worsening shortness of breath over the past 2 weeks with some upper respiratory infection and has finished 2 courses of prednisone and antibiotics and has not improved and therefore came to the ER for further evaluation.  Patient has finished a short course of prednisone and Zithromax and recently received a steroid shot and also oral Levaquin and continues to have significant shortness of breath and complains of feeling extremely hot although has been afebrile.  She presents to the ER and was noted to be in respiratory distress secondary to COPD exacerbation.  Hospitalist services were contacted for admission.  Patient is although not hypoxic as her O2 sats is stable on room air.  PAST MEDICAL HISTORY:   Past Medical History:  Diagnosis Date  . AAA (abdominal aortic aneurysm) (HCC)   . AAA (abdominal aortic aneurysm) (HCC)   . Abdominal aortic aneurysm (AAA) (HCC)   . Brain aneurysm   . Collagen vascular disease (HCC)   . COPD (chronic obstructive pulmonary disease) (HCC)   . Dyspnea   . GERD (gastroesophageal reflux disease)   . H/O wheezing   . Hypercholesterolemia   . Hypertension   . Neuropathy   . Osteoporosis   . Stroke [facility](HCC) 2016   twice    PAST SURGICAL HISTORY:   Past Surgical History:   Procedure Laterality Date  . abdominal Bilateral    stents  . BACK SURGERY     cyst removed from back  . BRAIN SURGERY     Brain aneurysm repair, coiling  . CAROTID ENDARTERECTOMY    . CATARACT EXTRACTION W/PHACO Left 07/08/2016   Procedure: CATARACT EXTRACTION PHACO AND INTRAOCULAR LENS PLACEMENT (IOC);  Surgeon: Nevada CraneBradley Mark King, Quinn;  Location: [facility] ORS;  Service: Ophthalmology;  Laterality: Left;  US  01:30 AP% 39.1 CDE 20.3 Fluid pack lot # [id number]  . CATARACT EXTRACTION W/PHACO Right 09/16/2016   Procedure: CATARACT EXTRACTION PHACO AND INTRAOCULAR LENS PLACEMENT (IOC);  Surgeon: Nevada CraneBradley Mark King, Quinn;  Location: [facility] ORS;  Service: Ophthalmology;  Laterality: Right;  Lot # [id number] H US: 01"21.7 AP% 16.8: CDE: 13.90  . HIP ARTHROPLASTY Right 10/20/2016   Procedure: ARTHROPLASTY BIPOLAR HIP (HEMIARTHROPLASTY);  Surgeon: Christena FlakeJohn J Poggi, Quinn;  Location: [facility] ORS;  Service: Orthopedics;  Laterality: Right;    SOCIAL HISTORY:   Social History   Tobacco Use  . Smoking status: Former Smoker    Packs/day: 1.00    Years: 40.00    Pack years: 40.00    Types: Cigarettes  . Smokeless tobacco: Never Used  Substance Use Topics  . Alcohol use: No    FAMILY HISTORY:   Family History  Problem Relation Age of Onset  . Breast cancer Sister [id number]  . Kidney disease Father     DRUG ALLERGIES:   Allergies  Allergen Reactions  .  Aggrenox [Aspirin-Dipyridamole Er]     A really bad headache  . Augmentin [Amoxicillin-Pot Clavulanate]     unknown  . Penicillins Other (See Comments)    Has patient had a PCN reaction causing immediate rash, facial/tongue/throat swelling, SOB or lightheadedness with hypotension: Yes Has patient had a PCN reaction causing severe rash involving mucus membranes or skin necrosis: Yes Has patient had a PCN reaction that required hospitalization No Has patient had a PCN reaction occurring within the last 10 years: No If all of the above answers are "NO", then may  proceed with Cephalosporin use.  . Remeron [Mirtazapine]     Broke out inside the mouth  . Sulfa Antibiotics     Broke out in welps and had to be hospitalized    REVIEW OF SYSTEMS:   Review of Systems  Constitutional: Negative for fever and weight loss.  HENT: Negative for congestion, nosebleeds and tinnitus.   Eyes: Negative for blurred vision, double vision and redness.  Respiratory: Positive for cough, sputum production (Green-yellow sputum), shortness of breath and wheezing. Negative for hemoptysis.   Cardiovascular: Negative for chest pain, orthopnea, leg swelling and PND.  Gastrointestinal: Negative for abdominal pain, diarrhea, melena, nausea and vomiting.  Genitourinary: Negative for dysuria, hematuria and urgency.  Musculoskeletal: Negative for falls and joint pain.  Neurological: Negative for dizziness, tingling, sensory change, focal weakness, seizures, weakness and headaches.  Endo/Heme/Allergies: Negative for polydipsia. Does not bruise/bleed easily.  Psychiatric/Behavioral: Negative for depression and memory loss. The patient is not nervous/anxious.     MEDICATIONS AT HOME:   Prior to Admission medications   Medication Sig Start Date End Date Taking? Authorizing Provider  acetaminophen (TYLENOL) 500 MG tablet Take 1 tablet (500 mg total) by mouth every 6 (six) hours as needed for moderate pain or headache. 10/22/16  Yes Gouru, Aruna, Quinn  albuterol (PROVENTIL HFA;VENTOLIN HFA) 108 (90 Base) MCG/ACT inhaler Inhale 2 puffs into the lungs every 6 (six) hours as needed for wheezing or shortness of breath.   Yes Provider, Historical, Quinn  ALPRAZolam (XANAX) 0.25 MG tablet Take 0.25 mg by mouth at bedtime.    Yes Provider, Historical, Quinn  atorvastatin (LIPITOR) 40 MG tablet Take 40 mg by mouth every evening.    Yes Provider, Historical, Quinn  benzonatate (TESSALON) 200 MG capsule Take 1 capsule by mouth 3 (three) times daily as needed for cough.    Yes Provider, Historical, Quinn   budesonide-formoterol (SYMBICORT) 160-4.5 MCG/ACT inhaler Inhale 2 puffs into the lungs 2 (two) times daily.   Yes Provider, Historical, Quinn  Cholecalciferol 2000 units CAPS Take 2,000 Units by mouth every morning.    Yes Provider, Historical, Quinn  clopidogrel (PLAVIX) 75 MG tablet Take 75 mg by mouth every morning.   Yes Provider, Historical, Quinn  gabapentin (NEURONTIN) 300 MG capsule Take 300 mg by mouth 2 (two) times daily.   Yes Provider, Historical, Quinn  hydrALAZINE (APRESOLINE) 25 MG tablet Take 25 mg by mouth 3 (three) times daily.    Yes Provider, Historical, Quinn  levofloxacin (LEVAQUIN) 500 MG tablet Take 500 mg by mouth daily.  03/14/18 03/24/18 Yes Provider, Historical, Quinn  losartan (COZAAR) 50 MG tablet Take 50 mg by mouth 2 (two) times daily.    Yes Provider, Historical, Quinn  metoprolol succinate (TOPROL-XL) 25 MG 24 hr tablet Take 25 mg by mouth daily.    Yes Provider, Historical, Quinn  omeprazole (PRILOSEC) 20 MG capsule Take 20 mg by mouth 2 (two) times daily before a  meal.   Yes Provider, Historical, Quinn  vitamin B-12 (CYANOCOBALAMIN) 1000 MCG tablet Take 1,000 mcg by mouth daily.   Yes Provider, Historical, Quinn  Vitamin D, Ergocalciferol, (DRISDOL) 50000 units CAPS capsule Take 1 capsule by mouth once a week. 02/28/18  Yes Provider, Historical, Quinn      VITAL SIGNS:  Blood pressure (!) 168/70, pulse 77, temperature 97.8 F (36.6 C), temperature source Oral, height 5\' 6"  (1.676 m), weight 62.1 kg, SpO2 96 %.  PHYSICAL EXAMINATION:  Physical Exam  GENERAL:  82 y.o.-year-old patient lying in the bed in mild Resp. distress.  EYES: Pupils equal, round, reactive to light and accommodation. No scleral icterus. Extraocular muscles intact.  HEENT: Head atraumatic, normocephalic. Oropharynx and nasopharynx clear. No oropharyngeal erythema, moist oral mucosa  NECK:  Supple, no jugular venous distention. No thyroid enlargement, no tenderness.  LUNGS: Prolonged inspiratory and expiratory phase,  diffuse end expiratory wheezing bilaterally, no rales, rhonchi, positive use of accessory muscles. CARDIOVASCULAR: S1, S2 RRR. No murmurs, rubs, gallops, clicks.  ABDOMEN: Soft, nontender, nondistended. Bowel sounds present. No organomegaly or mass.  EXTREMITIES: No pedal edema, cyanosis, or clubbing. + 2 pedal & radial pulses b/l.   NEUROLOGIC: Cranial nerves II through XII are intact. No focal Motor or sensory deficits appreciated b/l PSYCHIATRIC: The patient is alert and oriented x 3.  SKIN: No obvious rash, lesion, or ulcer.   LABORATORY PANEL:   CBC Recent Labs  Lab 03/21/18 1437  WBC 6.7  HGB 12.2  HCT 35.5  PLT 193   ------------------------------------------------------------------------------------------------------------------  Chemistries  Recent Labs  Lab 03/21/18 1437  NA 130*  K 4.3  CL 97*  CO2 26  GLUCOSE 107*  BUN 14  CREATININE 1.28*  CALCIUM 9.0   ------------------------------------------------------------------------------------------------------------------  Cardiac Enzymes Recent Labs  Lab 03/21/18 1700  TROPONINI <0.03   ------------------------------------------------------------------------------------------------------------------  RADIOLOGY:  Dg Chest Port 1 View  Result Date: 03/21/2018 CLINICAL DATA:  Difficulty breathing EXAM: PORTABLE CHEST 1 VIEW COMPARISON:  March 18, 2018 FINDINGS: There is chronic scarring in the lateral right base with blunting of the right costophrenic angle. There is no evident edema or consolidation. Heart size and pulmonary vascular normal. There is aortic atherosclerosis. There is a moderate hiatal hernia. No evident bone lesions. IMPRESSION: Chronic scarring lateral right base. No edema or consolidation. Stable cardiac silhouette. There is aortic atherosclerosis. There is a focal hiatal hernia. Aortic Atherosclerosis (ICD10-I70.0). Electronically Signed   By: Bretta BangWilliam  Woodruff III M.D.   On: 03/21/2018 17:18      IMPRESSION AND PLAN:   82 year old female with past medical history of COPD, chronic respiratory failure, hypertension, hyperlipidemia, GERD, history of aortic aneurysm, previous CVA who presents to the hospital due to shortness of breath, weakness and feeling hot.  1.  COPD exacerbation-this is a cause of patient's worsening shortness of breath and weakness.  Patient has failed outpatient treatment with oral prednisone taper and Zithromax. - Patient's chest x-ray on admission shows no acute pneumonia.  Will admit the patient to the hospital start her on IV steroids, scheduled duo nebs, Pulmicort nebs, will continue her Levaquin for now. -I will also get a CT of her chest noncontrast.  2.  Essential hypertension-continue Toprol, losartan, hydralazine.  3.  Neuropathy-continue Neurontin.  4.  GERD-continue Protonix.  5.  History of previous CVA- continue Plavix, atorvastatin.  6.  Hyperlipidemia-continue atorvastatin.  7.  Anxiety-continued Xanax as needed.    All the records are reviewed and case discussed with ED provider. Management  plans discussed with the patient, family and they are in agreement.  CODE STATUS: Full code  TOTAL TIME TAKING CARE OF THIS PATIENT: 45 minutes.    Houston Siren M.D on 03/21/2018 at 7:11 PM  Between 7am to 6pm - Pager - [phone]  After 6pm go to [email or web address] - password [email or web address] [facility]  [place] [facility]  Office  [phone]  CC: Primary care physician; Donna Arbour, Quinn

## 2018-03-22 LAB — CBC
HCT: 33.1 % — ABNORMAL LOW (ref 35.0–47.0)
Hemoglobin: 11.4 g/dL — ABNORMAL LOW (ref 12.0–16.0)
MCH: 29 pg (ref 26.0–34.0)
MCHC: 34.4 g/dL (ref 32.0–36.0)
MCV: 84.5 fL (ref 80.0–100.0)
PLATELETS: 174 10*3/uL (ref 150–440)
RBC: 3.92 MIL/uL (ref 3.80–5.20)
RDW: 14.7 % — AB (ref 11.5–14.5)
WBC: 4.3 10*3/uL (ref 3.6–11.0)

## 2018-03-22 MED ORDER — LABETALOL HCL 5 MG/ML IV SOLN: 10.0000 mg | INTRAVENOUS | Status: DC | PRN

## 2018-03-22 MED ORDER — ALUM & MAG HYDROXIDE-SIMETH 200-200-20 MG/5ML PO SUSP
30.0000 mL | Freq: Four times a day (QID) | ORAL | Status: DC | PRN
  Administered 2018-03-22: 30 mL via ORAL
  Filled 2018-03-22: qty 30

## 2018-03-22 NOTE — Progress Notes (Addendum)
[employer] - [place] at [facility]   PATIENT NAME: Donna Quinn    MR#:  [id number]  DATE OF BIRTH:  01/29/1936  SUBJECTIVE:  CHIEF COMPLAINT:   Chief Complaint  Patient presents with  . Shortness of Breath  Patient seen and evaluated today Has wheezing Shortness of breath on and off Occasional cough  REVIEW OF SYSTEMS:    ROS  CONSTITUTIONAL: No documented fever. No fatigue, weakness. No weight gain, no weight loss.  EYES: No blurry or double vision.  ENT: No tinnitus. No postnasal drip. No redness of the oropharynx.  RESPIRATORY: Has cough, Has wheeze, no hemoptysis. Has dyspnea.  CARDIOVASCULAR: No chest pain. No orthopnea. No palpitations. No syncope.  GASTROINTESTINAL: No nausea, no vomiting or diarrhea. No abdominal pain. No melena or hematochezia.  GENITOURINARY: No dysuria or hematuria.  ENDOCRINE: No polyuria or nocturia. No heat or cold intolerance.  HEMATOLOGY: No anemia. No bruising. No bleeding.  INTEGUMENTARY: No rashes. No lesions.  MUSCULOSKELETAL: No arthritis. No swelling. No gout.  NEUROLOGIC: No numbness, tingling, or ataxia. No seizure-type activity.  PSYCHIATRIC: No anxiety. No insomnia. No ADD.   DRUG ALLERGIES:   Allergies  Allergen Reactions  . Aggrenox [Aspirin-Dipyridamole Er]     A really bad headache  . Augmentin [Amoxicillin-Pot Clavulanate]     unknown  . Penicillins Other (See Comments)    Has patient had a PCN reaction causing immediate rash, facial/tongue/throat swelling, SOB or lightheadedness with hypotension: Yes Has patient had a PCN reaction causing severe rash involving mucus membranes or skin necrosis: Yes Has patient had a PCN reaction that required hospitalization No Has patient had a PCN reaction occurring within the last 10 years: No If all of the above answers are "NO", then may proceed with Cephalosporin use.  . Remeron [Mirtazapine]     Broke out inside the mouth  . Sulfa Antibiotics     Broke out in  welps and had to be hospitalized    VITALS:  Blood pressure (!) 177/65, pulse 83, temperature (!) 97.5 F (36.4 C), temperature source Oral, resp. rate 20, height 5\' 6"  (1.676 m), weight 62.1 kg, SpO2 96 %.  PHYSICAL EXAMINATION:   Physical Exam  GENERAL:  82 y.o.-year-old patient lying in the bed with no acute distress.  EYES: Pupils equal, round, reactive to light and accommodation. No scleral icterus. Extraocular muscles intact.  HEENT: Head atraumatic, normocephalic. Oropharynx and nasopharynx clear.  NECK:  Supple, no jugular venous distention. No thyroid enlargement, no tenderness.  LUNGS: Decreased breath sounds bilaterally, bilateral wheezing. No use of accessory muscles of respiration.  CARDIOVASCULAR: S1, S2 normal. No murmurs, rubs, or gallops.  ABDOMEN: Soft, nontender, nondistended. Bowel sounds present. No organomegaly or mass.  EXTREMITIES: No cyanosis, clubbing or edema b/l.    NEUROLOGIC: Cranial nerves II through XII are intact. No focal Motor or sensory deficits b/l.   PSYCHIATRIC: The patient is alert and oriented x 3.  SKIN: No obvious rash, lesion, or ulcer.   LABORATORY PANEL:   CBC Recent Labs  Lab 03/22/18 0338  WBC 4.3  HGB 11.4*  HCT 33.1*  PLT 174   ------------------------------------------------------------------------------------------------------------------ Chemistries  Recent Labs  Lab 03/21/18 1437  NA 130*  K 4.3  CL 97*  CO2 26  GLUCOSE 107*  BUN 14  CREATININE 1.28*  CALCIUM 9.0   ------------------------------------------------------------------------------------------------------------------  Cardiac Enzymes Recent Labs  Lab 03/21/18 1700  TROPONINI <0.03   ------------------------------------------------------------------------------------------------------------------  RADIOLOGY:  Ct Chest Wo Contrast  Result Date:  03/21/2018 CLINICAL DATA:  COPD exacerbation. EXAM: CT CHEST WITHOUT CONTRAST TECHNIQUE:  Multidetector CT imaging of the chest was performed following the standard protocol without IV contrast. COMPARISON:  Chest x-ray from same day. CT chest dated August 06, 2016. FINDINGS: Cardiovascular: Normal heart size. No pericardial effusion. Normal caliber thoracic aorta. Coronary, aortic arch, and branch vessel atherosclerotic vascular disease. Mediastinum/Nodes: No enlarged mediastinal or axillary lymph nodes. Thyroid gland, trachea, and esophagus demonstrate no significant findings. Unchanged large hiatal hernia. Lungs/Pleura: Unchanged scarring in the right lower lobe. New 5 mm pulmonary nodule in the right lower lobe (series 3, image 121). Unchanged mild centrilobular emphysema. No focal consolidation, pleural effusion, or pneumothorax. Upper Abdomen: No acute abnormality. Unchanged 3.4 cm peripherally calcified right adrenal nodule, stable since 2007, consistent with benign etiology. Musculoskeletal: No chest wall mass or suspicious bone lesions identified. IMPRESSION: 1.  No acute intrathoracic process. 2. New 5 mm pulmonary nodule in the right lower lobe. No follow-up needed if patient is low-risk. Non-contrast chest CT can be considered in 12 months if patient is high-risk. This recommendation follows the consensus statement: Guidelines for Management of Incidental Pulmonary Nodules Detected on CT Images: From the Fleischner Society 2017; Radiology 2017; 284:228-243. 3. Unchanged large hiatal hernia. 4.  Emphysema (ICD10-J43.9). 5.  Aortic atherosclerosis (ICD10-I70.0). Electronically Signed   By: Obie DredgeWilliam T Derry M.D.   On: 03/21/2018 19:37   Dg Chest Port 1 View  Result Date: 03/21/2018 CLINICAL DATA:  Difficulty breathing EXAM: PORTABLE CHEST 1 VIEW COMPARISON:  March 18, 2018 FINDINGS: There is chronic scarring in the lateral right base with blunting of the right costophrenic angle. There is no evident edema or consolidation. Heart size and pulmonary vascular normal. There is aortic  atherosclerosis. There is a moderate hiatal hernia. No evident bone lesions. IMPRESSION: Chronic scarring lateral right base. No edema or consolidation. Stable cardiac silhouette. There is aortic atherosclerosis. There is a focal hiatal hernia. Aortic Atherosclerosis (ICD10-I70.0). Electronically Signed   By: Bretta BangWilliam  Woodruff III M.D.   On: 03/21/2018 17:18     ASSESSMENT AND PLAN:  82 year old female patient with history of chronic respiratory failure, COPD, hypertension, hyperlipidemia, history of aortic aneurysm, GERD currently under hospitalist service for shortness of breath and wheezing  -Acute on chronic respiratory failure Oxygen via nasal cannula  -Acute COPD exacerbation Failed outpatient prednisone therapy IV Solu-Medrol and nebulization treatments aggressively Oral Levaquin antibiotic based on renal dosing parameters  -GERD Continue oral proton pump inhibitor  -History of CVA continue Plavix and atorvastatin  -Anxiety disorder Anxiolytics as needed  -Hypertension Continue ARB and beta-blocker with hydralazine  -Pulmonary nodule Outpatient periodic imaging studies   All the records are reviewed and case discussed with Care Management/Social Worker. Management plans discussed with the patient, family and they are in agreement.  CODE STATUS: Full code  DVT Prophylaxis: SCDs  TOTAL TIME TAKING CARE OF THIS PATIENT: 35 minutes.   POSSIBLE D/C IN 1 to 2 DAYS, DEPENDING ON CLINICAL CONDITION.  Ihor AustinPavan Pyreddy M.D on 03/22/2018 at 2:35 PM  Between 7am to 6pm - Pager - [phone]  After 6pm go to [email or web address] - password [email or web address] [facility]  [facility] [place] [facility]  Office  [phone]  CC: Primary care physician; Marguarite ArbourSparks, Jeffrey D, MD  Note: This dictation was prepared with Dragon dictation along with smaller phrase technology. Any transcriptional errors that result from this process are unintentional.

## 2018-03-22 NOTE — Progress Notes (Signed)
Advanced care plan.  Purpose of the Encounter: CODE STATUS  Parties in Attendance:Patient  Patient's Decision Capacity:Good  Subjective/Patient's story: Presented for shortness of breath and wheezing   Objective/Medical story Has copd exacerbation Needs IV solumedrol and aggressive nebulization treatments   Goals of care determination:  Advance care directives and goals of care discussed Patient wants everything done for now which includes CPR, intubation the need arises   CODE STATUS: Full code   Time spent discussing advanced care planning: 16 minutes

## 2018-03-23 MED ORDER — PREDNISONE 10 MG PO TABS: ORAL_TABLET | ORAL | 0 refills | Status: DC

## 2018-03-23 MED ORDER — LEVOFLOXACIN 250 MG PO TABS: 250.0000 mg | ORAL_TABLET | Freq: Every day | ORAL | 0 refills | Status: AC

## 2018-03-23 MED ORDER — IPRATROPIUM-ALBUTEROL 0.5-2.5 (3) MG/3ML IN SOLN
3.0000 mL | Freq: Three times a day (TID) | RESPIRATORY_TRACT | Status: DC
  Administered 2018-03-23: 3 mL via RESPIRATORY_TRACT
  Filled 2018-03-23: qty 3

## 2018-03-23 NOTE — Discharge Summary (Signed)
[facility]   PATIENT NAME: Donna Quinn    MR#:  [id number]  DATE OF BIRTH:  03/03/1936  DATE OF ADMISSION:  03/21/2018 ADMITTING PHYSICIAN: Houston Siren, MD  DATE OF DISCHARGE: 03/23/2018 11:54 AM  PRIMARY CARE PHYSICIAN: Marguarite Arbour, MD    ADMISSION DIAGNOSIS:  COPD exacerbation (HCC) [J44.1]  DISCHARGE DIAGNOSIS:  Active Problems:   COPD exacerbation (HCC)   SECONDARY DIAGNOSIS:   Past Medical History:  Diagnosis Date  . AAA (abdominal aortic aneurysm) (HCC)   . AAA (abdominal aortic aneurysm) (HCC)   . Abdominal aortic aneurysm (AAA) (HCC)   . Brain aneurysm   . Collagen vascular disease (HCC)   . COPD (chronic obstructive pulmonary disease) (HCC)   . Dyspnea   . GERD (gastroesophageal reflux disease)   . H/O wheezing   . Hypercholesterolemia   . Hypertension   . Neuropathy   . Osteoporosis   . Stroke [facility]) 2016   twice    HOSPITAL COURSE:   82 year old female with past medical history of COPD, chronic respiratory failure, hypertension, hyperlipidemia, GERD, history of aortic aneurysm, previous CVA who presents to the hospital due to shortness of breath, weakness and feeling hot.  1.  COPD exacerbation-this was a cause of patient's worsening shortness of breath and weakness.  Patient has failed outpatient treatment with oral prednisone taper and Zithromax. -Patient was admitted to the hospital treated with IV steroids, scheduled duo nebs, Pulmicort nebs.  A CT chest noncontrast was also obtained which showed just emphysema with pulmonary nodule but no other acute pathology. -After aggressive therapy patient has clinically improved not being discharged on oral prednisone taper and Levaquin for a few days. - She is already on oxygen at home and will continue that.  2.  Essential hypertension-she will continue Toprol, losartan, hydralazine.  3.  Neuropathy- she will continue Neurontin.  4.  GERD- she will  continue Protonix.  5.  History of previous CVA- she will continue Plavix, atorvastatin.  6.  Hyperlipidemia-  She will continue atorvastatin.  7.  Anxiety-continued Xanax as needed.  Patient was arranged for home health nursing services upon discharge.  DISCHARGE CONDITIONS:   Stable.   CONSULTS OBTAINED:    DRUG ALLERGIES:   Allergies  Allergen Reactions  . Aggrenox [Aspirin-Dipyridamole Er]     A really bad headache  . Augmentin [Amoxicillin-Pot Clavulanate]     unknown  . Penicillins Other (See Comments)    Has patient had a PCN reaction causing immediate rash, facial/tongue/throat swelling, SOB or lightheadedness with hypotension: Yes Has patient had a PCN reaction causing severe rash involving mucus membranes or skin necrosis: Yes Has patient had a PCN reaction that required hospitalization No Has patient had a PCN reaction occurring within the last 10 years: No If all of the above answers are "NO", then may proceed with Cephalosporin use.  . Remeron [Mirtazapine]     Broke out inside the mouth  . Sulfa Antibiotics     Broke out in welps and had to be hospitalized    DISCHARGE MEDICATIONS:   Allergies as of 03/23/2018      Reactions   Aggrenox [aspirin-dipyridamole Er]    A really bad headache   Augmentin [amoxicillin-pot Clavulanate]    unknown   Penicillins Other (See Comments)   Has patient had a PCN reaction causing immediate rash, facial/tongue/throat swelling, SOB or lightheadedness with hypotension: Yes Has patient had a PCN reaction causing severe rash involving mucus  membranes or skin necrosis: Yes Has patient had a PCN reaction that required hospitalization No Has patient had a PCN reaction occurring within the last 10 years: No If all of the above answers are "NO", then may proceed with Cephalosporin use.   Remeron [mirtazapine]    Broke out inside the mouth   Sulfa Antibiotics    Broke out in welps and had to be hospitalized      Medication  List    TAKE these medications   acetaminophen 500 MG tablet Commonly known as:  TYLENOL Take 1 tablet (500 mg total) by mouth every 6 (six) hours as needed for moderate pain or headache.   albuterol 108 (90 Base) MCG/ACT inhaler Commonly known as:  PROVENTIL HFA;VENTOLIN HFA Inhale 2 puffs into the lungs every 6 (six) hours as needed for wheezing or shortness of breath.   ALPRAZolam 0.25 MG tablet Commonly known as:  XANAX Take 0.25 mg by mouth at bedtime.   atorvastatin 40 MG tablet Commonly known as:  LIPITOR Take 40 mg by mouth every evening.   benzonatate 200 MG capsule Commonly known as:  TESSALON Take 1 capsule by mouth 3 (three) times daily as needed for cough.   budesonide-formoterol 160-4.5 MCG/ACT inhaler Commonly known as:  SYMBICORT Inhale 2 puffs into the lungs 2 (two) times daily.   Cholecalciferol 2000 units Caps Take 2,000 Units by mouth every morning.   clopidogrel 75 MG tablet Commonly known as:  PLAVIX Take 75 mg by mouth every morning.   gabapentin 300 MG capsule Commonly known as:  NEURONTIN Take 300 mg by mouth 2 (two) times daily.   hydrALAZINE 25 MG tablet Commonly known as:  APRESOLINE Take 25 mg by mouth 3 (three) times daily.   levofloxacin 250 MG tablet Commonly known as:  LEVAQUIN Take 1 tablet (250 mg total) by mouth daily for 5 days. What changed:    medication strength  how much to take   losartan 50 MG tablet Commonly known as:  COZAAR Take 50 mg by mouth 2 (two) times daily.   metoprolol succinate 25 MG 24 hr tablet Commonly known as:  TOPROL-XL Take 25 mg by mouth daily.   omeprazole 20 MG capsule Commonly known as:  PRILOSEC Take 20 mg by mouth 2 (two) times daily before a meal.   predniSONE 10 MG tablet Commonly known as:  DELTASONE Label  & dispense according to the schedule below. 5 Pills PO for 1 day then, 4 Pills PO for 1 day, 3 Pills PO for 1 day, 2 Pills PO for 1 day, 1 Pill PO for 1 days then STOP.    vitamin B-12 1000 MCG tablet Commonly known as:  CYANOCOBALAMIN Take 1,000 mcg by mouth daily.   Vitamin D (Ergocalciferol) 50000 units Caps capsule Commonly known as:  DRISDOL Take 1 capsule by mouth once a week.         DISCHARGE INSTRUCTIONS:   DIET:  Cardiac diet  DISCHARGE CONDITION:  Stable  ACTIVITY:  Activity as tolerated  OXYGEN:  Home Oxygen: Yes.     Oxygen Delivery: 2 liters/min via Patient connected to nasal cannula oxygen  DISCHARGE LOCATION:  Home with Home health nursing.    If you experience worsening of your admission symptoms, develop shortness of breath, life threatening emergency, suicidal or homicidal thoughts you must seek medical attention immediately by calling 911 or calling your MD immediately  if symptoms less severe.  You Must read complete instructions/literature along with all the possible  adverse reactions/side effects for all the Medicines you take and that have been prescribed to you. Take any new Medicines after you have completely understood and accpet all the possible adverse reactions/side effects.   Please note  You were cared for by a hospitalist during your hospital stay. If you have any questions about your discharge medications or the care you received while you were in the hospital after you are discharged, you can call the unit and asked to speak with the hospitalist on call if the hospitalist that took care of you is not available. Once you are discharged, your primary care physician will handle any further medical issues. Please note that NO REFILLS for any discharge medications will be authorized once you are discharged, as it is imperative that you return to your primary care physician (or establish a relationship with a primary care physician if you do not have one) for your aftercare needs so that they can reassess your need for medications and monitor your lab values.     Today   Shortness of breath, weakness much  improved since admission.  Afebrile, hemodynamically stable.  Will discharge home today on oral prednisone taper and empiric antibiotics.  Discussed with patient and daughter and they are in agreement with plan.  VITAL SIGNS:  Blood pressure 133/60, pulse 67, temperature 97.7 F (36.5 C), temperature source Oral, resp. rate 18, height 5\' 6"  (1.676 m), weight 62.1 kg, SpO2 98 %.  I/O:    Intake/Output Summary (Last 24 hours) at 03/23/2018 1537 Last data filed at 03/23/2018 1030 Gross per 24 hour  Intake 240 ml  Output -  Net 240 ml    PHYSICAL EXAMINATION:   GENERAL:  82 y.o.-year-old patient lying in the bed with no acute distress.  EYES: Pupils equal, round, reactive to light and accommodation. No scleral icterus. Extraocular muscles intact.  HEENT: Head atraumatic, normocephalic. Oropharynx and nasopharynx clear.  NECK:  Supple, no jugular venous distention. No thyroid enlargement, no tenderness.  LUNGS: Normal breath sounds bilaterally, no wheezing, rales,rhonchi. No use of accessory muscles of respiration.  CARDIOVASCULAR: S1, S2 normal. No murmurs, rubs, or gallops.  ABDOMEN: Soft, non-tender, non-distended. Bowel sounds present. No organomegaly or mass.  EXTREMITIES: No pedal edema, cyanosis, or clubbing.  NEUROLOGIC: Cranial nerves II through XII are intact. No focal motor or sensory defecits b/l.  PSYCHIATRIC: The patient is alert and oriented x 3.   SKIN: No obvious rash, lesion, or ulcer.   DATA REVIEW:   CBC Recent Labs  Lab 03/22/18 0338  WBC 4.3  HGB 11.4*  HCT 33.1*  PLT 174    Chemistries  Recent Labs  Lab 03/21/18 1437  NA 130*  K 4.3  CL 97*  CO2 26  GLUCOSE 107*  BUN 14  CREATININE 1.28*  CALCIUM 9.0    Cardiac Enzymes Recent Labs  Lab 03/21/18 1700  TROPONINI <0.03    RADIOLOGY:  Ct Chest Wo Contrast  Result Date: 03/21/2018 CLINICAL DATA:  COPD exacerbation. EXAM: CT CHEST WITHOUT CONTRAST TECHNIQUE: Multidetector CT imaging of the  chest was performed following the standard protocol without IV contrast. COMPARISON:  Chest x-ray from same day. CT chest dated August 06, 2016. FINDINGS: Cardiovascular: Normal heart size. No pericardial effusion. Normal caliber thoracic aorta. Coronary, aortic arch, and branch vessel atherosclerotic vascular disease. Mediastinum/Nodes: No enlarged mediastinal or axillary lymph nodes. Thyroid gland, trachea, and esophagus demonstrate no significant findings. Unchanged large hiatal hernia. Lungs/Pleura: Unchanged scarring in the right lower lobe. New  5 mm pulmonary nodule in the right lower lobe (series 3, image 121). Unchanged mild centrilobular emphysema. No focal consolidation, pleural effusion, or pneumothorax. Upper Abdomen: No acute abnormality. Unchanged 3.4 cm peripherally calcified right adrenal nodule, stable since 2007, consistent with benign etiology. Musculoskeletal: No chest wall mass or suspicious bone lesions identified. IMPRESSION: 1.  No acute intrathoracic process. 2. New 5 mm pulmonary nodule in the right lower lobe. No follow-up needed if patient is low-risk. Non-contrast chest CT can be considered in 12 months if patient is high-risk. This recommendation follows the consensus statement: Guidelines for Management of Incidental Pulmonary Nodules Detected on CT Images: From the Fleischner Society 2017; Radiology 2017; 284:228-243. 3. Unchanged large hiatal hernia. 4.  Emphysema (ICD10-J43.9). 5.  Aortic atherosclerosis (ICD10-I70.0). Electronically Signed   By: Obie DredgeWilliam T Derry M.D.   On: 03/21/2018 19:37   Dg Chest Port 1 View  Result Date: 03/21/2018 CLINICAL DATA:  Difficulty breathing EXAM: PORTABLE CHEST 1 VIEW COMPARISON:  March 18, 2018 FINDINGS: There is chronic scarring in the lateral right base with blunting of the right costophrenic angle. There is no evident edema or consolidation. Heart size and pulmonary vascular normal. There is aortic atherosclerosis. There is a moderate  hiatal hernia. No evident bone lesions. IMPRESSION: Chronic scarring lateral right base. No edema or consolidation. Stable cardiac silhouette. There is aortic atherosclerosis. There is a focal hiatal hernia. Aortic Atherosclerosis (ICD10-I70.0). Electronically Signed   By: Bretta BangWilliam  Woodruff III M.D.   On: 03/21/2018 17:18      Management plans discussed with the patient, family and they are in agreement.  CODE STATUS:  Code Status History    Date Active Date Inactive Code Status Order ID Comments User Context   03/21/2018 1958 03/23/2018 1509 Full Code [id number]  Houston SirenSainani, Heavenlee Maiorana J, MD Inpatient   TOTAL TIME TAKING CARE OF THIS PATIENT: 40 minutes.    Houston SirenSAINANI,Brannan Cassedy J M.D on 03/23/2018 at 3:37 PM  Between 7am to 6pm - Pager - [phone]  After 6pm go to [email or web address] - Social research officer, [email or web address] [email or web address]  [facility] [place] [facility]  Office  [phone]  CC: Primary care physician; Marguarite ArbourSparks, Jeffrey D, MD

## 2018-03-23 NOTE — Evaluation (Signed)
Physical Therapy Evaluation Patient Details Name: Donna Quinn MRN: [id number] DOB: 12/10/1935 Today's Date: 03/23/2018   History of Present Illness  82 y.o. female with a known history of COPD, chronic respiratory failure, hypertension, hyperlipidemia, GERD, history of previous CVA, neuropathy, osteoporosis, history of abdominal aortic aneurysm who presents to the hospital due to shortness of breath.    Clinical Impression  Pt sitting up at EOB and dressed for d/c on arrival, family in room.  She was eager to show that she could walk and be safe at home and did relatively well in this regard.  She was on room air t/o the exam and at rest and during ambulation her O2 sats stayed in the mid/low 90s the entire time.  She showed good effort and safety during ambulation, though she did have asymmetric gait pattern with R LE generally trailing behind (she and family report that this is near baseline.)  Overall pt did well and should be able to return home safely, recommending HHPT as pt is not as strong or confident as she is at baseline and will benefit in these and other functional limitations with continued care.    Follow Up Recommendations Home health PT    Equipment Recommendations  None recommended by PT    Recommendations for Other Services       Precautions / Restrictions Precautions Precautions: Fall Restrictions Weight Bearing Restrictions: No      Mobility  Bed Mobility Overal bed mobility: Modified Independent                Transfers Overall transfer level: Modified independent Equipment used: Rolling walker (2 wheeled)             General transfer comment: needed reminders to use UEs and walker appropriately, able to rise w/o direct physcial assist but with increased effort  Ambulation/Gait Ambulation/Gait assistance: Min guard Gait Distance (Feet): 100 Feet Assistive device: Rolling walker (2 wheeled)       General Gait Details: Pt with slow,  generally step-to, gait but was confident, had no LOBs and her vitals remained relatively stable t/o the effort (O2 remained in 90s on room air, HR 70s t/o majority of the bout)  Stairs            Wheelchair Mobility    Modified Rankin (Stroke Patients Only)       Balance Overall balance assessment: Modified Independent                                           Pertinent Vitals/Pain Pain Assessment: No/denies pain    Home Living Family/patient expects to be discharged to:: Private residence Living Arrangements: Alone Available Help at Discharge: Family(children check in QD) Type of Home: House Home Access: Stairs to enter Entrance Stairs-Rails: Can reach both Entrance Stairs-Number of Steps: 1 step, landing, 1 step   Home Equipment: Walker - 2 wheels;Cane - single point;Grab bars - tub/shower;Shower seat      Prior Function Level of Independence: Independent with assistive device(s)         Comments: Pt has not had a fall since hip fx last year, minimal in home activity but able to get out of the home with family near daily     Hand Dominance        Extremity/Trunk Assessment   Upper Extremity Assessment Upper Extremity Assessment: Generalized weakness(unable to elevated  b/l >90, L weaker than R)    Lower Extremity Assessment Lower Extremity Assessment: Generalized weakness(R grossly 4-/5, L grossly 3+/5)       Communication   Communication: No difficulties  Cognition Arousal/Alertness: Awake/alert Behavior During Therapy: WFL for tasks assessed/performed Overall Cognitive Status: Within Functional Limits for tasks assessed                                        General Comments      Exercises     Assessment/Plan    PT Assessment Patient needs continued PT services  PT Problem List Decreased strength;Decreased range of motion;Decreased mobility;Decreased activity tolerance;Decreased balance;Decreased  coordination;Decreased knowledge of use of DME;Decreased safety awareness;Decreased knowledge of precautions;Cardiopulmonary status limiting activity       PT Treatment Interventions DME instruction;Gait training;Stair training;Functional mobility training;Therapeutic activities;Therapeutic exercise;Balance training;Cognitive remediation;Patient/family education    PT Goals (Current goals can be found in the Care Plan section)  Acute Rehab PT Goals Patient Stated Goal: go home PT Goal Formulation: With patient/family Time For Goal Achievement: 04/06/18 Potential to Achieve Goals: Good    Frequency Min 2X/week   Barriers to discharge        Co-evaluation               AM-PAC PT "6 Clicks" Daily Activity  Outcome Measure Difficulty turning over in bed (including adjusting bedclothes, sheets and blankets)?: A Little Difficulty moving from lying on back to sitting on the side of the bed? : A Little Difficulty sitting down on and standing up from a chair with arms (e.g., wheelchair, bedside commode, etc,.)?: A Little Help needed moving to and from a bed to chair (including a wheelchair)?: None Help needed walking in hospital room?: A Little Help needed climbing 3-5 steps with a railing? : A Little 6 Click Score: 19    End of Session Equipment Utilized During Treatment: Gait belt Activity Tolerance: Patient tolerated treatment well Patient left: in bed;with call bell/phone within reach;with family/visitor present Nurse Communication: Mobility status PT Visit Diagnosis: Muscle weakness (generalized) (M62.81);Difficulty in walking, not elsewhere classified (R26.2)    Time: [id number] PT Time Calculation (min) (ACUTE ONLY): 15 min   Charges:   PT Evaluation $PT Eval Low Complexity: 1 Low          Malachi ProGalen R Brittnae Aschenbrenner, DPT 03/23/2018, 12:11 PM

## 2018-03-23 NOTE — Care Management Note (Signed)
Case Management Note  Patient Details  Name: Donna Quinn MRN: [id number] Date of Birth: 09/28/1935  Subjective/Objective:  Admitted with COPD exacerbation. Met with patient and her daughter at bedside to discuss discharge planning. Patient lives alone. She uses a walker in the home. Her children assist her at night. She is not on home O2. They are agreeable to home health. No agency preference once list presented. [facility]. PCP is J. Sparks. Discharging today                Action/Plan: [facility] for RN and PT.   Expected Discharge Date:  03/23/18               Expected Discharge Plan:  Home w Home Health Services  In-House Referral:     Discharge planning Services  CM Consult  Post Acute Care Choice:  Home Health Choice offered to:  Patient  DME Arranged:    DME Agency:     HH Arranged:  RN, PT HH Agency:  [facility]  Status of Service:  Completed, signed off  If discussed at Long Length of Stay Meetings, dates discussed:    Additional Comments:  Lisa M Jacobs, RN 03/23/2018, 11:33 AM  

## 2018-03-23 NOTE — Progress Notes (Signed)
Pt refused 0200 hr treatment.  

## 2018-03-23 NOTE — Progress Notes (Signed)
DISCHARGE NOTE:  Pt given discharge instructions and prescriptions (Prednisone/ Levaquin). Pt verbalized understanding. Pt notified that doctors office will call her with appt. Pt wheeled to car by volunteer.

## 2018-03-24 DIAGNOSIS — F419 Anxiety disorder, unspecified: Secondary | ICD-10-CM | POA: Diagnosis not present

## 2018-03-24 DIAGNOSIS — K219 Gastro-esophageal reflux disease without esophagitis: Secondary | ICD-10-CM | POA: Diagnosis not present

## 2018-03-24 DIAGNOSIS — R918 Other nonspecific abnormal finding of lung field: Secondary | ICD-10-CM | POA: Diagnosis not present

## 2018-03-24 DIAGNOSIS — E785 Hyperlipidemia, unspecified: Secondary | ICD-10-CM | POA: Diagnosis not present

## 2018-03-24 DIAGNOSIS — J9620 Acute and chronic respiratory failure, unspecified whether with hypoxia or hypercapnia: Secondary | ICD-10-CM | POA: Diagnosis not present

## 2018-03-24 DIAGNOSIS — I69354 Hemiplegia and hemiparesis following cerebral infarction affecting left non-dominant side: Secondary | ICD-10-CM | POA: Diagnosis not present

## 2018-03-24 DIAGNOSIS — I10 Essential (primary) hypertension: Secondary | ICD-10-CM | POA: Diagnosis not present

## 2018-03-24 DIAGNOSIS — J441 Chronic obstructive pulmonary disease with (acute) exacerbation: Secondary | ICD-10-CM | POA: Diagnosis not present

## 2018-03-24 DIAGNOSIS — Z7951 Long term (current) use of inhaled steroids: Secondary | ICD-10-CM | POA: Diagnosis not present

## 2018-03-27 DIAGNOSIS — I10 Essential (primary) hypertension: Secondary | ICD-10-CM | POA: Diagnosis not present

## 2018-03-27 DIAGNOSIS — Z7951 Long term (current) use of inhaled steroids: Secondary | ICD-10-CM | POA: Diagnosis not present

## 2018-03-27 DIAGNOSIS — I69354 Hemiplegia and hemiparesis following cerebral infarction affecting left non-dominant side: Secondary | ICD-10-CM | POA: Diagnosis not present

## 2018-03-27 DIAGNOSIS — J441 Chronic obstructive pulmonary disease with (acute) exacerbation: Secondary | ICD-10-CM | POA: Diagnosis not present

## 2018-03-27 DIAGNOSIS — R918 Other nonspecific abnormal finding of lung field: Secondary | ICD-10-CM | POA: Diagnosis not present

## 2018-03-27 DIAGNOSIS — J9620 Acute and chronic respiratory failure, unspecified whether with hypoxia or hypercapnia: Secondary | ICD-10-CM | POA: Diagnosis not present

## 2018-03-27 DIAGNOSIS — F419 Anxiety disorder, unspecified: Secondary | ICD-10-CM | POA: Diagnosis not present

## 2018-03-27 DIAGNOSIS — E785 Hyperlipidemia, unspecified: Secondary | ICD-10-CM | POA: Diagnosis not present

## 2018-03-27 DIAGNOSIS — K219 Gastro-esophageal reflux disease without esophagitis: Secondary | ICD-10-CM | POA: Diagnosis not present

## 2018-03-29 DIAGNOSIS — I69354 Hemiplegia and hemiparesis following cerebral infarction affecting left non-dominant side: Secondary | ICD-10-CM | POA: Diagnosis not present

## 2018-03-29 DIAGNOSIS — J9620 Acute and chronic respiratory failure, unspecified whether with hypoxia or hypercapnia: Secondary | ICD-10-CM | POA: Diagnosis not present

## 2018-03-29 DIAGNOSIS — J441 Chronic obstructive pulmonary disease with (acute) exacerbation: Secondary | ICD-10-CM | POA: Diagnosis not present

## 2018-03-29 DIAGNOSIS — Z7951 Long term (current) use of inhaled steroids: Secondary | ICD-10-CM | POA: Diagnosis not present

## 2018-03-29 DIAGNOSIS — K219 Gastro-esophageal reflux disease without esophagitis: Secondary | ICD-10-CM | POA: Diagnosis not present

## 2018-03-29 DIAGNOSIS — R918 Other nonspecific abnormal finding of lung field: Secondary | ICD-10-CM | POA: Diagnosis not present

## 2018-03-29 DIAGNOSIS — I10 Essential (primary) hypertension: Secondary | ICD-10-CM | POA: Diagnosis not present

## 2018-03-29 DIAGNOSIS — E785 Hyperlipidemia, unspecified: Secondary | ICD-10-CM | POA: Diagnosis not present

## 2018-03-29 DIAGNOSIS — F419 Anxiety disorder, unspecified: Secondary | ICD-10-CM | POA: Diagnosis not present

## 2018-03-30 DIAGNOSIS — J9620 Acute and chronic respiratory failure, unspecified whether with hypoxia or hypercapnia: Secondary | ICD-10-CM | POA: Diagnosis not present

## 2018-03-30 DIAGNOSIS — I69354 Hemiplegia and hemiparesis following cerebral infarction affecting left non-dominant side: Secondary | ICD-10-CM | POA: Diagnosis not present

## 2018-03-30 DIAGNOSIS — E785 Hyperlipidemia, unspecified: Secondary | ICD-10-CM | POA: Diagnosis not present

## 2018-03-30 DIAGNOSIS — R918 Other nonspecific abnormal finding of lung field: Secondary | ICD-10-CM | POA: Diagnosis not present

## 2018-03-30 DIAGNOSIS — K219 Gastro-esophageal reflux disease without esophagitis: Secondary | ICD-10-CM | POA: Diagnosis not present

## 2018-03-30 DIAGNOSIS — Z7951 Long term (current) use of inhaled steroids: Secondary | ICD-10-CM | POA: Diagnosis not present

## 2018-03-30 DIAGNOSIS — F419 Anxiety disorder, unspecified: Secondary | ICD-10-CM | POA: Diagnosis not present

## 2018-03-30 DIAGNOSIS — I10 Essential (primary) hypertension: Secondary | ICD-10-CM | POA: Diagnosis not present

## 2018-03-30 DIAGNOSIS — J441 Chronic obstructive pulmonary disease with (acute) exacerbation: Secondary | ICD-10-CM | POA: Diagnosis not present

## 2018-03-31 DIAGNOSIS — J441 Chronic obstructive pulmonary disease with (acute) exacerbation: Secondary | ICD-10-CM | POA: Diagnosis not present

## 2018-03-31 DIAGNOSIS — J431 Panlobular emphysema: Secondary | ICD-10-CM | POA: Diagnosis not present

## 2018-04-04 DIAGNOSIS — J9620 Acute and chronic respiratory failure, unspecified whether with hypoxia or hypercapnia: Secondary | ICD-10-CM | POA: Diagnosis not present

## 2018-04-04 DIAGNOSIS — I69354 Hemiplegia and hemiparesis following cerebral infarction affecting left non-dominant side: Secondary | ICD-10-CM | POA: Diagnosis not present

## 2018-04-04 DIAGNOSIS — J441 Chronic obstructive pulmonary disease with (acute) exacerbation: Secondary | ICD-10-CM | POA: Diagnosis not present

## 2018-04-04 DIAGNOSIS — F419 Anxiety disorder, unspecified: Secondary | ICD-10-CM | POA: Diagnosis not present

## 2018-04-04 DIAGNOSIS — E785 Hyperlipidemia, unspecified: Secondary | ICD-10-CM | POA: Diagnosis not present

## 2018-04-04 DIAGNOSIS — R918 Other nonspecific abnormal finding of lung field: Secondary | ICD-10-CM | POA: Diagnosis not present

## 2018-04-04 DIAGNOSIS — I10 Essential (primary) hypertension: Secondary | ICD-10-CM | POA: Diagnosis not present

## 2018-04-04 DIAGNOSIS — Z7951 Long term (current) use of inhaled steroids: Secondary | ICD-10-CM | POA: Diagnosis not present

## 2018-04-04 DIAGNOSIS — K219 Gastro-esophageal reflux disease without esophagitis: Secondary | ICD-10-CM | POA: Diagnosis not present

## 2018-04-07 DIAGNOSIS — J441 Chronic obstructive pulmonary disease with (acute) exacerbation: Secondary | ICD-10-CM | POA: Diagnosis not present

## 2018-04-07 DIAGNOSIS — I10 Essential (primary) hypertension: Secondary | ICD-10-CM | POA: Diagnosis not present

## 2018-04-07 DIAGNOSIS — R918 Other nonspecific abnormal finding of lung field: Secondary | ICD-10-CM | POA: Diagnosis not present

## 2018-04-07 DIAGNOSIS — F419 Anxiety disorder, unspecified: Secondary | ICD-10-CM | POA: Diagnosis not present

## 2018-04-07 DIAGNOSIS — E785 Hyperlipidemia, unspecified: Secondary | ICD-10-CM | POA: Diagnosis not present

## 2018-04-07 DIAGNOSIS — J9620 Acute and chronic respiratory failure, unspecified whether with hypoxia or hypercapnia: Secondary | ICD-10-CM | POA: Diagnosis not present

## 2018-04-07 DIAGNOSIS — Z7951 Long term (current) use of inhaled steroids: Secondary | ICD-10-CM | POA: Diagnosis not present

## 2018-04-07 DIAGNOSIS — I69354 Hemiplegia and hemiparesis following cerebral infarction affecting left non-dominant side: Secondary | ICD-10-CM | POA: Diagnosis not present

## 2018-04-07 DIAGNOSIS — K219 Gastro-esophageal reflux disease without esophagitis: Secondary | ICD-10-CM | POA: Diagnosis not present

## 2018-04-10 DIAGNOSIS — J9620 Acute and chronic respiratory failure, unspecified whether with hypoxia or hypercapnia: Secondary | ICD-10-CM | POA: Diagnosis not present

## 2018-04-10 DIAGNOSIS — E785 Hyperlipidemia, unspecified: Secondary | ICD-10-CM | POA: Diagnosis not present

## 2018-04-10 DIAGNOSIS — I10 Essential (primary) hypertension: Secondary | ICD-10-CM | POA: Diagnosis not present

## 2018-04-10 DIAGNOSIS — K219 Gastro-esophageal reflux disease without esophagitis: Secondary | ICD-10-CM | POA: Diagnosis not present

## 2018-04-10 DIAGNOSIS — J441 Chronic obstructive pulmonary disease with (acute) exacerbation: Secondary | ICD-10-CM | POA: Diagnosis not present

## 2018-04-10 DIAGNOSIS — I69354 Hemiplegia and hemiparesis following cerebral infarction affecting left non-dominant side: Secondary | ICD-10-CM | POA: Diagnosis not present

## 2018-04-10 DIAGNOSIS — R918 Other nonspecific abnormal finding of lung field: Secondary | ICD-10-CM | POA: Diagnosis not present

## 2018-04-10 DIAGNOSIS — F419 Anxiety disorder, unspecified: Secondary | ICD-10-CM | POA: Diagnosis not present

## 2018-04-10 DIAGNOSIS — Z7951 Long term (current) use of inhaled steroids: Secondary | ICD-10-CM | POA: Diagnosis not present

## 2018-04-12 ENCOUNTER — Institutional Professional Consult (permissible substitution): Payer: Medicare HMO | Admitting: Pulmonary Disease

## 2018-04-12 DIAGNOSIS — R918 Other nonspecific abnormal finding of lung field: Secondary | ICD-10-CM | POA: Diagnosis not present

## 2018-04-12 DIAGNOSIS — F419 Anxiety disorder, unspecified: Secondary | ICD-10-CM | POA: Diagnosis not present

## 2018-04-12 DIAGNOSIS — K219 Gastro-esophageal reflux disease without esophagitis: Secondary | ICD-10-CM | POA: Diagnosis not present

## 2018-04-12 DIAGNOSIS — J441 Chronic obstructive pulmonary disease with (acute) exacerbation: Secondary | ICD-10-CM | POA: Diagnosis not present

## 2018-04-12 DIAGNOSIS — I69354 Hemiplegia and hemiparesis following cerebral infarction affecting left non-dominant side: Secondary | ICD-10-CM | POA: Diagnosis not present

## 2018-04-12 DIAGNOSIS — E785 Hyperlipidemia, unspecified: Secondary | ICD-10-CM | POA: Diagnosis not present

## 2018-04-12 DIAGNOSIS — J9620 Acute and chronic respiratory failure, unspecified whether with hypoxia or hypercapnia: Secondary | ICD-10-CM | POA: Diagnosis not present

## 2018-04-12 DIAGNOSIS — Z7951 Long term (current) use of inhaled steroids: Secondary | ICD-10-CM | POA: Diagnosis not present

## 2018-04-12 DIAGNOSIS — I10 Essential (primary) hypertension: Secondary | ICD-10-CM | POA: Diagnosis not present

## 2018-04-17 DIAGNOSIS — R918 Other nonspecific abnormal finding of lung field: Secondary | ICD-10-CM | POA: Diagnosis not present

## 2018-04-17 DIAGNOSIS — F419 Anxiety disorder, unspecified: Secondary | ICD-10-CM | POA: Diagnosis not present

## 2018-04-17 DIAGNOSIS — K219 Gastro-esophageal reflux disease without esophagitis: Secondary | ICD-10-CM | POA: Diagnosis not present

## 2018-04-17 DIAGNOSIS — I10 Essential (primary) hypertension: Secondary | ICD-10-CM | POA: Diagnosis not present

## 2018-04-17 DIAGNOSIS — Z7951 Long term (current) use of inhaled steroids: Secondary | ICD-10-CM | POA: Diagnosis not present

## 2018-04-17 DIAGNOSIS — J441 Chronic obstructive pulmonary disease with (acute) exacerbation: Secondary | ICD-10-CM | POA: Diagnosis not present

## 2018-04-17 DIAGNOSIS — I69354 Hemiplegia and hemiparesis following cerebral infarction affecting left non-dominant side: Secondary | ICD-10-CM | POA: Diagnosis not present

## 2018-04-17 DIAGNOSIS — J9620 Acute and chronic respiratory failure, unspecified whether with hypoxia or hypercapnia: Secondary | ICD-10-CM | POA: Diagnosis not present

## 2018-04-17 DIAGNOSIS — E785 Hyperlipidemia, unspecified: Secondary | ICD-10-CM | POA: Diagnosis not present

## 2018-04-18 ENCOUNTER — Ambulatory Visit: Payer: Medicare HMO | Admitting: Pulmonary Disease

## 2018-04-18 ENCOUNTER — Encounter: Payer: Self-pay | Admitting: Pulmonary Disease

## 2018-04-18 VITALS — BP 132/78 | HR 77 | Ht 66.0 in | Wt 134.0 lb

## 2018-04-18 DIAGNOSIS — M40204 Unspecified kyphosis, thoracic region: Secondary | ICD-10-CM | POA: Diagnosis not present

## 2018-04-18 DIAGNOSIS — J432 Centrilobular emphysema: Secondary | ICD-10-CM | POA: Diagnosis not present

## 2018-04-18 DIAGNOSIS — R911 Solitary pulmonary nodule: Secondary | ICD-10-CM | POA: Diagnosis not present

## 2018-04-18 DIAGNOSIS — J449 Chronic obstructive pulmonary disease, unspecified: Secondary | ICD-10-CM | POA: Diagnosis not present

## 2018-04-18 MED ORDER — BUDESONIDE 0.25 MG/2ML IN SUSP: 0.2500 mg | Freq: Two times a day (BID) | RESPIRATORY_TRACT | 10 refills | Status: DC

## 2018-04-18 NOTE — Patient Instructions (Addendum)
Stop Symbicort  Initiate nebulized budesonide (Pulmicort), 0.25 mg nebulized twice a day.  May be mixed with DuoNeb  Change DuoNeb (albuterol-ipratropium) nebulized 3-4 times per day and may be used as needed up to every 4 hours for shortness of breath, chest tightness, wheezing, cough  Lung function tests (PFTs) ordered  Follow-up in 4 to 6 weeks.  Call sooner if needed

## 2018-04-18 NOTE — Progress Notes (Signed)
PULMONARY CONSULT NOTE  Requesting MD/Service: Judithann Sheen Date of initial consultation: 04/18/18 Reason for consultation: COPD  PT PROFILE: 82 y.o. female former smoker (1 PPD x50 years, quit 2015) with severe COPD  DATA: 03/21/18 CT chest: Unchanged scarring in the right lower lobe. New 5 mm pulmonary nodule in the very inferior-posterior aspect of the RLL (series 3, image 121). Mild centrilobular emphysema   INTERVAL:  HPI:  Due to a prior CVA, the patient has some difficulty providing history.  Her daughter helped provide much of the following history.  The patient has a history of COPD first diagnosed many years ago.  She was initially treated with Symbicort and her daughter notes that it made a significant difference when it was first initiated.  At her baseline she is moderately to severely limited as much by weakness and frailty as by dyspnea.  There is little day-to-day variation.  She was hospitalized 8/13-8/15 of this year with a COPD exacerbation.  Per her daughter, she was "the best I have ever seen" after discharge.  She was discharged home on Symbicort and Spiriva as maintenance medications.  However, she has been unable to afford the Spiriva.  She was also discharged home on nebulized albuterol-ipratropium which she is presently using 2 times per day.  This seems to give her the most benefit.  She has an albuterol rescue inhaler which she is not using very frequently.  She has mild chronic cough without hemoptysis.  She has no significant occupational or environmental exposures.  There is no significant travel history.  She denies orthopnea, paroxysmal nocturnal dyspnea, CP, fever, purulent sputum, hemoptysis, LE edema and calf tenderness.  Past Medical History:  Diagnosis Date  . AAA (abdominal aortic aneurysm) (HCC)   . Brain aneurysm   . Collagen vascular disease (HCC)   . COPD (chronic obstructive pulmonary disease) (HCC)   . Dyspnea   . GERD (gastroesophageal reflux disease)    . H/O wheezing   . Hypercholesterolemia   . Hypertension   . Neuropathy   . Osteoporosis   . Stroke [facility]) 2016   twice    Past Surgical History:  Procedure Laterality Date  . abdominal Bilateral    stents  . BACK SURGERY     cyst removed from back  . BRAIN SURGERY     Brain aneurysm repair, coiling  . CAROTID ENDARTERECTOMY    . CATARACT EXTRACTION W/PHACO Left 07/08/2016   Procedure: CATARACT EXTRACTION PHACO AND INTRAOCULAR LENS PLACEMENT (IOC);  Surgeon: Nevada Crane, MD;  Location: [facility] ORS;  Service: Ophthalmology;  Laterality: Left;  [place]  01:30 AP% 39.1 CDE 20.3 Fluid pack lot # [id number]  . CATARACT EXTRACTION W/PHACO Right 09/16/2016   Procedure: CATARACT EXTRACTION PHACO AND INTRAOCULAR LENS PLACEMENT (IOC);  Surgeon: Nevada Crane, MD;  Location: [facility] ORS;  Service: Ophthalmology;  Laterality: Right;  Lot # [id number] H [place]: 01"21.7 AP% 16.8: CDE: 13.90  . HIP ARTHROPLASTY Right 10/20/2016   Procedure: ARTHROPLASTY BIPOLAR HIP (HEMIARTHROPLASTY);  Surgeon: Christena Flake, MD;  Location: [facility] ORS;  Service: Orthopedics;  Laterality: Right;    MEDICATIONS: I have reviewed all medications and confirmed regimen as documented  Social History   Socioeconomic History  . Marital status: Widowed    Spouse name: Not on file  . Number of children: Not on file  . Years of education: Not on file  . Highest education level: Not on file  Occupational History  . Not on file  Social Needs  . Financial  resource strain: Not on file  . Food insecurity:    Worry: Not on file    Inability: Not on file  . Transportation needs:    Medical: Not on file    Non-medical: Not on file  Tobacco Use  . Smoking status: Former Smoker    Packs/day: 1.00    Years: 40.00    Pack years: 40.00    Types: Cigarettes  . Smokeless tobacco: Never Used  Substance and Sexual Activity  . Alcohol use: No  . Drug use: No  . Sexual activity: Not on file  Lifestyle  . Physical activity:     Days per week: Not on file    Minutes per session: Not on file  . Stress: Not on file  Relationships  . Social connections:    Talks on phone: Not on file    Gets together: Not on file    Attends religious service: Not on file    Active member of club or organization: Not on file    Attends meetings of clubs or organizations: Not on file    Relationship status: Not on file  . Intimate partner violence:    Fear of current or ex partner: Not on file    Emotionally abused: Not on file    Physically abused: Not on file    Forced sexual activity: Not on file  Other Topics Concern  . Not on file  Social History Narrative  . Not on file    Family History  Problem Relation Age of Onset  . Breast cancer Sister 59  . Kidney disease Father     ROS: No fever, myalgias/arthralgias, unexplained weight loss or weight gain No new focal weakness or sensory deficits No otalgia, hearing loss, visual changes, nasal and sinus symptoms, mouth and throat problems No neck pain or adenopathy No abdominal pain, N/V/D, diarrhea, change in bowel pattern No dysuria, change in urinary pattern   Vitals:   04/18/18 1339 04/18/18 1346  BP:  132/78  Pulse:  77  SpO2:  97%  Weight: 134 lb (60.8 kg)   Height: 5\' 6"  (1.676 m)      EXAM:  Gen: Frail appearing, in WC, No overt respiratory distress HEENT: NCAT, sclera white, oropharynx normal Neck: Supple without LAN, thyromegaly, JVD Thorax/lungs: Moderate-severe kyphosis breath sounds moderately diminished without wheezes or other adventitious sounds, percussion normal Cardiovascular: RRR, no murmurs noted Abdomen: Soft, nontender, normal BS Ext: without clubbing, cyanosis, edema Neuro: CNs grossly intact, motor and sensory intact Skin: Limited exam, no lesions noted  DATA:   BMP Latest Ref Rng & Units 03/21/2018 03/18/2018 10/26/2016  Glucose 70 - 99 mg/dL 767(M) 094(B) 89  BUN 8 - 23 mg/dL 14 13 19   Creatinine 0.44 - 1.00 mg/dL 0.96(G)  8.36(O) 2.94(T)  Sodium 135 - 145 mmol/L 130(L) 134(L) 137  Potassium 3.5 - 5.1 mmol/L 4.3 4.7 4.1  Chloride 98 - 111 mmol/L 97(L) 99 102  CO2 22 - 32 mmol/L 26 25 27   Calcium 8.9 - 10.3 mg/dL 9.0 6.5(Y) 6.5(K)    CBC Latest Ref Rng & Units 03/22/2018 03/21/2018 03/18/2018  WBC 3.6 - 11.0 K/uL 4.3 6.7 8.7  Hemoglobin 12.0 - 16.0 g/dL 11.4(L) 12.2 12.5  Hematocrit 35.0 - 47.0 % 33.1(L) 35.5 36.4  Platelets 150 - 440 K/uL 174 193 220    CXR 8/13: Diffuse changes consistent with emphysema, moderate sized hiatal hernia, chronic blunting of right CP angle.  No acute findings  I have personally reviewed  all chest radiographs reported above including CXRs and CT chest unless otherwise indicated  IMPRESSION:     ICD-10-CM   1. COPD, suspect mod-severe. No PFTs available for my review J44.9 Pulmonary Function Test [facility] Only  2. Centrilobular emphysema (HCC) J43.2   3. Lung nodule R91.1   4. Kyphosis of thoracic region, unspecified kyphosis type M40.204    Given what appears to be very severe COPD and profound frailty, there is not a great deal to be gained from evaluating further the incidentally noted pulmonary nodule.  We tested her in the office today and she has very limited inspiratory capacity which probably means that inhalers are generally ineffective for her due to poor delivery of medication to her airways.  This contention is supported by her daughter who notes that she seems to get the most benefit from nebulized medications.  Her ventilatory mechanics are probably further impaired by moderate to severe thoracic kyphosis which is likely due to osteoporosis.  This raises concern regarding use of high-dose inhaled steroids.  PLAN:  1) stop Symbicort inhaler 2) initiate budesonide 0.25 mg nebulized twice a day 3) change DuoNeb to scheduled 3 or 4 times per day and as needed up to every 4 hours for increased shortness of breath, chest tightness, wheezing, cough 4) PFTs ordered.  She  might be unable to perform days due to severe frailty 5) I discussed with her and her daughter the importance of optimizing bone health.  She is already on vitamin D supplement.  I will defer to Dr. Judithann Sheen whether any further medical therapy might be indicated to prevent progression of thoracic kyphosis (noting my concern regarding its potential impact on her ventilatory mechanics) 6) follow-up in 4 to 6 weeks after PFTs performed.  She may call sooner as needed.   Billy Fischer, MD PCCM service Mobile [phone] Pager [phone] 04/18/2018 3:40 PM

## 2018-04-20 DIAGNOSIS — I10 Essential (primary) hypertension: Secondary | ICD-10-CM | POA: Diagnosis not present

## 2018-04-20 DIAGNOSIS — J431 Panlobular emphysema: Secondary | ICD-10-CM | POA: Diagnosis not present

## 2018-04-21 DIAGNOSIS — F419 Anxiety disorder, unspecified: Secondary | ICD-10-CM | POA: Diagnosis not present

## 2018-04-21 DIAGNOSIS — R918 Other nonspecific abnormal finding of lung field: Secondary | ICD-10-CM | POA: Diagnosis not present

## 2018-04-21 DIAGNOSIS — K219 Gastro-esophageal reflux disease without esophagitis: Secondary | ICD-10-CM | POA: Diagnosis not present

## 2018-04-21 DIAGNOSIS — E785 Hyperlipidemia, unspecified: Secondary | ICD-10-CM | POA: Diagnosis not present

## 2018-04-21 DIAGNOSIS — Z7951 Long term (current) use of inhaled steroids: Secondary | ICD-10-CM | POA: Diagnosis not present

## 2018-04-21 DIAGNOSIS — J9620 Acute and chronic respiratory failure, unspecified whether with hypoxia or hypercapnia: Secondary | ICD-10-CM | POA: Diagnosis not present

## 2018-04-21 DIAGNOSIS — I10 Essential (primary) hypertension: Secondary | ICD-10-CM | POA: Diagnosis not present

## 2018-04-21 DIAGNOSIS — I69354 Hemiplegia and hemiparesis following cerebral infarction affecting left non-dominant side: Secondary | ICD-10-CM | POA: Diagnosis not present

## 2018-04-21 DIAGNOSIS — J441 Chronic obstructive pulmonary disease with (acute) exacerbation: Secondary | ICD-10-CM | POA: Diagnosis not present

## 2018-04-24 DIAGNOSIS — J441 Chronic obstructive pulmonary disease with (acute) exacerbation: Secondary | ICD-10-CM | POA: Diagnosis not present

## 2018-04-24 DIAGNOSIS — R918 Other nonspecific abnormal finding of lung field: Secondary | ICD-10-CM | POA: Diagnosis not present

## 2018-04-24 DIAGNOSIS — I10 Essential (primary) hypertension: Secondary | ICD-10-CM | POA: Diagnosis not present

## 2018-04-24 DIAGNOSIS — Z7951 Long term (current) use of inhaled steroids: Secondary | ICD-10-CM | POA: Diagnosis not present

## 2018-04-24 DIAGNOSIS — F419 Anxiety disorder, unspecified: Secondary | ICD-10-CM | POA: Diagnosis not present

## 2018-04-24 DIAGNOSIS — K219 Gastro-esophageal reflux disease without esophagitis: Secondary | ICD-10-CM | POA: Diagnosis not present

## 2018-04-24 DIAGNOSIS — I69354 Hemiplegia and hemiparesis following cerebral infarction affecting left non-dominant side: Secondary | ICD-10-CM | POA: Diagnosis not present

## 2018-04-24 DIAGNOSIS — E785 Hyperlipidemia, unspecified: Secondary | ICD-10-CM | POA: Diagnosis not present

## 2018-04-24 DIAGNOSIS — J9620 Acute and chronic respiratory failure, unspecified whether with hypoxia or hypercapnia: Secondary | ICD-10-CM | POA: Diagnosis not present

## 2018-04-27 DIAGNOSIS — K219 Gastro-esophageal reflux disease without esophagitis: Secondary | ICD-10-CM | POA: Diagnosis not present

## 2018-04-27 DIAGNOSIS — Z7951 Long term (current) use of inhaled steroids: Secondary | ICD-10-CM | POA: Diagnosis not present

## 2018-04-27 DIAGNOSIS — E785 Hyperlipidemia, unspecified: Secondary | ICD-10-CM | POA: Diagnosis not present

## 2018-04-27 DIAGNOSIS — I69354 Hemiplegia and hemiparesis following cerebral infarction affecting left non-dominant side: Secondary | ICD-10-CM | POA: Diagnosis not present

## 2018-04-27 DIAGNOSIS — R918 Other nonspecific abnormal finding of lung field: Secondary | ICD-10-CM | POA: Diagnosis not present

## 2018-04-27 DIAGNOSIS — J441 Chronic obstructive pulmonary disease with (acute) exacerbation: Secondary | ICD-10-CM | POA: Diagnosis not present

## 2018-04-27 DIAGNOSIS — F419 Anxiety disorder, unspecified: Secondary | ICD-10-CM | POA: Diagnosis not present

## 2018-04-27 DIAGNOSIS — I10 Essential (primary) hypertension: Secondary | ICD-10-CM | POA: Diagnosis not present

## 2018-04-27 DIAGNOSIS — J9620 Acute and chronic respiratory failure, unspecified whether with hypoxia or hypercapnia: Secondary | ICD-10-CM | POA: Diagnosis not present

## 2018-05-01 DIAGNOSIS — K219 Gastro-esophageal reflux disease without esophagitis: Secondary | ICD-10-CM | POA: Diagnosis not present

## 2018-05-01 DIAGNOSIS — F419 Anxiety disorder, unspecified: Secondary | ICD-10-CM | POA: Diagnosis not present

## 2018-05-01 DIAGNOSIS — R918 Other nonspecific abnormal finding of lung field: Secondary | ICD-10-CM | POA: Diagnosis not present

## 2018-05-01 DIAGNOSIS — J9620 Acute and chronic respiratory failure, unspecified whether with hypoxia or hypercapnia: Secondary | ICD-10-CM | POA: Diagnosis not present

## 2018-05-01 DIAGNOSIS — J441 Chronic obstructive pulmonary disease with (acute) exacerbation: Secondary | ICD-10-CM | POA: Diagnosis not present

## 2018-05-01 DIAGNOSIS — Z7951 Long term (current) use of inhaled steroids: Secondary | ICD-10-CM | POA: Diagnosis not present

## 2018-05-01 DIAGNOSIS — I10 Essential (primary) hypertension: Secondary | ICD-10-CM | POA: Diagnosis not present

## 2018-05-01 DIAGNOSIS — I69354 Hemiplegia and hemiparesis following cerebral infarction affecting left non-dominant side: Secondary | ICD-10-CM | POA: Diagnosis not present

## 2018-05-01 DIAGNOSIS — E785 Hyperlipidemia, unspecified: Secondary | ICD-10-CM | POA: Diagnosis not present

## 2018-05-02 ENCOUNTER — Emergency Department: Payer: Medicare HMO

## 2018-05-02 ENCOUNTER — Encounter: Payer: Self-pay | Admitting: Emergency Medicine

## 2018-05-02 ENCOUNTER — Observation Stay
Admission: EM | Admit: 2018-05-02 | Discharge: 2018-05-04 | Disposition: A | Discharge status: Home Health | Payer: Medicare HMO | Attending: Internal Medicine | Admitting: Internal Medicine

## 2018-05-02 ENCOUNTER — Other Ambulatory Visit: Payer: Self-pay

## 2018-05-02 DIAGNOSIS — R410 Disorientation, unspecified: Secondary | ICD-10-CM | POA: Diagnosis not present

## 2018-05-02 DIAGNOSIS — Z96641 Presence of right artificial hip joint: Secondary | ICD-10-CM | POA: Diagnosis not present

## 2018-05-02 DIAGNOSIS — R05 Cough: Secondary | ICD-10-CM | POA: Diagnosis not present

## 2018-05-02 DIAGNOSIS — R627 Adult failure to thrive: Secondary | ICD-10-CM

## 2018-05-02 DIAGNOSIS — Z886 Allergy status to analgesic agent status: Secondary | ICD-10-CM | POA: Diagnosis not present

## 2018-05-02 DIAGNOSIS — Z87891 Personal history of nicotine dependence: Secondary | ICD-10-CM | POA: Diagnosis not present

## 2018-05-02 DIAGNOSIS — J449 Chronic obstructive pulmonary disease, unspecified: Secondary | ICD-10-CM | POA: Diagnosis present

## 2018-05-02 DIAGNOSIS — G629 Polyneuropathy, unspecified: Secondary | ICD-10-CM | POA: Diagnosis not present

## 2018-05-02 DIAGNOSIS — I714 Abdominal aortic aneurysm, without rupture: Secondary | ICD-10-CM | POA: Diagnosis not present

## 2018-05-02 DIAGNOSIS — Z880 Allergy status to penicillin: Secondary | ICD-10-CM | POA: Insufficient documentation

## 2018-05-02 DIAGNOSIS — E785 Hyperlipidemia, unspecified: Secondary | ICD-10-CM | POA: Insufficient documentation

## 2018-05-02 DIAGNOSIS — Z79899 Other long term (current) drug therapy: Secondary | ICD-10-CM | POA: Insufficient documentation

## 2018-05-02 DIAGNOSIS — Z9842 Cataract extraction status, left eye: Secondary | ICD-10-CM | POA: Insufficient documentation

## 2018-05-02 DIAGNOSIS — E7800 Pure hypercholesterolemia, unspecified: Secondary | ICD-10-CM | POA: Insufficient documentation

## 2018-05-02 DIAGNOSIS — R059 Cough, unspecified: Secondary | ICD-10-CM

## 2018-05-02 DIAGNOSIS — G9389 Other specified disorders of brain: Secondary | ICD-10-CM | POA: Diagnosis not present

## 2018-05-02 DIAGNOSIS — E860 Dehydration: Principal | ICD-10-CM | POA: Insufficient documentation

## 2018-05-02 DIAGNOSIS — Z882 Allergy status to sulfonamides status: Secondary | ICD-10-CM | POA: Diagnosis not present

## 2018-05-02 DIAGNOSIS — Z9841 Cataract extraction status, right eye: Secondary | ICD-10-CM | POA: Insufficient documentation

## 2018-05-02 DIAGNOSIS — F419 Anxiety disorder, unspecified: Secondary | ICD-10-CM | POA: Insufficient documentation

## 2018-05-02 DIAGNOSIS — K219 Gastro-esophageal reflux disease without esophagitis: Secondary | ICD-10-CM | POA: Diagnosis not present

## 2018-05-02 DIAGNOSIS — I10 Essential (primary) hypertension: Secondary | ICD-10-CM | POA: Diagnosis not present

## 2018-05-02 DIAGNOSIS — Z471 Aftercare following joint replacement surgery: Secondary | ICD-10-CM | POA: Diagnosis not present

## 2018-05-02 DIAGNOSIS — Z8673 Personal history of transient ischemic attack (TIA), and cerebral infarction without residual deficits: Secondary | ICD-10-CM | POA: Insufficient documentation

## 2018-05-02 DIAGNOSIS — E871 Hypo-osmolality and hyponatremia: Secondary | ICD-10-CM | POA: Diagnosis not present

## 2018-05-02 DIAGNOSIS — R4182 Altered mental status, unspecified: Secondary | ICD-10-CM

## 2018-05-02 DIAGNOSIS — Z8679 Personal history of other diseases of the circulatory system: Secondary | ICD-10-CM | POA: Insufficient documentation

## 2018-05-02 DIAGNOSIS — J441 Chronic obstructive pulmonary disease with (acute) exacerbation: Secondary | ICD-10-CM | POA: Diagnosis not present

## 2018-05-02 DIAGNOSIS — Z961 Presence of intraocular lens: Secondary | ICD-10-CM | POA: Diagnosis not present

## 2018-05-02 LAB — URINALYSIS, COMPLETE (UACMP) WITH MICROSCOPIC
BACTERIA UA: NONE SEEN
BILIRUBIN URINE: NEGATIVE
Glucose, UA: NEGATIVE mg/dL
Hgb urine dipstick: NEGATIVE
KETONES UR: NEGATIVE mg/dL
Leukocytes, UA: NEGATIVE
NITRITE: NEGATIVE
Protein, ur: NEGATIVE mg/dL
SPECIFIC GRAVITY, URINE: 1.017 (ref 1.005–1.030)
pH: 5 (ref 5.0–8.0)

## 2018-05-02 LAB — BASIC METABOLIC PANEL
ANION GAP: 9 (ref 5–15)
BUN: 18 mg/dL (ref 8–23)
CO2: 24 mmol/L (ref 22–32)
Calcium: 8.5 mg/dL — ABNORMAL LOW (ref 8.9–10.3)
Chloride: 95 mmol/L — ABNORMAL LOW (ref 98–111)
Creatinine, Ser: 1.08 mg/dL — ABNORMAL HIGH (ref 0.44–1.00)
GFR calc Af Amer: 55 mL/min — ABNORMAL LOW (ref >60)
GFR, EST NON AFRICAN AMERICAN: 47 mL/min — AB (ref >60)
GLUCOSE: 108 mg/dL — AB (ref 70–99)
POTASSIUM: 4.1 mmol/L (ref 3.5–5.1)
Sodium: 128 mmol/L — ABNORMAL LOW (ref 135–145)

## 2018-05-02 LAB — TROPONIN I

## 2018-05-02 LAB — CBC
HCT: 34.7 % — ABNORMAL LOW (ref 35.0–47.0)
HEMOGLOBIN: 12.1 g/dL (ref 12.0–16.0)
MCH: 29.1 pg (ref 26.0–34.0)
MCHC: 34.8 g/dL (ref 32.0–36.0)
MCV: 83.5 fL (ref 80.0–100.0)
Platelets: 223 10*3/uL (ref 150–440)
RBC: 4.15 MIL/uL (ref 3.80–5.20)
RDW: 14.7 % — AB (ref 11.5–14.5)
WBC: 7.3 10*3/uL (ref 3.6–11.0)

## 2018-05-02 MED ORDER — ACETAMINOPHEN 325 MG PO TABS
650.0000 mg | ORAL_TABLET | Freq: Once | ORAL | Status: AC
  Administered 2018-05-02: 650 mg via ORAL
  Filled 2018-05-02: qty 2

## 2018-05-02 NOTE — ED Provider Notes (Signed)
[facility] Emergency Department Provider Note  ___________________________________________   First MD Initiated Contact with Patient 05/02/18 2039     (approximate)  I have reviewed the triage vital signs and the nursing notes.   HISTORY  Chief Complaint Weakness   HPI Donna Quinn is a 82 y.o. female with a history of AAA as well as brain aneurysm and collagen vascular disease was presented to the emergency department today with altered mental status.  She is accompanied by her daughter says that the patient has been eating less since this past Friday.  Patient also less energetic.  Recent increase in her hydralazine from 25 3 times daily to 50 3 times daily.  Daughter says the patient was very confused today.  Says that the patient did not know what day the week it was.  Thought it was Sunday and got dressed for church.  Patient also committing of mild back pain to the bilateral, lateral lumbar region which she states is radiating to the top of her bilateral thighs.  Patient also states that she has been urinating less over the past several weeks.  Daughter says that the patient lives alone.   Past Medical History:  Diagnosis Date  . AAA (abdominal aortic aneurysm) (HCC)   . Brain aneurysm   . Collagen vascular disease (HCC)   . COPD (chronic obstructive pulmonary disease) (HCC)   . Dyspnea   . GERD (gastroesophageal reflux disease)   . H/O wheezing   . Hypercholesterolemia   . Hypertension   . Neuropathy   . Osteoporosis   . Stroke [facility]) 2016   twice    Patient Active Problem List   Diagnosis Date Noted  . Acute encephalopathy 05/02/2018  . Fracture of femoral neck, right (HCC) 10/19/2016  . HTN (hypertension) 10/19/2016  . HLD (hyperlipidemia) 10/19/2016  . COPD (chronic obstructive pulmonary disease) (HCC) 10/19/2016  . GERD (gastroesophageal reflux disease) 10/19/2016  . COPD exacerbation (HCC) 08/06/2016    Past Surgical History:    Procedure Laterality Date  . abdominal Bilateral    stents  . BACK SURGERY     cyst removed from back  . BRAIN SURGERY     Brain aneurysm repair, coiling  . CAROTID ENDARTERECTOMY    . CATARACT EXTRACTION W/PHACO Left 07/08/2016   Procedure: CATARACT EXTRACTION PHACO AND INTRAOCULAR LENS PLACEMENT (IOC);  Surgeon: Nevada Crane, MD;  Location: [facility] ORS;  Service: Ophthalmology;  Laterality: Left;  [place]  01:30 AP% 39.1 CDE 20.3 Fluid pack lot # [id number]  . CATARACT EXTRACTION W/PHACO Right 09/16/2016   Procedure: CATARACT EXTRACTION PHACO AND INTRAOCULAR LENS PLACEMENT (IOC);  Surgeon: Nevada Crane, MD;  Location: [facility] ORS;  Service: Ophthalmology;  Laterality: Right;  Lot # [id number] H [place]: 01"21.7 AP% 16.8: CDE: 13.90  . HIP ARTHROPLASTY Right 10/20/2016   Procedure: ARTHROPLASTY BIPOLAR HIP (HEMIARTHROPLASTY);  Surgeon: Christena Flake, MD;  Location: [facility] ORS;  Service: Orthopedics;  Laterality: Right;    Prior to Admission medications   Medication Sig Start Date End Date Taking? Authorizing Provider  acetaminophen (TYLENOL) 500 MG tablet Take 1 tablet (500 mg total) by mouth every 6 (six) hours as needed for moderate pain or headache. 10/22/16   Gouru, Deanna Artis, MD  albuterol (PROVENTIL HFA;VENTOLIN HFA) 108 (90 Base) MCG/ACT inhaler Inhale 2 puffs into the lungs every 6 (six) hours as needed for wheezing or shortness of breath.    [provider]  atorvastatin (LIPITOR) 40 MG tablet Take 40 mg  by mouth every evening.     [provider]  budesonide (PULMICORT) 0.25 MG/2ML nebulizer solution Take 2 mLs (0.25 mg total) by nebulization 2 (two) times daily. 04/18/18 04/18/19  Merwyn KatosSimonds, David B, MD  budesonide-formoterol (SYMBICORT) 160-4.5 MCG/ACT inhaler Inhale 2 puffs into the lungs 2 (two) times daily.    [provider]  clopidogrel (PLAVIX) 75 MG tablet Take 75 mg by mouth every morning.    [provider]  EASY-LAX PLUS 8.6-50 MG tablet Take 1 tablet by  mouth daily as needed. 03/31/18   [provider]  gabapentin (NEURONTIN) 300 MG capsule Take 300 mg by mouth 2 (two) times daily.    [provider]  hydrALAZINE (APRESOLINE) 25 MG tablet Take 25 mg by mouth 3 (three) times daily.     [provider]  ipratropium-albuterol (DUONEB) 0.5-2.5 (3) MG/3ML SOLN Inhale 3 mLs into the lungs 4 (four) times daily. 03/31/18   [provider]  losartan (COZAAR) 50 MG tablet Take 50 mg by mouth 2 (two) times daily.     [provider]  omeprazole (PRILOSEC) 20 MG capsule Take 20 mg by mouth 2 (two) times daily before a meal.    [provider]  vitamin B-12 (CYANOCOBALAMIN) 1000 MCG tablet Take 1,000 mcg by mouth daily.    [provider]  Vitamin D, Ergocalciferol, (DRISDOL) 50000 units CAPS capsule Take 1 capsule by mouth once a week. 02/28/18   [provider]    Allergies Aggrenox [aspirin-dipyridamole er]; Augmentin [amoxicillin-pot clavulanate]; Penicillins; Remeron [mirtazapine]; and Sulfa antibiotics  Family History  Problem Relation Age of Onset  . Breast cancer Sister [id number]  . Kidney disease Father     Social History Social History   Tobacco Use  . Smoking status: Former Smoker    Packs/day: 1.00    Years: 40.00    Pack years: 40.00    Types: Cigarettes  . Smokeless tobacco: Never Used  Substance Use Topics  . Alcohol use: No  . Drug use: No    Review of Systems  Constitutional: No fever/chills Eyes: No visual changes. ENT: No sore throat. Cardiovascular: Denies chest pain. Respiratory: Denies shortness of breath. Gastrointestinal: No abdominal pain.  No nausea, no vomiting.  No diarrhea.  No constipation. Genitourinary: As above Musculoskeletal: As above Skin: Negative for rash. Neurological: Negative for headaches, focal weakness or numbness.   ____________________________________________   PHYSICAL EXAM:  VITAL SIGNS: ED Triage Vitals [05/02/18  1918]  Enc Vitals Group     BP (!) 152/93     Pulse Rate 66     Resp 18     Temp 98.5 F (36.9 C)     Temp Source Oral     SpO2 97 %     Weight 134 lb 0.6 oz (60.8 kg)     Height 5\' 6"  (1.676 m)     Head Circumference      Peak Flow      Pain Score 6     Pain Loc      Pain Edu?      Excl. in GC?     Constitutional: Alert and oriented to location, self, date and day of the week but not the year.  Patient's daughter at bedside says that this is not normal for the patient do not know the year. Well appearing and in no acute distress. Eyes: Conjunctivae are normal.  Head: Atraumatic. Nose: No congestion/rhinnorhea. Mouth/Throat: Mucous membranes are moist.  Neck: No stridor.  Cardiovascular: Normal rate, regular rhythm. Grossly normal heart sounds.  Good peripheral circulation with equal and bilateral radial pulses. Respiratory: Normal respiratory effort.  No retractions. Lungs CTAB. Gastrointestinal: Soft and nontender. No distention. No CVA tenderness.  No pulsatile mass palpated to the abdomen. Musculoskeletal: No lower extremity tenderness nor edema.  No joint effusions.  Negative straight leg raise bilaterally.  However, when I raise the patient's legs bilaterally says that she has pain anteriorly to thighs.  Very minimal tenderness over the bilateral, lateral ala of the posterior pelvis.  No midline tenderness to palpation.  No deformity or step-off. Neurologic:  Normal speech and language. No gross focal neurologic deficits are appreciated. Skin:  Skin is warm, dry and intact. No rash noted. Psychiatric: Mood and affect are normal. Speech and behavior are normal.  ____________________________________________   LABS (all labs ordered are listed, but only abnormal results are displayed)  Labs Reviewed  BASIC METABOLIC PANEL - Abnormal; Notable for the following components:      Result Value   Sodium 128 (*)    Chloride 95 (*)    Glucose, Bld 108 (*)    Creatinine, Ser  1.08 (*)    Calcium 8.5 (*)    GFR calc non Af Amer 47 (*)    GFR calc Af Amer 55 (*)    All other components within normal limits  CBC - Abnormal; Notable for the following components:   HCT 34.7 (*)    RDW 14.7 (*)    All other components within normal limits  URINALYSIS, COMPLETE (UACMP) WITH MICROSCOPIC - Abnormal; Notable for the following components:   Color, Urine YELLOW (*)    APPearance CLEAR (*)    All other components within normal limits  TROPONIN I   ____________________________________________  EKG  ED ECG REPORT I, Arelia Longest, the attending physician, personally viewed and interpreted this ECG.   Date: 05/02/2018  EKG Time: 1925  Rate: 69  Rhythm: normal sinus rhythm  Axis: Normal  Intervals:Incomplete right bundle branch block with a left anterior fascicular block.  ST&T Change: No ST segment elevation or depression.  No abnormal T wave inversion. No significant change from previous ____________________________________________  RADIOLOGY  No acute finding on the CT of the brain, chest x-ray nor pelvis x-ray. ____________________________________________   PROCEDURES  Procedure(s) performed:   Procedures  Critical Care performed:   ____________________________________________   INITIAL IMPRESSION / ASSESSMENT AND PLAN / ED COURSE  Pertinent labs & imaging results that were available during my care of the patient were reviewed by me and considered in my medical decision making (see chart for details).  Differential diagnosis includes, but is not limited to, alcohol, illicit or prescription medications, or other toxic ingestion; intracranial pathology such as stroke or intracerebral hemorrhage; fever or infectious causes including sepsis; hypoxemia and/or hypercarbia; uremia; trauma; endocrine related disorders such as diabetes, hypoglycemia, and thyroid-related diseases; hypertensive encephalopathy; etc. As part of my medical decision making,  I reviewed the following data within the electronic MEDICAL RECORD NUMBER Notes from prior ED visits  ----------------------------------------- 11:55 PM on 05/02/2018 -----------------------------------------  Patient with failure to thrive with decreased appetite and now confusion.  Sodium of 128 as well which appears to be trending down consistently.  Will be admitted to the hospital.  Signed out to Dr. Anne Hahn.  Patient and family aware of diagnosis as well as treatment and willing to comply. ____________________________________________   FINAL CLINICAL IMPRESSION(S) / ED DIAGNOSES  Altered mental status.  Hyponatremia.  Failure to thrive.    NEW MEDICATIONS STARTED DURING THIS VISIT:  New Prescriptions   No medications on file     Note:  This document was prepared using Dragon voice recognition software and may include unintentional dictation errors.     Myrna Blazer, MD 05/02/18 [phone]

## 2018-05-02 NOTE — ED Notes (Signed)
MD at bedside. 

## 2018-05-02 NOTE — ED Triage Notes (Signed)
Pt to triage via w/c with no distress noted; seen Dr Judithann SheenSparks yesterday for HTN--med changes were made, decreased intake since Friday with confusion and weakness; pt c/o lower back pain today but denies any other c/o

## 2018-05-03 ENCOUNTER — Other Ambulatory Visit: Payer: Self-pay

## 2018-05-03 ENCOUNTER — Observation Stay (HOSPITAL_BASED_OUTPATIENT_CLINIC_OR_DEPARTMENT_OTHER)
Admit: 2018-05-03 | Discharge: 2018-05-03 | Disposition: A | Discharge status: Home / Self Care | Payer: Medicare HMO | Attending: Internal Medicine | Admitting: Internal Medicine

## 2018-05-03 DIAGNOSIS — E871 Hypo-osmolality and hyponatremia: Secondary | ICD-10-CM | POA: Diagnosis not present

## 2018-05-03 DIAGNOSIS — J449 Chronic obstructive pulmonary disease, unspecified: Secondary | ICD-10-CM | POA: Diagnosis not present

## 2018-05-03 DIAGNOSIS — I5030 Unspecified diastolic (congestive) heart failure: Secondary | ICD-10-CM

## 2018-05-03 DIAGNOSIS — E860 Dehydration: Secondary | ICD-10-CM | POA: Diagnosis not present

## 2018-05-03 DIAGNOSIS — I10 Essential (primary) hypertension: Secondary | ICD-10-CM | POA: Diagnosis not present

## 2018-05-03 LAB — BASIC METABOLIC PANEL
ANION GAP: 4 — AB (ref 5–15)
BUN: 16 mg/dL (ref 8–23)
CALCIUM: 8.1 mg/dL — AB (ref 8.9–10.3)
CO2: 27 mmol/L (ref 22–32)
Chloride: 97 mmol/L — ABNORMAL LOW (ref 98–111)
Creatinine, Ser: 1.1 mg/dL — ABNORMAL HIGH (ref 0.44–1.00)
GFR, EST AFRICAN AMERICAN: 53 mL/min — AB (ref >60)
GFR, EST NON AFRICAN AMERICAN: 46 mL/min — AB (ref >60)
Glucose, Bld: 100 mg/dL — ABNORMAL HIGH (ref 70–99)
Potassium: 3.4 mmol/L — ABNORMAL LOW (ref 3.5–5.1)
Sodium: 128 mmol/L — ABNORMAL LOW (ref 135–145)

## 2018-05-03 LAB — CBC
HCT: 30.6 % — ABNORMAL LOW (ref 35.0–47.0)
HEMOGLOBIN: 10.8 g/dL — AB (ref 12.0–16.0)
MCH: 29.6 pg (ref 26.0–34.0)
MCHC: 35.4 g/dL (ref 32.0–36.0)
MCV: 83.5 fL (ref 80.0–100.0)
PLATELETS: 182 10*3/uL (ref 150–440)
RBC: 3.67 MIL/uL — ABNORMAL LOW (ref 3.80–5.20)
RDW: 14.4 % (ref 11.5–14.5)
WBC: 5.6 10*3/uL (ref 3.6–11.0)

## 2018-05-03 LAB — ECHOCARDIOGRAM COMPLETE
Height: 66 in
Weight: 2038.81 oz

## 2018-05-03 MED ORDER — ONDANSETRON HCL 4 MG/2ML IJ SOLN: 4.0000 mg | Freq: Four times a day (QID) | INTRAMUSCULAR | Status: DC | PRN

## 2018-05-03 MED ORDER — LOSARTAN POTASSIUM 50 MG PO TABS
50.0000 mg | ORAL_TABLET | Freq: Two times a day (BID) | ORAL | Status: DC
  Administered 2018-05-03 – 2018-05-04 (×3): 50 mg via ORAL
  Filled 2018-05-03 (×3): qty 1

## 2018-05-03 MED ORDER — POTASSIUM CHLORIDE CRYS ER 20 MEQ PO TBCR
40.0000 meq | EXTENDED_RELEASE_TABLET | Freq: Once | ORAL | Status: AC
  Administered 2018-05-03: 10:00:00 40 meq via ORAL
  Filled 2018-05-03: qty 2

## 2018-05-03 MED ORDER — ACETAMINOPHEN 325 MG PO TABS
650.0000 mg | ORAL_TABLET | Freq: Four times a day (QID) | ORAL | Status: DC | PRN
  Administered 2018-05-04: 11:00:00 650 mg via ORAL
  Filled 2018-05-03: qty 2

## 2018-05-03 MED ORDER — PANTOPRAZOLE SODIUM 40 MG PO TBEC
40.0000 mg | DELAYED_RELEASE_TABLET | Freq: Every day | ORAL | Status: DC
  Administered 2018-05-03 – 2018-05-04 (×2): 40 mg via ORAL
  Filled 2018-05-03 (×2): qty 1

## 2018-05-03 MED ORDER — ACETAMINOPHEN 650 MG RE SUPP: 650.0000 mg | Freq: Four times a day (QID) | RECTAL | Status: DC | PRN

## 2018-05-03 MED ORDER — ONDANSETRON HCL 4 MG PO TABS: 4.0000 mg | ORAL_TABLET | Freq: Four times a day (QID) | ORAL | Status: DC | PRN

## 2018-05-03 MED ORDER — AMLODIPINE BESYLATE 5 MG PO TABS
5.0000 mg | ORAL_TABLET | Freq: Every day | ORAL | Status: DC
  Administered 2018-05-03 – 2018-05-04 (×2): 5 mg via ORAL
  Filled 2018-05-03 (×2): qty 1

## 2018-05-03 MED ORDER — ENOXAPARIN SODIUM 40 MG/0.4ML ~~LOC~~ SOLN
40.0000 mg | SUBCUTANEOUS | Status: DC
  Administered 2018-05-03: 40 mg via SUBCUTANEOUS
  Filled 2018-05-03: qty 0.4

## 2018-05-03 MED ORDER — ATORVASTATIN CALCIUM 20 MG PO TABS
40.0000 mg | ORAL_TABLET | Freq: Every evening | ORAL | Status: DC
  Administered 2018-05-03: 40 mg via ORAL
  Filled 2018-05-03: qty 2

## 2018-05-03 MED ORDER — IPRATROPIUM-ALBUTEROL 0.5-2.5 (3) MG/3ML IN SOLN
3.0000 mL | Freq: Four times a day (QID) | RESPIRATORY_TRACT | Status: DC
  Administered 2018-05-03 – 2018-05-04 (×6): 3 mL via RESPIRATORY_TRACT
  Filled 2018-05-03 (×6): qty 3

## 2018-05-03 MED ORDER — CLOPIDOGREL BISULFATE 75 MG PO TABS
75.0000 mg | ORAL_TABLET | ORAL | Status: DC
  Administered 2018-05-03 – 2018-05-04 (×2): 75 mg via ORAL
  Filled 2018-05-03 (×2): qty 1

## 2018-05-03 MED ORDER — METOPROLOL SUCCINATE ER 25 MG PO TB24: 25.0000 mg | ORAL_TABLET | Freq: Every day | ORAL | Status: DC

## 2018-05-03 MED ORDER — ENSURE ENLIVE PO LIQD
237.0000 mL | Freq: Two times a day (BID) | ORAL | Status: DC
  Administered 2018-05-03: 237 mL via ORAL

## 2018-05-03 MED ORDER — SODIUM CHLORIDE 0.9 % IV SOLN
INTRAVENOUS | Status: AC
  Administered 2018-05-03: 02:00:00 via INTRAVENOUS

## 2018-05-03 MED ORDER — LORAZEPAM 1 MG PO TABS
1.0000 mg | ORAL_TABLET | Freq: Every evening | ORAL | Status: DC | PRN
  Administered 2018-05-03: 1 mg via ORAL
  Filled 2018-05-03: qty 1

## 2018-05-03 MED ORDER — METHYLPREDNISOLONE SODIUM SUCC 125 MG IJ SOLR
60.0000 mg | INTRAMUSCULAR | Status: DC
  Administered 2018-05-03: 13:00:00 60 mg via INTRAVENOUS
  Filled 2018-05-03: qty 2

## 2018-05-03 MED ORDER — HYDRALAZINE HCL 50 MG PO TABS
50.0000 mg | ORAL_TABLET | Freq: Three times a day (TID) | ORAL | Status: DC
  Administered 2018-05-03 – 2018-05-04 (×4): 50 mg via ORAL
  Filled 2018-05-03 (×4): qty 1

## 2018-05-03 NOTE — H&P (Signed)
[facility] - [place] [facility]   PATIENT NAME: Donna Quinn    MR#:  [id number]  DATE OF BIRTH:  1936-02-04  DATE OF ADMISSION:  05/02/2018  PRIMARY CARE PHYSICIAN: Marguarite Arbour, MD   REQUESTING/REFERRING PHYSICIAN: Pershing Proud, MD  CHIEF COMPLAINT:   Chief Complaint  Patient presents with  . Weakness    HISTORY OF PRESENT ILLNESS:  Donna Quinn  is a 82 y.o. female who presents with chief complaint as above.  Patient has had decreased p.o. intake at home with generalized weakness and some confusion today.  She came to ED for evaluation.  Work-up appears largely within normal limits except for some very mild hyponatremia with a sodium of 128.  However, she is clinically dehydrated.  Hospitalist were called for admission  PAST MEDICAL HISTORY:   Past Medical History:  Diagnosis Date  . AAA (abdominal aortic aneurysm) (HCC)   . Brain aneurysm   . Collagen vascular disease (HCC)   . COPD (chronic obstructive pulmonary disease) (HCC)   . Dyspnea   . GERD (gastroesophageal reflux disease)   . H/O wheezing   . Hypercholesterolemia   . Hypertension   . Neuropathy   . Osteoporosis   . Stroke [facility]) 2016   twice     PAST SURGICAL HISTORY:   Past Surgical History:  Procedure Laterality Date  . abdominal Bilateral    stents  . BACK SURGERY     cyst removed from back  . BRAIN SURGERY     Brain aneurysm repair, coiling  . CAROTID ENDARTERECTOMY    . CATARACT EXTRACTION W/PHACO Left 07/08/2016   Procedure: CATARACT EXTRACTION PHACO AND INTRAOCULAR LENS PLACEMENT (IOC);  Surgeon: Nevada Crane, MD;  Location: [facility] ORS;  Service: Ophthalmology;  Laterality: Left;  [place]  01:30 AP% 39.1 CDE 20.3 Fluid pack lot # [id number]  . CATARACT EXTRACTION W/PHACO Right 09/16/2016   Procedure: CATARACT EXTRACTION PHACO AND INTRAOCULAR LENS PLACEMENT (IOC);  Surgeon: Nevada Crane, MD;  Location: [facility] ORS;  Service: Ophthalmology;  Laterality: Right;   Lot # [id number] H [place]: 01"21.7 AP% 16.8: CDE: 13.90  . HIP ARTHROPLASTY Right 10/20/2016   Procedure: ARTHROPLASTY BIPOLAR HIP (HEMIARTHROPLASTY);  Surgeon: Christena Flake, MD;  Location: [facility] ORS;  Service: Orthopedics;  Laterality: Right;     SOCIAL HISTORY:   Social History   Tobacco Use  . Smoking status: Former Smoker    Packs/day: 1.00    Years: 40.00    Pack years: 40.00    Types: Cigarettes  . Smokeless tobacco: Never Used  Substance Use Topics  . Alcohol use: No     FAMILY HISTORY:   Family History  Problem Relation Age of Onset  . Breast cancer Sister 75  . Kidney disease Father      DRUG ALLERGIES:   Allergies  Allergen Reactions  . Aggrenox [Aspirin-Dipyridamole Er]     A really bad headache  . Augmentin [Amoxicillin-Pot Clavulanate]     unknown  . Penicillins Other (See Comments)    Has patient had a PCN reaction causing immediate rash, facial/tongue/throat swelling, SOB or lightheadedness with hypotension: Yes Has patient had a PCN reaction causing severe rash involving mucus membranes or skin necrosis: Yes Has patient had a PCN reaction that required hospitalization No Has patient had a PCN reaction occurring within the last 10 years: No If all of the above answers are "NO", then may proceed with Cephalosporin use.  . Remeron [Mirtazapine]     Broke  out inside the mouth  . Sulfa Antibiotics     Broke out in welps and had to be hospitalized    MEDICATIONS AT HOME:   Prior to Admission medications   Medication Sig Start Date End Date Taking? Authorizing Provider  LORazepam (ATIVAN) 1 MG tablet Take 1 mg by mouth at bedtime as needed for anxiety or sleep. 05/01/18  Yes [provider]  acetaminophen (TYLENOL) 500 MG tablet Take 1 tablet (500 mg total) by mouth every 6 (six) hours as needed for moderate pain or headache. 10/22/16   Gouru, Deanna Artis, MD  albuterol (PROVENTIL HFA;VENTOLIN HFA) 108 (90 Base) MCG/ACT inhaler Inhale 2 puffs into the lungs  every 6 (six) hours as needed for wheezing or shortness of breath.    [provider]  atorvastatin (LIPITOR) 40 MG tablet Take 40 mg by mouth every evening.     [provider]  budesonide (PULMICORT) 0.25 MG/2ML nebulizer solution Take 2 mLs (0.25 mg total) by nebulization 2 (two) times daily. 04/18/18 04/18/19  Merwyn Katos, MD  budesonide-formoterol (SYMBICORT) 160-4.5 MCG/ACT inhaler Inhale 2 puffs into the lungs 2 (two) times daily.    [provider]  clopidogrel (PLAVIX) 75 MG tablet Take 75 mg by mouth every morning.    [provider]  EASY-LAX PLUS 8.6-50 MG tablet Take 1 tablet by mouth daily as needed. 03/31/18   [provider]  gabapentin (NEURONTIN) 300 MG capsule Take 300 mg by mouth 2 (two) times daily.    [provider]  hydrALAZINE (APRESOLINE) 25 MG tablet Take 25 mg by mouth 3 (three) times daily.     [provider]  ipratropium-albuterol (DUONEB) 0.5-2.5 (3) MG/3ML SOLN Inhale 3 mLs into the lungs 4 (four) times daily. 03/31/18   [provider]  losartan (COZAAR) 50 MG tablet Take 50 mg by mouth 2 (two) times daily.     [provider]  omeprazole (PRILOSEC) 20 MG capsule Take 20 mg by mouth 2 (two) times daily before a meal.    [provider]  vitamin B-12 (CYANOCOBALAMIN) 1000 MCG tablet Take 1,000 mcg by mouth daily.    [provider]  Vitamin D, Ergocalciferol, (DRISDOL) 50000 units CAPS capsule Take 1 capsule by mouth once a week. 02/28/18   [provider]    REVIEW OF SYSTEMS:  Review of Systems  Constitutional: Positive for malaise/fatigue. Negative for chills, fever and weight loss.  HENT: Negative for ear pain, hearing loss and tinnitus.   Eyes: Negative for blurred vision, double vision, pain and redness.  Respiratory: Negative for cough, hemoptysis and shortness of breath.   Cardiovascular: Negative for chest pain, palpitations, orthopnea and leg  swelling.  Gastrointestinal: Negative for abdominal pain, constipation, diarrhea, nausea and vomiting.  Genitourinary: Negative for dysuria, frequency and hematuria.  Musculoskeletal: Negative for back pain, joint pain and neck pain.  Skin:       No acne, rash, or lesions  Neurological: Positive for weakness. Negative for dizziness, tremors and focal weakness.  Endo/Heme/Allergies: Negative for polydipsia. Does not bruise/bleed easily.  Psychiatric/Behavioral: Negative for depression. The patient is not nervous/anxious and does not have insomnia.      VITAL SIGNS:   Vitals:   05/02/18 1918 05/02/18 2233 05/02/18 2235  BP: (!) 152/93 (!) 183/67   Pulse: 66 (!) 36 (!) 36  Resp: 18 18   Temp: 98.5 F (36.9 C)    TempSrc: Oral    SpO2: 97% 96% 96%  Weight: 60.8  kg    Height: 5\' 6"  (1.676 m)     Wt Readings from Last 3 Encounters:  05/02/18 60.8 kg  04/18/18 60.8 kg  03/21/18 62.1 kg    PHYSICAL EXAMINATION:  Physical Exam  Vitals reviewed. Constitutional: She is oriented to person, place, and time. She appears well-developed and well-nourished. No distress.  HENT:  Head: Normocephalic and atraumatic.  Mouth/Throat: Oropharynx is clear and moist.  Eyes: Pupils are equal, round, and reactive to light. Conjunctivae and EOM are normal. No scleral icterus.  Neck: Normal range of motion. Neck supple. No JVD present. No thyromegaly present.  Cardiovascular: Normal rate, regular rhythm and intact distal pulses. Exam reveals no gallop and no friction rub.  No murmur heard. Respiratory: Effort normal and breath sounds normal. No respiratory distress. She has no wheezes. She has no rales.  GI: Soft. Bowel sounds are normal. She exhibits no distension. There is no tenderness.  Musculoskeletal: Normal range of motion. She exhibits no edema.  No arthritis, no gout  Lymphadenopathy:    She has no cervical adenopathy.  Neurological: She is alert and oriented to person, place, and time. No  cranial nerve deficit.  No dysarthria, no aphasia  Skin: Skin is warm and dry. No rash noted. No erythema.  Psychiatric: She has a normal mood and affect. Her behavior is normal. Judgment and thought content normal.    LABORATORY PANEL:   CBC Recent Labs  Lab 05/02/18 1920  WBC 7.3  HGB 12.1  HCT 34.7*  PLT 223   ------------------------------------------------------------------------------------------------------------------  Chemistries  Recent Labs  Lab 05/02/18 1920  NA 128*  K 4.1  CL 95*  CO2 24  GLUCOSE 108*  BUN 18  CREATININE 1.08*  CALCIUM 8.5*   ------------------------------------------------------------------------------------------------------------------  Cardiac Enzymes Recent Labs  Lab 05/02/18 1920  TROPONINI <0.03   ------------------------------------------------------------------------------------------------------------------  RADIOLOGY:  Dg Chest 1 View  Result Date: 05/02/2018 CLINICAL DATA:  82 year old female with cough. EXAM: CHEST  1 VIEW COMPARISON:  Chest CT dated 03/21/2018 FINDINGS: The lungs are clear. There is no pleural effusion or pneumothorax. The cardiac silhouette is within normal limits. Hiatal hernia. No acute osseous pathology. IMPRESSION: No active disease. Electronically Signed   By: Elgie Collard M.D.   On: 05/02/2018 22:24   Dg Pelvis 1-2 Views  Result Date: 05/02/2018 CLINICAL DATA:  82 year old female with pelvic pain. EXAM: PELVIS - 1-2 VIEW COMPARISON:  None. FINDINGS: There is a right hip replacement. The arthroplasty component appears intact. There is an apparent 2 mm gap between the superior and medial portion of the acetabular portion of the arthroplasty and the bony acetabulum. There is no acute fracture or dislocation. The bones are osteopenic. Atherosclerotic calcification of the vasculature and an aorto bi iliac endovascular stent graft repair. The soft tissues are grossly unremarkable. IMPRESSION: 1. No  acute fracture or dislocation. 2. Intact right hip arthroplasty. Electronically Signed   By: Elgie Collard M.D.   On: 05/02/2018 22:27   Ct Head Wo Contrast  Result Date: 05/02/2018 CLINICAL DATA:  Acute confusion.  No reported injury. EXAM: CT HEAD WITHOUT CONTRAST TECHNIQUE: Contiguous axial images were obtained from the base of the skull through the vertex without intravenous contrast. COMPARISON:  10/19/2016 head CT. FINDINGS: Brain: Aneurysm coil is noted in the left suprasellar region. Small focus of encephalomalacia in the left occipital lobe and in the inferior left cerebellar hemisphere. No evidence of parenchymal hemorrhage or extra-axial fluid collection. No mass lesion, mass effect, or midline shift. No  CT evidence of acute infarction. Nonspecific moderate subcortical and periventricular white matter hypodensity, most in keeping with chronic small vessel ischemic change. Cerebral volume is age appropriate. No ventriculomegaly. Vascular: No acute abnormality. Skull: No evidence of calvarial fracture. Sinuses/Orbits: The visualized paranasal sinuses are essentially clear. Other:  The mastoid air cells are unopacified. IMPRESSION: 1.  No evidence of acute intracranial abnormality. 2. Left occipital lobe and inferior left cerebellar hemisphere foci of encephalomalacia. 3. Moderate chronic small vessel ischemic changes in the cerebral white matter. Electronically Signed   By: Delbert Phenix M.D.   On: 05/02/2018 21:53    EKG:   Orders placed or performed during the hospital encounter of 05/02/18  . ED EKG  . ED EKG    IMPRESSION AND PLAN:  Principal Problem:   Dehydration -gentle IV fluids tonight Active Problems:   Hyponatremia -start with hydration tonight as above, recheck sodium in the morning   HTN (hypertension) -continue home meds   COPD (chronic obstructive pulmonary disease) (HCC) -home dose inhalers   HLD (hyperlipidemia) -Home dose antilipid   GERD (gastroesophageal reflux  disease) -home dose PPI  Chart review performed and case discussed with ED provider. Labs, imaging and/or ECG reviewed by provider and discussed with patient/family. Management plans discussed with the patient and/or family.  DVT PROPHYLAXIS: SubQ lovenox   GI PROPHYLAXIS:  PPI   ADMISSION STATUS: Observation  CODE STATUS: Full Code Status History    Date Active Date Inactive Code Status Order ID Comments User Context   03/21/2018 1958 03/23/2018 1509 Full Code [id number]  Houston Siren, MD Inpatient   10/19/2016 2151 10/22/2016 1835 Full Code [id number]  Oralia Manis, MD ED   10/19/2016 2047 10/19/2016 2047 Full Code [id number]  Juanell Fairly, MD ED   08/06/2016 1744 08/08/2016 1637 Full Code [id number]  Milagros Loll, MD ED      TOTAL TIME TAKING CARE OF THIS PATIENT: 40 minutes.   Marvelene Stoneberg FIELDING 05/03/2018, 12:06 AM  [facility]  Office  [phone]  CC: Primary care physician; Marguarite Arbour, MD  Note:  This document was prepared using Dragon voice recognition software and may include unintentional dictation errors.

## 2018-05-03 NOTE — Progress Notes (Signed)
Physical Therapy Evaluation Patient Details Name: Donna Quinn MRN: [id number] DOB: [birth year] Today's Date: 05/03/2018   History of Present Illness  Donna Quinn  is a 82 y.o. female who presented with decreased p.o. intake at home with generalized weakness and some confusion today.  She came to ED for evaluation.  Work-up appears largely within normal limits except for some very mild hyponatremia with a sodium of 128.  However, she is clinically dehydrated.  Hospitalist were called for admission and she is now admitted under observation for dehydration.  Clinical Impression  Pt admitted with above diagnosis. Pt currently with functional limitations due to the deficits listed below (see PT Problem List). Pt requires trunk and LE assist to come up to sitting at EOB. Patient transfers with safe hand placement. Slow speed but stable in standing. Pt is able to ambulate partially around RN station with therapist. Gait speed is slow however pt is stable with use of rolling walker. Denies DOE. She is currently receiving HH PT at home and should resume at discharge. Pt will benefit from PT services to address deficits in strength, balance, and mobility in order to return to full function at home.     Follow Up Recommendations Home health PT;Other (comment);Supervision - Intermittent(Resume HH PT at discharge)    Equipment Recommendations  None recommended by PT;Other (comment)(Continue using walker at discharge)    Recommendations for Other Services       Precautions / Restrictions Precautions Precautions: Fall Restrictions Weight Bearing Restrictions: No      Mobility  Bed Mobility Overal bed mobility: Needs Assistance Bed Mobility: Supine to Sit     Supine to sit: Min assist     General bed mobility comments: Pt requires trunk and LE assist to come up to sitting at EOB  Transfers Overall transfer level: Needs assistance Equipment used: Rolling walker (2 wheeled) Transfers: Sit  to/from Stand Sit to Stand: Min guard         General transfer comment: Patient transfers with safe hand placement. Slow speed but stable in standing  Ambulation/Gait Ambulation/Gait assistance: Min guard Gait Distance (Feet): 120 Feet Assistive device: Rolling walker (2 wheeled)       General Gait Details: Pt is able to ambulate partially around RN station with therapist. Gait speed is slow however pt is stable with use of rolling walker. Denies DOE  Information systems manager [place] (Stroke Patients Only)       Balance Overall balance assessment: Needs assistance Sitting-balance support: No upper extremity supported Sitting balance-Leahy Scale: Good     Standing balance support: No upper extremity supported Standing balance-Leahy Scale: Fair Standing balance comment: Pt requires assist to place feet together and maintain balance                             Pertinent Vitals/Pain Pain Assessment: No/denies pain    Home Living Family/patient expects to be discharged to:: Private residence Living Arrangements: Alone Available Help at Discharge: Family;Other (Comment)(Daughter lives up the road from patient) Type of Home: House Home Access: Stairs to enter Entrance Stairs-Rails: Can reach both Entrance Stairs-Number of Steps: 1 step, landing, 1 step Home Layout: Two level;Able to live on main level with bedroom/bathroom Home Equipment: Dan Humphreys - 2 wheels;Cane - single point;Grab bars - tub/shower;Shower seat;Bedside commode      Prior Function Level of Independence: Needs  assistance   Gait / Transfers Assistance Needed: Ambulates with rolling walker  ADL's / Homemaking Assistance Needed: Independent with ADLs but assist required for IADLs  Comments: Pt has not had a fall since hip fx last year, minimal in home activity but able to get out of the home with family near daily     Hand Dominance        Extremity/Trunk  Assessment   Upper Extremity Assessment Upper Extremity Assessment: Overall WFL for tasks assessed    Lower Extremity Assessment Lower Extremity Assessment: Generalized weakness       Communication   Communication: No difficulties  Cognition Arousal/Alertness: Awake/alert Behavior During Therapy: WFL for tasks assessed/performed Overall Cognitive Status: Within Functional Limits for tasks assessed                                        General Comments      Exercises     Assessment/Plan    PT Assessment Patient needs continued PT services  PT Problem List Decreased strength;Decreased activity tolerance;Decreased balance;Decreased mobility       PT Treatment Interventions DME instruction;Gait training;Functional mobility training;Therapeutic activities;Balance training;Therapeutic exercise;Neuromuscular re-education    PT Goals (Current goals can be found in the Care Plan section)  Acute Rehab PT Goals Patient Stated Goal: Return to prior function at home PT Goal Formulation: With patient Time For Goal Achievement: 05/17/18 Potential to Achieve Goals: Good    Frequency Min 2X/week   Barriers to discharge Decreased caregiver support Lives alone    Co-evaluation               AM-PAC PT "6 Clicks" Daily Activity  Outcome Measure Difficulty turning over in bed (including adjusting bedclothes, sheets and blankets)?: A Little Difficulty moving from lying on back to sitting on the side of the bed? : Unable Difficulty sitting down on and standing up from a chair with arms (e.g., wheelchair, bedside commode, etc,.)?: A Little Help needed moving to and from a bed to chair (including a wheelchair)?: A Little Help needed walking in hospital room?: A Little Help needed climbing 3-5 steps with a railing? : A Lot 6 Click Score: 15    End of Session Equipment Utilized During Treatment: Gait belt Activity Tolerance: Patient tolerated treatment  well Patient left: in bed;with call bell/phone within reach;with bed alarm set   PT Visit Diagnosis: Unsteadiness on feet (R26.81);Muscle weakness (generalized) (M62.81);History of falling (Z91.81)    Time: 4098-1191 PT Time Calculation (min) (ACUTE ONLY): 16 min   Charges:   PT Evaluation $PT Eval Low Complexity: 1 Low          Conya Ellinwood D Malori Myers PT, DPT, GCS   Eleri Ruben 05/03/2018, 1:03 PM

## 2018-05-03 NOTE — Care Management Note (Signed)
Case Management Note  Patient Details  Name: Donna Quinn MRN: [id number] Date of Birth: 03/15/1936  Subjective/Objective: Admitted to [facility] under observation status with diagnosis of dehydration. Lives alone. Daughter is Keene Breathnita Kidd [phone]). Last seen Dr Judithann SheenSparks 05/01/18. Prescriptions are filled at [facility] [place]. Currently receiving home health prt [facility]. No skilled nursing. No home oxygen, Takes care of all basic activities of daily living herself, doesn't drive. Family helps with errands. No falls. Fair appetite.                   Action/Plan: Currently receiving home health services per [facility].   Expected Discharge Date:                  Expected Discharge Plan:     In-House Referral:     Discharge planning Services     Post Acute Care Choice:    Choice offered to:     DME Arranged:    DME Agency:     HH Arranged:    HH Agency:     Status of Service:     If discussed at [employer] Meetings, dates discussed:    Additional Comments:  Gwenette GreetBrenda S Mirabella Hilario, RN MSN CCM Care Management [phone] 05/03/2018, 8:19 AM

## 2018-05-03 NOTE — Progress Notes (Signed)
Spoke with Dr Willis regardinAnne Quinn cozaar and hr.  Time changed to give cozaar and continue to watch. Henriette CombsSarah Rowen Wilmer RN

## 2018-05-03 NOTE — Progress Notes (Signed)
*  PRELIMINARY RESULTS* Echocardiogram 2D Echocardiogram has been performed.  [name] 05/03/2018, 3:22 PM

## 2018-05-03 NOTE — Care Management Obs Status (Signed)
MEDICARE OBSERVATION STATUS NOTIFICATION   Patient Details  Name: Donna Quinn MRN: [id number] Date of Birth: 08/22/1935   Medicare Observation Status Notification Given:  Yes. Explained to Ms. Teena Dunk, RN 05/03/2018, 8:04 AM

## 2018-05-03 NOTE — Progress Notes (Signed)
[facility] [place] at [facility]   PATIENT NAME: Donna Quinn    MR#:  [id number]  DATE OF BIRTH:  1936-05-27  SUBJECTIVE:  CHIEF COMPLAINT:   Chief Complaint  Patient presents with  . Weakness   - admitted with weakness - complains of dyspnea today  REVIEW OF SYSTEMS:  Review of Systems  Constitutional: Positive for malaise/fatigue. Negative for chills and fever.  HENT: Negative for congestion, ear discharge, hearing loss and nosebleeds.   Eyes: Negative for blurred vision and double vision.  Respiratory: Positive for shortness of breath. Negative for cough and wheezing.   Cardiovascular: Negative for chest pain and palpitations.  Gastrointestinal: Negative for abdominal pain, constipation, diarrhea, nausea and vomiting.  Genitourinary: Negative for dysuria.  Musculoskeletal: Negative for myalgias.  Neurological: Negative for dizziness, speech change, focal weakness, seizures and headaches.    DRUG ALLERGIES:   Allergies  Allergen Reactions  . Aggrenox [Aspirin-Dipyridamole Er] Other (See Comments)    Reaction: A really bad headache  . Augmentin [Amoxicillin-Pot Clavulanate] Other (See Comments)    Reaction: unknown  . Penicillins Other (See Comments)    Has patient had a PCN reaction causing immediate rash, facial/tongue/throat swelling, SOB or lightheadedness with hypotension: Yes Has patient had a PCN reaction causing severe rash involving mucus membranes or skin necrosis: Yes Has patient had a PCN reaction that required hospitalization No Has patient had a PCN reaction occurring within the last 10 years: No If all of the above answers are "NO", then may proceed with Cephalosporin use.  . Remeron [Mirtazapine] Other (See Comments)    Broke out inside the mouth  . Sulfa Antibiotics Hives and Other (See Comments)    Broke out in welps and had to be hospitalized    VITALS:  Blood pressure 118/67, pulse 81, temperature 98.7 F (37.1 C),  temperature source Oral, resp. rate (!) 22, height 5\' 6"  (1.676 m), weight 57.8 kg, SpO2 96 %.  PHYSICAL EXAMINATION:  Physical Exam   GENERAL:  82 y.o.-year-old patient lying in the bed with no acute distress.  EYES: Pupils equal, round, reactive to light and accommodation. No scleral icterus. Extraocular muscles intact.  HEENT: Head atraumatic, normocephalic. Oropharynx and nasopharynx clear.  NECK:  Supple, no jugular venous distention. No thyroid enlargement, no tenderness.  LUNGS: appears dyspniec, tight on auscultation,   no wheezing, rales,rhonchi or crepitation. No use of accessory muscles of respiration.  CARDIOVASCULAR: S1, S2 normal. No rubs, or gallops. 2/6 systolic murmur present ABDOMEN: Soft, nontender, nondistended. Bowel sounds present. No organomegaly or mass.  EXTREMITIES: No pedal edema, cyanosis, or clubbing.  NEUROLOGIC: Cranial nerves II through XII are intact. Muscle strength 5/5 in all extremities. Sensation intact. Gait not checked.  PSYCHIATRIC: The patient is alert and oriented x 3.  SKIN: No obvious rash, lesion, or ulcer.    LABORATORY PANEL:   CBC Recent Labs  Lab 05/03/18 0315  WBC 5.6  HGB 10.8*  HCT 30.6*  PLT 182   ------------------------------------------------------------------------------------------------------------------  Chemistries  Recent Labs  Lab 05/03/18 0315  NA 128*  K 3.4*  CL 97*  CO2 27  GLUCOSE 100*  BUN 16  CREATININE 1.10*  CALCIUM 8.1*   ------------------------------------------------------------------------------------------------------------------  Cardiac Enzymes Recent Labs  Lab 05/02/18 1920  TROPONINI <0.03   ------------------------------------------------------------------------------------------------------------------  RADIOLOGY:  Dg Chest 1 View  Result Date: 05/02/2018 CLINICAL DATA:  82 year old female with cough. EXAM: CHEST  1 VIEW COMPARISON:  Chest CT dated 03/21/2018 FINDINGS: The  lungs are  clear. There is no pleural effusion or pneumothorax. The cardiac silhouette is within normal limits. Hiatal hernia. No acute osseous pathology. IMPRESSION: No active disease. Electronically Signed   By: Elgie CollardArash  Radparvar M.D.   On: 05/02/2018 22:24   Dg Pelvis 1-2 Views  Result Date: 05/02/2018 CLINICAL DATA:  82 year old female with pelvic pain. EXAM: PELVIS - 1-2 VIEW COMPARISON:  None. FINDINGS: There is a right hip replacement. The arthroplasty component appears intact. There is an apparent 2 mm gap between the superior and medial portion of the acetabular portion of the arthroplasty and the bony acetabulum. There is no acute fracture or dislocation. The bones are osteopenic. Atherosclerotic calcification of the vasculature and an aorto bi iliac endovascular stent graft repair. The soft tissues are grossly unremarkable. IMPRESSION: 1. No acute fracture or dislocation. 2. Intact right hip arthroplasty. Electronically Signed   By: Elgie CollardArash  Radparvar M.D.   On: 05/02/2018 22:27   Ct Head Wo Contrast  Result Date: 05/02/2018 CLINICAL DATA:  Acute confusion.  No reported injury. EXAM: CT HEAD WITHOUT CONTRAST TECHNIQUE: Contiguous axial images were obtained from the base of the skull through the vertex without intravenous contrast. COMPARISON:  10/19/2016 head CT. FINDINGS: Brain: Aneurysm coil is noted in the left suprasellar region. Small focus of encephalomalacia in the left occipital lobe and in the inferior left cerebellar hemisphere. No evidence of parenchymal hemorrhage or extra-axial fluid collection. No mass lesion, mass effect, or midline shift. No CT evidence of acute infarction. Nonspecific moderate subcortical and periventricular white matter hypodensity, most in keeping with chronic small vessel ischemic change. Cerebral volume is age appropriate. No ventriculomegaly. Vascular: No acute abnormality. Skull: No evidence of calvarial fracture. Sinuses/Orbits: The visualized paranasal sinuses  are essentially clear. Other:  The mastoid air cells are unopacified. IMPRESSION: 1.  No evidence of acute intracranial abnormality. 2. Left occipital lobe and inferior left cerebellar hemisphere foci of encephalomalacia. 3. Moderate chronic small vessel ischemic changes in the cerebral white matter. Electronically Signed   By: Delbert PhenixJason A Poff M.D.   On: 05/02/2018 21:53    EKG:   Orders placed or performed during the hospital encounter of 05/02/18  . ED EKG  . ED EKG    ASSESSMENT AND PLAN:   82 y/o F with PMH significant for COPD, collagen vascular disease, HTN, GERD and osteoporosis presented with weakness  1. Hyponatremia- initially thought to be from dehydration, but not improving with IV fluids - monitor for SIADH - hold IV fluids - check ECHO  2. COPD- with mild exacerbation - not on home o2 - steroids and nebs  3. HTN- continue home meds  4. GERD- protonix  5. DVT Prophylaxis- lovenox  PT consulted.     All the records are reviewed and case discussed with Care Management/Social Workerr. Management plans discussed with the patient, family and they are in agreement.  CODE STATUS: full code  TOTAL TIME TAKING CARE OF THIS PATIENT: 38 minutes.   POSSIBLE D/C IN 1-2 DAYS, DEPENDING ON CLINICAL CONDITION.   Enid BaasKALISETTI,Adeeb Konecny M.D on 05/03/2018 at 8:57 PM  Between 7am to 6pm - Pager - [phone]  After 6pm go to [email or web address] - Social research officer, governmentpassword EPAS [email or web address]  [facility] [place] [facility]  Office  [phone]  CC: Primary care physician; Marguarite ArbourSparks, Jeffrey D, MD

## 2018-05-03 NOTE — Plan of Care (Signed)
  Problem: Education: Goal: Knowledge of General Education information will improve Description Including pain rating scale, medication(s)/side effects and non-pharmacologic comfort measures Outcome: Progressing   Problem: Clinical Measurements: Goal: Ability to maintain clinical measurements within normal limits will improve Outcome: Progressing Goal: Will remain free from infection Outcome: Progressing Goal: Respiratory complications will improve Outcome: Progressing   Problem: Coping: Goal: Level of anxiety will decrease Outcome: Progressing   Problem: Elimination: Goal: Will not experience complications related to bowel motility Outcome: Progressing   Problem: Pain Managment: Goal: General experience of comfort will improve Outcome: Progressing

## 2018-05-04 DIAGNOSIS — E871 Hypo-osmolality and hyponatremia: Secondary | ICD-10-CM | POA: Diagnosis not present

## 2018-05-04 DIAGNOSIS — J449 Chronic obstructive pulmonary disease, unspecified: Secondary | ICD-10-CM | POA: Diagnosis not present

## 2018-05-04 DIAGNOSIS — I10 Essential (primary) hypertension: Secondary | ICD-10-CM | POA: Diagnosis not present

## 2018-05-04 DIAGNOSIS — E860 Dehydration: Secondary | ICD-10-CM | POA: Diagnosis not present

## 2018-05-04 LAB — BASIC METABOLIC PANEL
ANION GAP: 10 (ref 5–15)
BUN: 17 mg/dL (ref 8–23)
CALCIUM: 8.8 mg/dL — AB (ref 8.9–10.3)
CO2: 25 mmol/L (ref 22–32)
CREATININE: 1.11 mg/dL — AB (ref 0.44–1.00)
Chloride: 95 mmol/L — ABNORMAL LOW (ref 98–111)
GFR calc non Af Amer: 46 mL/min — ABNORMAL LOW (ref >60)
GFR, EST AFRICAN AMERICAN: 53 mL/min — AB (ref >60)
Glucose, Bld: 98 mg/dL (ref 70–99)
Potassium: 4.3 mmol/L (ref 3.5–5.1)
Sodium: 130 mmol/L — ABNORMAL LOW (ref 135–145)

## 2018-05-04 MED ORDER — BUDESONIDE-FORMOTEROL FUMARATE 160-4.5 MCG/ACT IN AERO: 2.0000 | INHALATION_SPRAY | Freq: Two times a day (BID) | RESPIRATORY_TRACT | 12 refills | Status: DC

## 2018-05-04 MED ORDER — AMLODIPINE BESYLATE 5 MG PO TABS: 5.0000 mg | ORAL_TABLET | Freq: Every day | ORAL | 1 refills | Status: DC

## 2018-05-04 MED ORDER — FLUTICASONE-SALMETEROL 250-50 MCG/DOSE IN AEPB: 1.0000 | INHALATION_SPRAY | Freq: Two times a day (BID) | RESPIRATORY_TRACT | 3 refills | Status: DC

## 2018-05-04 MED ORDER — PREDNISONE 20 MG PO TABS: 40.0000 mg | ORAL_TABLET | Freq: Every day | ORAL | 0 refills | Status: AC

## 2018-05-04 NOTE — Care Management (Signed)
Discharge to home today per Dr. Nemiah Commander. Followed by [facility] for services. Feliberto Gottron, representative for [facility] updated. Family will transport Gwenette Greet RN MSN CCM Care Management [phone]

## 2018-05-04 NOTE — Discharge Summary (Signed)
[facility] - [place] at [facility]   PATIENT NAME: Donna Quinn    MR#:  [id number]  DATE OF BIRTH:  1935-12-11  DATE OF ADMISSION:  05/02/2018   ADMITTING PHYSICIAN: Marguarite Arbour, MD  DATE OF DISCHARGE: 05/04/2018  2:09 PM  PRIMARY CARE PHYSICIAN: Marguarite Arbour, MD   ADMISSION DIAGNOSIS:   Cough [R05] Hyponatremia [E87.1] Failure to thrive in adult [R62.7] Altered mental status, unspecified altered mental status type [R41.82]  DISCHARGE DIAGNOSIS:   Principal Problem:   Dehydration Active Problems:   HTN (hypertension)   HLD (hyperlipidemia)   COPD (chronic obstructive pulmonary disease) (HCC)   GERD (gastroesophageal reflux disease)   Hyponatremia   SECONDARY DIAGNOSIS:   Past Medical History:  Diagnosis Date  . AAA (abdominal aortic aneurysm) (HCC)   . Brain aneurysm   . Collagen vascular disease (HCC)   . COPD (chronic obstructive pulmonary disease) (HCC)   . Dyspnea   . GERD (gastroesophageal reflux disease)   . H/O wheezing   . Hypercholesterolemia   . Hypertension   . Neuropathy   . Osteoporosis   . Stroke [facility]) 2016   twice    HOSPITAL COURSE:   82 y/o F with PMH significant for COPD, collagen vascular disease, HTN, GERD and osteoporosis presented with weakness  1. Hyponatremia-improved with IV fluids.  IV fluids stopped and recommended to monitor fluid intake and cut down salt. -Could also have underlying SIADH. -Improved sodium to 130 -Echo with diastolic dysfunction, EF of 60%.  Not on Lasix at this time.  2. COPD- with mild exacerbation-improved with IV steroids.  Discharged on oral steroids and inhalers.  Has nebulizer at home - not on home o2  3. HTN- continue home meds-patient on Norvasc, losartan and metoprolol  4. GERD- protonix   PT consulted.  Discharged with home health  DISCHARGE CONDITIONS:   Guarded  CONSULTS OBTAINED:   None  DRUG ALLERGIES:   Allergies  Allergen Reactions  .  Aggrenox [Aspirin-Dipyridamole Er] Other (See Comments)    Reaction: A really bad headache  . Augmentin [Amoxicillin-Pot Clavulanate] Other (See Comments)    Reaction: unknown  . Penicillins Other (See Comments)    Has patient had a PCN reaction causing immediate rash, facial/tongue/throat swelling, SOB or lightheadedness with hypotension: Yes Has patient had a PCN reaction causing severe rash involving mucus membranes or skin necrosis: Yes Has patient had a PCN reaction that required hospitalization No Has patient had a PCN reaction occurring within the last 10 years: No If all of the above answers are "NO", then may proceed with Cephalosporin use.  . Remeron [Mirtazapine] Other (See Comments)    Broke out inside the mouth  . Sulfa Antibiotics Hives and Other (See Comments)    Broke out in welps and had to be hospitalized   DISCHARGE MEDICATIONS:   Allergies as of 05/04/2018      Reactions   Aggrenox [aspirin-dipyridamole Er] Other (See Comments)   Reaction: A really bad headache   Augmentin [amoxicillin-pot Clavulanate] Other (See Comments)   Reaction: unknown   Penicillins Other (See Comments)   Has patient had a PCN reaction causing immediate rash, facial/tongue/throat swelling, SOB or lightheadedness with hypotension: Yes Has patient had a PCN reaction causing severe rash involving mucus membranes or skin necrosis: Yes Has patient had a PCN reaction that required hospitalization No Has patient had a PCN reaction occurring within the last 10 years: No If all of the above answers are "NO", then  may proceed with Cephalosporin use.   Remeron [mirtazapine] Other (See Comments)   Broke out inside the mouth   Sulfa Antibiotics Hives, Other (See Comments)   Broke out in welps and had to be hospitalized      Medication List    TAKE these medications   acetaminophen 500 MG tablet Commonly known as:  TYLENOL Take 1 tablet (500 mg total) by mouth every 6 (six) hours as needed for  moderate pain or headache.   amLODipine 5 MG tablet Commonly known as:  NORVASC Take 1 tablet (5 mg total) by mouth daily. Start taking on:  05/05/2018   atorvastatin 40 MG tablet Commonly known as:  LIPITOR Take 40 mg by mouth every evening.   clopidogrel 75 MG tablet Commonly known as:  PLAVIX Take 75 mg by mouth every morning.   EASY-LAX PLUS 8.6-50 MG tablet Generic drug:  senna-docusate Take 1 tablet by mouth daily as needed for mild constipation.   Fluticasone-Salmeterol 250-50 MCG/DOSE Aepb Commonly known as:  ADVAIR Inhale 1 puff into the lungs 2 (two) times daily.   gabapentin 300 MG capsule Commonly known as:  NEURONTIN Take 300 mg by mouth 2 (two) times daily.   hydrALAZINE 25 MG tablet Commonly known as:  APRESOLINE Take 50 mg by mouth 3 (three) times daily.   ipratropium-albuterol 0.5-2.5 (3) MG/3ML Soln Commonly known as:  DUONEB Inhale 3 mLs into the lungs 4 (four) times daily.   LORazepam 1 MG tablet Commonly known as:  ATIVAN Take 1 mg by mouth at bedtime as needed for anxiety or sleep.   losartan 50 MG tablet Commonly known as:  COZAAR Take 50 mg by mouth 2 (two) times daily.   metoprolol succinate 25 MG 24 hr tablet Commonly known as:  TOPROL-XL Take 25 mg by mouth daily.   omeprazole 20 MG capsule Commonly known as:  PRILOSEC Take 20 mg by mouth 2 (two) times daily before a meal.   predniSONE 20 MG tablet Commonly known as:  DELTASONE Take 2 tablets (40 mg total) by mouth daily for 5 days.   vitamin B-12 1000 MCG tablet Commonly known as:  CYANOCOBALAMIN Take 1,000 mcg by mouth daily.   Vitamin D (Ergocalciferol) 50000 units Caps capsule Commonly known as:  DRISDOL Take 1 capsule by mouth once a week. Takes on Thursday        DISCHARGE INSTRUCTIONS:   1. PCP f/u in 1-2 weeks  DIET:   Cardiac diet  ACTIVITY:   Activity as tolerated  OXYGEN:   Home Oxygen: No.  Oxygen Delivery: room air  DISCHARGE LOCATION:   home     If you experience worsening of your admission symptoms, develop shortness of breath, life threatening emergency, suicidal or homicidal thoughts you must seek medical attention immediately by calling 911 or calling your MD immediately  if symptoms less severe.  You Must read complete instructions/literature along with all the possible adverse reactions/side effects for all the Medicines you take and that have been prescribed to you. Take any new Medicines after you have completely understood and accpet all the possible adverse reactions/side effects.   Please note  You were cared for by a hospitalist during your hospital stay. If you have any questions about your discharge medications or the care you received while you were in the hospital after you are discharged, you can call the unit and asked to speak with the hospitalist on call if the hospitalist that took care of you is not available.  Once you are discharged, your primary care physician will handle any further medical issues. Please note that NO REFILLS for any discharge medications will be authorized once you are discharged, as it is imperative that you return to your primary care physician (or establish a relationship with a primary care physician if you do not have one) for your aftercare needs so that they can reassess your need for medications and monitor your lab values.    On the day of Discharge:  VITAL SIGNS:   Blood pressure (!) 149/64, pulse 75, temperature 97.9 F (36.6 C), resp. rate 17, height 5\' 6"  (1.676 m), weight 57.8 kg, SpO2 96 %.  PHYSICAL EXAMINATION:    GENERAL:  82 y.o.-year-old patient lying in the bed with no acute distress.  EYES: Pupils equal, round, reactive to light and accommodation. No scleral icterus. Extraocular muscles intact.  HEENT: Head atraumatic, normocephalic. Oropharynx and nasopharynx clear.  NECK:  Supple, no jugular venous distention. No thyroid enlargement, no tenderness.  LUNGS:   improved air entry, minimal scattered wheezing, no rales,rhonchi or crepitation. No use of accessory muscles of respiration.  CARDIOVASCULAR: S1, S2 normal. No rubs, or gallops. 2/6 systolic murmur present ABDOMEN: Soft, nontender, nondistended. Bowel sounds present. No organomegaly or mass.  EXTREMITIES: No pedal edema, cyanosis, or clubbing.  NEUROLOGIC: Cranial nerves II through XII are intact. Muscle strength 5/5 in all extremities. Sensation intact. Gait not checked.  PSYCHIATRIC: The patient is alert and oriented x 3.  SKIN: No obvious rash, lesion, or ulcer.   DATA REVIEW:   CBC Recent Labs  Lab 05/03/18 0315  WBC 5.6  HGB 10.8*  HCT 30.6*  PLT 182    Chemistries  Recent Labs  Lab 05/04/18 0813  NA 130*  K 4.3  CL 95*  CO2 25  GLUCOSE 98  BUN 17  CREATININE 1.11*  CALCIUM 8.8*     Microbiology Results  Results for orders placed or performed during the hospital encounter of 10/19/16  Urine culture     Status: None   Collection Time: 10/19/16 10:22 PM  Result Value Ref Range Status   Specimen Description URINE, RANDOM  Final   Special Requests NONE  Final   Culture   Final    NO GROWTH Performed at [facility], [address], [place], [place] [address]    Report Status 10/21/2016 FINAL  Final  Surgical PCR screen     Status: None   Collection Time: 10/19/16 10:46 PM  Result Value Ref Range Status   MRSA, PCR NEGATIVE NEGATIVE Final   Staphylococcus aureus NEGATIVE NEGATIVE Final    Comment:        The Xpert SA Assay (FDA approved for NASAL specimens in patients over 52 years of age), is one component of a comprehensive surveillance program.  Test performance has been validated by [facility] for patients greater than or equal to 70 year old. It is not intended to diagnose infection nor to guide or monitor treatment.     RADIOLOGY:  No results found.   Management plans discussed with the patient, family and they are in  agreement.  CODE STATUS:     Code Status Orders  (From admission, onward)         Start     Ordered   05/03/18 0124  Full code  Continuous     05/03/18 0123        Code Status History    Date Active Date Inactive Code Status  Order ID Comments User Context   03/21/2018 1958 03/23/2018 1509 Full Code [id number]  Houston Siren, MD Inpatient   10/19/2016 2151 10/22/2016 1835 Full Code [id number]  Oralia Manis, MD ED   10/19/2016 2047 10/19/2016 2047 Full Code [id number]  Juanell Fairly, MD ED   08/06/2016 1744 08/08/2016 1637 Full Code [id number]  Milagros Loll, MD ED      TOTAL TIME TAKING CARE OF THIS PATIENT: 38 minutes.    Enid Baas M.D on 05/04/2018 at 3:19 PM  Between 7am to 6pm - Pager - [phone]  After 6pm go to [email or web address] - Social research officer, government  [facility] [place] [facility]  Office  [phone]  CC: Primary care physician; Marguarite Arbour, MD   Note: This dictation was prepared with Dragon dictation along with smaller phrase technology. Any transcriptional errors that result from this process are unintentional.

## 2018-05-04 NOTE — Progress Notes (Signed)
Discharge instructions given and went over with patient at bedside. All questions answered. Patient to discharge home. Awaiting transportation, to be provided by family. Bo Mcclintock, RN

## 2018-05-05 DIAGNOSIS — K219 Gastro-esophageal reflux disease without esophagitis: Secondary | ICD-10-CM | POA: Diagnosis not present

## 2018-05-05 DIAGNOSIS — Z7951 Long term (current) use of inhaled steroids: Secondary | ICD-10-CM | POA: Diagnosis not present

## 2018-05-05 DIAGNOSIS — J9620 Acute and chronic respiratory failure, unspecified whether with hypoxia or hypercapnia: Secondary | ICD-10-CM | POA: Diagnosis not present

## 2018-05-05 DIAGNOSIS — I10 Essential (primary) hypertension: Secondary | ICD-10-CM | POA: Diagnosis not present

## 2018-05-05 DIAGNOSIS — I69354 Hemiplegia and hemiparesis following cerebral infarction affecting left non-dominant side: Secondary | ICD-10-CM | POA: Diagnosis not present

## 2018-05-05 DIAGNOSIS — R918 Other nonspecific abnormal finding of lung field: Secondary | ICD-10-CM | POA: Diagnosis not present

## 2018-05-05 DIAGNOSIS — J441 Chronic obstructive pulmonary disease with (acute) exacerbation: Secondary | ICD-10-CM | POA: Diagnosis not present

## 2018-05-05 DIAGNOSIS — F419 Anxiety disorder, unspecified: Secondary | ICD-10-CM | POA: Diagnosis not present

## 2018-05-05 DIAGNOSIS — E785 Hyperlipidemia, unspecified: Secondary | ICD-10-CM | POA: Diagnosis not present

## 2018-05-08 DIAGNOSIS — J441 Chronic obstructive pulmonary disease with (acute) exacerbation: Secondary | ICD-10-CM | POA: Diagnosis not present

## 2018-05-08 DIAGNOSIS — R918 Other nonspecific abnormal finding of lung field: Secondary | ICD-10-CM | POA: Diagnosis not present

## 2018-05-08 DIAGNOSIS — K219 Gastro-esophageal reflux disease without esophagitis: Secondary | ICD-10-CM | POA: Diagnosis not present

## 2018-05-08 DIAGNOSIS — I10 Essential (primary) hypertension: Secondary | ICD-10-CM | POA: Diagnosis not present

## 2018-05-08 DIAGNOSIS — I69354 Hemiplegia and hemiparesis following cerebral infarction affecting left non-dominant side: Secondary | ICD-10-CM | POA: Diagnosis not present

## 2018-05-08 DIAGNOSIS — F419 Anxiety disorder, unspecified: Secondary | ICD-10-CM | POA: Diagnosis not present

## 2018-05-08 DIAGNOSIS — Z7951 Long term (current) use of inhaled steroids: Secondary | ICD-10-CM | POA: Diagnosis not present

## 2018-05-08 DIAGNOSIS — J9620 Acute and chronic respiratory failure, unspecified whether with hypoxia or hypercapnia: Secondary | ICD-10-CM | POA: Diagnosis not present

## 2018-05-08 DIAGNOSIS — E785 Hyperlipidemia, unspecified: Secondary | ICD-10-CM | POA: Diagnosis not present

## 2018-05-09 ENCOUNTER — Ambulatory Visit: Payer: Medicare HMO

## 2018-05-09 DIAGNOSIS — F419 Anxiety disorder, unspecified: Secondary | ICD-10-CM | POA: Diagnosis not present

## 2018-05-09 DIAGNOSIS — R918 Other nonspecific abnormal finding of lung field: Secondary | ICD-10-CM | POA: Diagnosis not present

## 2018-05-09 DIAGNOSIS — E785 Hyperlipidemia, unspecified: Secondary | ICD-10-CM | POA: Diagnosis not present

## 2018-05-09 DIAGNOSIS — J441 Chronic obstructive pulmonary disease with (acute) exacerbation: Secondary | ICD-10-CM | POA: Diagnosis not present

## 2018-05-09 DIAGNOSIS — K219 Gastro-esophageal reflux disease without esophagitis: Secondary | ICD-10-CM | POA: Diagnosis not present

## 2018-05-09 DIAGNOSIS — I10 Essential (primary) hypertension: Secondary | ICD-10-CM | POA: Diagnosis not present

## 2018-05-09 DIAGNOSIS — Z7951 Long term (current) use of inhaled steroids: Secondary | ICD-10-CM | POA: Diagnosis not present

## 2018-05-09 DIAGNOSIS — J9620 Acute and chronic respiratory failure, unspecified whether with hypoxia or hypercapnia: Secondary | ICD-10-CM | POA: Diagnosis not present

## 2018-05-09 DIAGNOSIS — I69354 Hemiplegia and hemiparesis following cerebral infarction affecting left non-dominant side: Secondary | ICD-10-CM | POA: Diagnosis not present

## 2018-05-10 DIAGNOSIS — J431 Panlobular emphysema: Secondary | ICD-10-CM | POA: Diagnosis not present

## 2018-05-10 DIAGNOSIS — I10 Essential (primary) hypertension: Secondary | ICD-10-CM | POA: Diagnosis not present

## 2018-05-10 DIAGNOSIS — Z79899 Other long term (current) drug therapy: Secondary | ICD-10-CM | POA: Diagnosis not present

## 2018-05-10 DIAGNOSIS — F325 Major depressive disorder, single episode, in full remission: Secondary | ICD-10-CM | POA: Diagnosis not present

## 2018-05-15 DIAGNOSIS — I714 Abdominal aortic aneurysm, without rupture: Secondary | ICD-10-CM | POA: Diagnosis not present

## 2018-05-15 DIAGNOSIS — E785 Hyperlipidemia, unspecified: Secondary | ICD-10-CM | POA: Diagnosis not present

## 2018-05-15 DIAGNOSIS — Z8673 Personal history of transient ischemic attack (TIA), and cerebral infarction without residual deficits: Secondary | ICD-10-CM | POA: Diagnosis not present

## 2018-05-15 DIAGNOSIS — M79671 Pain in right foot: Secondary | ICD-10-CM | POA: Diagnosis not present

## 2018-05-15 DIAGNOSIS — I6522 Occlusion and stenosis of left carotid artery: Secondary | ICD-10-CM | POA: Diagnosis not present

## 2018-05-15 DIAGNOSIS — J449 Chronic obstructive pulmonary disease, unspecified: Secondary | ICD-10-CM | POA: Diagnosis not present

## 2018-05-15 DIAGNOSIS — I10 Essential (primary) hypertension: Secondary | ICD-10-CM | POA: Diagnosis not present

## 2018-05-15 DIAGNOSIS — I739 Peripheral vascular disease, unspecified: Secondary | ICD-10-CM | POA: Diagnosis not present

## 2018-05-15 DIAGNOSIS — M79672 Pain in left foot: Secondary | ICD-10-CM | POA: Diagnosis not present

## 2018-05-16 ENCOUNTER — Ambulatory Visit: Payer: Medicare HMO | Admitting: Pulmonary Disease

## 2018-05-16 DIAGNOSIS — J9620 Acute and chronic respiratory failure, unspecified whether with hypoxia or hypercapnia: Secondary | ICD-10-CM | POA: Diagnosis not present

## 2018-05-16 DIAGNOSIS — Z7951 Long term (current) use of inhaled steroids: Secondary | ICD-10-CM | POA: Diagnosis not present

## 2018-05-16 DIAGNOSIS — J441 Chronic obstructive pulmonary disease with (acute) exacerbation: Secondary | ICD-10-CM | POA: Diagnosis not present

## 2018-05-16 DIAGNOSIS — I10 Essential (primary) hypertension: Secondary | ICD-10-CM | POA: Diagnosis not present

## 2018-05-16 DIAGNOSIS — R918 Other nonspecific abnormal finding of lung field: Secondary | ICD-10-CM | POA: Diagnosis not present

## 2018-05-16 DIAGNOSIS — K219 Gastro-esophageal reflux disease without esophagitis: Secondary | ICD-10-CM | POA: Diagnosis not present

## 2018-05-16 DIAGNOSIS — E785 Hyperlipidemia, unspecified: Secondary | ICD-10-CM | POA: Diagnosis not present

## 2018-05-16 DIAGNOSIS — F419 Anxiety disorder, unspecified: Secondary | ICD-10-CM | POA: Diagnosis not present

## 2018-05-16 DIAGNOSIS — I69354 Hemiplegia and hemiparesis following cerebral infarction affecting left non-dominant side: Secondary | ICD-10-CM | POA: Diagnosis not present

## 2018-05-19 DIAGNOSIS — E785 Hyperlipidemia, unspecified: Secondary | ICD-10-CM | POA: Diagnosis not present

## 2018-05-19 DIAGNOSIS — J9620 Acute and chronic respiratory failure, unspecified whether with hypoxia or hypercapnia: Secondary | ICD-10-CM | POA: Diagnosis not present

## 2018-05-19 DIAGNOSIS — I69354 Hemiplegia and hemiparesis following cerebral infarction affecting left non-dominant side: Secondary | ICD-10-CM | POA: Diagnosis not present

## 2018-05-19 DIAGNOSIS — K219 Gastro-esophageal reflux disease without esophagitis: Secondary | ICD-10-CM | POA: Diagnosis not present

## 2018-05-19 DIAGNOSIS — Z7951 Long term (current) use of inhaled steroids: Secondary | ICD-10-CM | POA: Diagnosis not present

## 2018-05-19 DIAGNOSIS — F419 Anxiety disorder, unspecified: Secondary | ICD-10-CM | POA: Diagnosis not present

## 2018-05-19 DIAGNOSIS — R918 Other nonspecific abnormal finding of lung field: Secondary | ICD-10-CM | POA: Diagnosis not present

## 2018-05-19 DIAGNOSIS — I10 Essential (primary) hypertension: Secondary | ICD-10-CM | POA: Diagnosis not present

## 2018-05-19 DIAGNOSIS — J441 Chronic obstructive pulmonary disease with (acute) exacerbation: Secondary | ICD-10-CM | POA: Diagnosis not present

## 2018-05-22 DIAGNOSIS — I10 Essential (primary) hypertension: Secondary | ICD-10-CM | POA: Diagnosis not present

## 2018-05-22 DIAGNOSIS — E785 Hyperlipidemia, unspecified: Secondary | ICD-10-CM | POA: Diagnosis not present

## 2018-05-22 DIAGNOSIS — I69354 Hemiplegia and hemiparesis following cerebral infarction affecting left non-dominant side: Secondary | ICD-10-CM | POA: Diagnosis not present

## 2018-05-22 DIAGNOSIS — R918 Other nonspecific abnormal finding of lung field: Secondary | ICD-10-CM | POA: Diagnosis not present

## 2018-05-22 DIAGNOSIS — K219 Gastro-esophageal reflux disease without esophagitis: Secondary | ICD-10-CM | POA: Diagnosis not present

## 2018-05-22 DIAGNOSIS — J9620 Acute and chronic respiratory failure, unspecified whether with hypoxia or hypercapnia: Secondary | ICD-10-CM | POA: Diagnosis not present

## 2018-05-22 DIAGNOSIS — F419 Anxiety disorder, unspecified: Secondary | ICD-10-CM | POA: Diagnosis not present

## 2018-05-22 DIAGNOSIS — Z7951 Long term (current) use of inhaled steroids: Secondary | ICD-10-CM | POA: Diagnosis not present

## 2018-05-22 DIAGNOSIS — J441 Chronic obstructive pulmonary disease with (acute) exacerbation: Secondary | ICD-10-CM | POA: Diagnosis not present

## 2018-05-29 DIAGNOSIS — K219 Gastro-esophageal reflux disease without esophagitis: Secondary | ICD-10-CM | POA: Diagnosis not present

## 2018-05-29 DIAGNOSIS — I69354 Hemiplegia and hemiparesis following cerebral infarction affecting left non-dominant side: Secondary | ICD-10-CM | POA: Diagnosis not present

## 2018-05-29 DIAGNOSIS — J441 Chronic obstructive pulmonary disease with (acute) exacerbation: Secondary | ICD-10-CM | POA: Diagnosis not present

## 2018-05-29 DIAGNOSIS — J9620 Acute and chronic respiratory failure, unspecified whether with hypoxia or hypercapnia: Secondary | ICD-10-CM | POA: Diagnosis not present

## 2018-05-29 DIAGNOSIS — Z7951 Long term (current) use of inhaled steroids: Secondary | ICD-10-CM | POA: Diagnosis not present

## 2018-05-29 DIAGNOSIS — E785 Hyperlipidemia, unspecified: Secondary | ICD-10-CM | POA: Diagnosis not present

## 2018-05-29 DIAGNOSIS — I10 Essential (primary) hypertension: Secondary | ICD-10-CM | POA: Diagnosis not present

## 2018-05-29 DIAGNOSIS — R918 Other nonspecific abnormal finding of lung field: Secondary | ICD-10-CM | POA: Diagnosis not present

## 2018-05-29 DIAGNOSIS — F419 Anxiety disorder, unspecified: Secondary | ICD-10-CM | POA: Diagnosis not present

## 2018-06-01 DIAGNOSIS — M79675 Pain in left toe(s): Secondary | ICD-10-CM | POA: Diagnosis not present

## 2018-06-01 DIAGNOSIS — M79674 Pain in right toe(s): Secondary | ICD-10-CM | POA: Diagnosis not present

## 2018-06-01 DIAGNOSIS — B351 Tinea unguium: Secondary | ICD-10-CM | POA: Diagnosis not present

## 2018-06-05 DIAGNOSIS — I10 Essential (primary) hypertension: Secondary | ICD-10-CM | POA: Diagnosis not present

## 2018-06-05 DIAGNOSIS — J431 Panlobular emphysema: Secondary | ICD-10-CM | POA: Diagnosis not present

## 2018-06-05 DIAGNOSIS — Z79899 Other long term (current) drug therapy: Secondary | ICD-10-CM | POA: Diagnosis not present

## 2018-06-06 DIAGNOSIS — R918 Other nonspecific abnormal finding of lung field: Secondary | ICD-10-CM | POA: Diagnosis not present

## 2018-06-06 DIAGNOSIS — J441 Chronic obstructive pulmonary disease with (acute) exacerbation: Secondary | ICD-10-CM | POA: Diagnosis not present

## 2018-06-06 DIAGNOSIS — F419 Anxiety disorder, unspecified: Secondary | ICD-10-CM | POA: Diagnosis not present

## 2018-06-06 DIAGNOSIS — Z7951 Long term (current) use of inhaled steroids: Secondary | ICD-10-CM | POA: Diagnosis not present

## 2018-06-06 DIAGNOSIS — I69354 Hemiplegia and hemiparesis following cerebral infarction affecting left non-dominant side: Secondary | ICD-10-CM | POA: Diagnosis not present

## 2018-06-06 DIAGNOSIS — I10 Essential (primary) hypertension: Secondary | ICD-10-CM | POA: Diagnosis not present

## 2018-06-06 DIAGNOSIS — E785 Hyperlipidemia, unspecified: Secondary | ICD-10-CM | POA: Diagnosis not present

## 2018-06-06 DIAGNOSIS — K219 Gastro-esophageal reflux disease without esophagitis: Secondary | ICD-10-CM | POA: Diagnosis not present

## 2018-06-06 DIAGNOSIS — J9620 Acute and chronic respiratory failure, unspecified whether with hypoxia or hypercapnia: Secondary | ICD-10-CM | POA: Diagnosis not present

## 2018-06-13 DIAGNOSIS — E785 Hyperlipidemia, unspecified: Secondary | ICD-10-CM | POA: Diagnosis not present

## 2018-06-13 DIAGNOSIS — I10 Essential (primary) hypertension: Secondary | ICD-10-CM | POA: Diagnosis not present

## 2018-06-13 DIAGNOSIS — F419 Anxiety disorder, unspecified: Secondary | ICD-10-CM | POA: Diagnosis not present

## 2018-06-13 DIAGNOSIS — I69354 Hemiplegia and hemiparesis following cerebral infarction affecting left non-dominant side: Secondary | ICD-10-CM | POA: Diagnosis not present

## 2018-06-13 DIAGNOSIS — K219 Gastro-esophageal reflux disease without esophagitis: Secondary | ICD-10-CM | POA: Diagnosis not present

## 2018-06-13 DIAGNOSIS — Z7951 Long term (current) use of inhaled steroids: Secondary | ICD-10-CM | POA: Diagnosis not present

## 2018-06-13 DIAGNOSIS — J9620 Acute and chronic respiratory failure, unspecified whether with hypoxia or hypercapnia: Secondary | ICD-10-CM | POA: Diagnosis not present

## 2018-06-13 DIAGNOSIS — R918 Other nonspecific abnormal finding of lung field: Secondary | ICD-10-CM | POA: Diagnosis not present

## 2018-06-13 DIAGNOSIS — J441 Chronic obstructive pulmonary disease with (acute) exacerbation: Secondary | ICD-10-CM | POA: Diagnosis not present

## 2018-06-14 DIAGNOSIS — R918 Other nonspecific abnormal finding of lung field: Secondary | ICD-10-CM | POA: Diagnosis not present

## 2018-06-14 DIAGNOSIS — Z7951 Long term (current) use of inhaled steroids: Secondary | ICD-10-CM | POA: Diagnosis not present

## 2018-06-14 DIAGNOSIS — I69354 Hemiplegia and hemiparesis following cerebral infarction affecting left non-dominant side: Secondary | ICD-10-CM | POA: Diagnosis not present

## 2018-06-14 DIAGNOSIS — J441 Chronic obstructive pulmonary disease with (acute) exacerbation: Secondary | ICD-10-CM | POA: Diagnosis not present

## 2018-06-14 DIAGNOSIS — F419 Anxiety disorder, unspecified: Secondary | ICD-10-CM | POA: Diagnosis not present

## 2018-06-14 DIAGNOSIS — I10 Essential (primary) hypertension: Secondary | ICD-10-CM | POA: Diagnosis not present

## 2018-06-14 DIAGNOSIS — J9620 Acute and chronic respiratory failure, unspecified whether with hypoxia or hypercapnia: Secondary | ICD-10-CM | POA: Diagnosis not present

## 2018-06-14 DIAGNOSIS — K219 Gastro-esophageal reflux disease without esophagitis: Secondary | ICD-10-CM | POA: Diagnosis not present

## 2018-06-19 DIAGNOSIS — I10 Essential (primary) hypertension: Secondary | ICD-10-CM | POA: Diagnosis not present

## 2018-06-19 DIAGNOSIS — J9620 Acute and chronic respiratory failure, unspecified whether with hypoxia or hypercapnia: Secondary | ICD-10-CM | POA: Diagnosis not present

## 2018-06-19 DIAGNOSIS — E785 Hyperlipidemia, unspecified: Secondary | ICD-10-CM | POA: Diagnosis not present

## 2018-06-19 DIAGNOSIS — I69354 Hemiplegia and hemiparesis following cerebral infarction affecting left non-dominant side: Secondary | ICD-10-CM | POA: Diagnosis not present

## 2018-06-19 DIAGNOSIS — R918 Other nonspecific abnormal finding of lung field: Secondary | ICD-10-CM | POA: Diagnosis not present

## 2018-06-19 DIAGNOSIS — F419 Anxiety disorder, unspecified: Secondary | ICD-10-CM | POA: Diagnosis not present

## 2018-06-19 DIAGNOSIS — K219 Gastro-esophageal reflux disease without esophagitis: Secondary | ICD-10-CM | POA: Diagnosis not present

## 2018-06-19 DIAGNOSIS — Z7951 Long term (current) use of inhaled steroids: Secondary | ICD-10-CM | POA: Diagnosis not present

## 2018-06-19 DIAGNOSIS — J441 Chronic obstructive pulmonary disease with (acute) exacerbation: Secondary | ICD-10-CM | POA: Diagnosis not present

## 2018-07-03 DIAGNOSIS — Z7951 Long term (current) use of inhaled steroids: Secondary | ICD-10-CM | POA: Diagnosis not present

## 2018-07-03 DIAGNOSIS — F419 Anxiety disorder, unspecified: Secondary | ICD-10-CM | POA: Diagnosis not present

## 2018-07-03 DIAGNOSIS — K219 Gastro-esophageal reflux disease without esophagitis: Secondary | ICD-10-CM | POA: Diagnosis not present

## 2018-07-03 DIAGNOSIS — I10 Essential (primary) hypertension: Secondary | ICD-10-CM | POA: Diagnosis not present

## 2018-07-03 DIAGNOSIS — E785 Hyperlipidemia, unspecified: Secondary | ICD-10-CM | POA: Diagnosis not present

## 2018-07-03 DIAGNOSIS — J441 Chronic obstructive pulmonary disease with (acute) exacerbation: Secondary | ICD-10-CM | POA: Diagnosis not present

## 2018-07-03 DIAGNOSIS — J9620 Acute and chronic respiratory failure, unspecified whether with hypoxia or hypercapnia: Secondary | ICD-10-CM | POA: Diagnosis not present

## 2018-07-03 DIAGNOSIS — I69354 Hemiplegia and hemiparesis following cerebral infarction affecting left non-dominant side: Secondary | ICD-10-CM | POA: Diagnosis not present

## 2018-07-03 DIAGNOSIS — R918 Other nonspecific abnormal finding of lung field: Secondary | ICD-10-CM | POA: Diagnosis not present

## 2018-07-17 DIAGNOSIS — E785 Hyperlipidemia, unspecified: Secondary | ICD-10-CM | POA: Diagnosis not present

## 2018-07-17 DIAGNOSIS — J441 Chronic obstructive pulmonary disease with (acute) exacerbation: Secondary | ICD-10-CM | POA: Diagnosis not present

## 2018-07-17 DIAGNOSIS — F419 Anxiety disorder, unspecified: Secondary | ICD-10-CM | POA: Diagnosis not present

## 2018-07-17 DIAGNOSIS — I10 Essential (primary) hypertension: Secondary | ICD-10-CM | POA: Diagnosis not present

## 2018-07-17 DIAGNOSIS — Z79899 Other long term (current) drug therapy: Secondary | ICD-10-CM | POA: Diagnosis not present

## 2018-07-17 DIAGNOSIS — Z7951 Long term (current) use of inhaled steroids: Secondary | ICD-10-CM | POA: Diagnosis not present

## 2018-07-17 DIAGNOSIS — K219 Gastro-esophageal reflux disease without esophagitis: Secondary | ICD-10-CM | POA: Diagnosis not present

## 2018-07-17 DIAGNOSIS — J9620 Acute and chronic respiratory failure, unspecified whether with hypoxia or hypercapnia: Secondary | ICD-10-CM | POA: Diagnosis not present

## 2018-07-17 DIAGNOSIS — I69354 Hemiplegia and hemiparesis following cerebral infarction affecting left non-dominant side: Secondary | ICD-10-CM | POA: Diagnosis not present

## 2018-07-17 DIAGNOSIS — R918 Other nonspecific abnormal finding of lung field: Secondary | ICD-10-CM | POA: Diagnosis not present

## 2018-07-17 DIAGNOSIS — J431 Panlobular emphysema: Secondary | ICD-10-CM | POA: Diagnosis not present

## 2018-07-17 DIAGNOSIS — Z23 Encounter for immunization: Secondary | ICD-10-CM | POA: Diagnosis not present

## 2018-08-05 ENCOUNTER — Encounter: Payer: Self-pay | Admitting: Emergency Medicine

## 2018-08-05 ENCOUNTER — Other Ambulatory Visit: Payer: Self-pay

## 2018-08-05 ENCOUNTER — Inpatient Hospital Stay
Admission: EM | Admit: 2018-08-05 | Discharge: 2018-08-07 | DRG: 192 | Disposition: A | Discharge status: Home Health | Payer: Medicare HMO | Attending: Specialist | Admitting: Specialist

## 2018-08-05 ENCOUNTER — Emergency Department: Payer: Medicare HMO

## 2018-08-05 DIAGNOSIS — Z9841 Cataract extraction status, right eye: Secondary | ICD-10-CM

## 2018-08-05 DIAGNOSIS — I10 Essential (primary) hypertension: Secondary | ICD-10-CM | POA: Diagnosis present

## 2018-08-05 DIAGNOSIS — G629 Polyneuropathy, unspecified: Secondary | ICD-10-CM | POA: Diagnosis present

## 2018-08-05 DIAGNOSIS — Z7902 Long term (current) use of antithrombotics/antiplatelets: Secondary | ICD-10-CM | POA: Diagnosis not present

## 2018-08-05 DIAGNOSIS — Z87891 Personal history of nicotine dependence: Secondary | ICD-10-CM

## 2018-08-05 DIAGNOSIS — E785 Hyperlipidemia, unspecified: Secondary | ICD-10-CM | POA: Diagnosis not present

## 2018-08-05 DIAGNOSIS — M810 Age-related osteoporosis without current pathological fracture: Secondary | ICD-10-CM | POA: Diagnosis not present

## 2018-08-05 DIAGNOSIS — J209 Acute bronchitis, unspecified: Secondary | ICD-10-CM | POA: Diagnosis not present

## 2018-08-05 DIAGNOSIS — Z803 Family history of malignant neoplasm of breast: Secondary | ICD-10-CM

## 2018-08-05 DIAGNOSIS — Z841 Family history of disorders of kidney and ureter: Secondary | ICD-10-CM

## 2018-08-05 DIAGNOSIS — J441 Chronic obstructive pulmonary disease with (acute) exacerbation: Principal | ICD-10-CM | POA: Diagnosis present

## 2018-08-05 DIAGNOSIS — J440 Chronic obstructive pulmonary disease with acute lower respiratory infection: Secondary | ICD-10-CM | POA: Diagnosis present

## 2018-08-05 DIAGNOSIS — K219 Gastro-esophageal reflux disease without esophagitis: Secondary | ICD-10-CM | POA: Diagnosis present

## 2018-08-05 DIAGNOSIS — R531 Weakness: Secondary | ICD-10-CM | POA: Diagnosis not present

## 2018-08-05 DIAGNOSIS — Z961 Presence of intraocular lens: Secondary | ICD-10-CM | POA: Diagnosis present

## 2018-08-05 DIAGNOSIS — Z96641 Presence of right artificial hip joint: Secondary | ICD-10-CM | POA: Diagnosis present

## 2018-08-05 DIAGNOSIS — E876 Hypokalemia: Secondary | ICD-10-CM | POA: Diagnosis not present

## 2018-08-05 DIAGNOSIS — K449 Diaphragmatic hernia without obstruction or gangrene: Secondary | ICD-10-CM | POA: Diagnosis not present

## 2018-08-05 DIAGNOSIS — E7800 Pure hypercholesterolemia, unspecified: Secondary | ICD-10-CM | POA: Diagnosis not present

## 2018-08-05 DIAGNOSIS — Z79899 Other long term (current) drug therapy: Secondary | ICD-10-CM

## 2018-08-05 DIAGNOSIS — Z9842 Cataract extraction status, left eye: Secondary | ICD-10-CM | POA: Diagnosis not present

## 2018-08-05 DIAGNOSIS — Z888 Allergy status to other drugs, medicaments and biological substances status: Secondary | ICD-10-CM | POA: Diagnosis not present

## 2018-08-05 DIAGNOSIS — Z880 Allergy status to penicillin: Secondary | ICD-10-CM | POA: Diagnosis not present

## 2018-08-05 DIAGNOSIS — Z8673 Personal history of transient ischemic attack (TIA), and cerebral infarction without residual deficits: Secondary | ICD-10-CM

## 2018-08-05 DIAGNOSIS — Z882 Allergy status to sulfonamides status: Secondary | ICD-10-CM | POA: Diagnosis not present

## 2018-08-05 DIAGNOSIS — R0602 Shortness of breath: Secondary | ICD-10-CM | POA: Diagnosis not present

## 2018-08-05 LAB — BASIC METABOLIC PANEL
Anion gap: 11 (ref 5–15)
BUN: 37 mg/dL — ABNORMAL HIGH (ref 8–23)
CO2: 27 mmol/L (ref 22–32)
Calcium: 8.9 mg/dL (ref 8.9–10.3)
Chloride: 96 mmol/L — ABNORMAL LOW (ref 98–111)
Creatinine, Ser: 1.29 mg/dL — ABNORMAL HIGH (ref 0.44–1.00)
GFR calc Af Amer: 45 mL/min — ABNORMAL LOW (ref >60)
GFR calc non Af Amer: 39 mL/min — ABNORMAL LOW (ref >60)
Glucose, Bld: 161 mg/dL — ABNORMAL HIGH (ref 70–99)
Potassium: 2.8 mmol/L — ABNORMAL LOW (ref 3.5–5.1)
Sodium: 134 mmol/L — ABNORMAL LOW (ref 135–145)

## 2018-08-05 LAB — URINALYSIS, COMPLETE (UACMP) WITH MICROSCOPIC
Bacteria, UA: NONE SEEN
Bilirubin Urine: NEGATIVE
Glucose, UA: NEGATIVE mg/dL
Hgb urine dipstick: NEGATIVE
Ketones, ur: NEGATIVE mg/dL
LEUKOCYTES UA: NEGATIVE
Nitrite: NEGATIVE
PH: 5 (ref 5.0–8.0)
Protein, ur: NEGATIVE mg/dL
Specific Gravity, Urine: 1.011 (ref 1.005–1.030)
WBC, UA: NONE SEEN WBC/hpf (ref 0–5)

## 2018-08-05 LAB — CBC
HCT: 35.2 % — ABNORMAL LOW (ref 36.0–46.0)
Hemoglobin: 11.7 g/dL — ABNORMAL LOW (ref 12.0–15.0)
MCH: 27.4 pg (ref 26.0–34.0)
MCHC: 33.2 g/dL (ref 30.0–36.0)
MCV: 82.4 fL (ref 80.0–100.0)
NRBC: 0 % (ref 0.0–0.2)
PLATELETS: 254 10*3/uL (ref 150–400)
RBC: 4.27 MIL/uL (ref 3.87–5.11)
RDW: 13.8 % (ref 11.5–15.5)
WBC: 12.5 10*3/uL — ABNORMAL HIGH (ref 4.0–10.5)

## 2018-08-05 LAB — TROPONIN I
Troponin I: <0.03 ng/mL (ref <0.03)
Troponin I: <0.03 ng/mL (ref <0.03)

## 2018-08-05 LAB — POCT I-STAT, CHEM 8
BUN: 30 mg/dL — AB (ref 8–23)
CHLORIDE: 96 mmol/L — AB (ref 98–111)
Calcium, Ion: 1.09 mmol/L — ABNORMAL LOW (ref 1.15–1.40)
Creatinine, Ser: 1.2 mg/dL — ABNORMAL HIGH (ref 0.44–1.00)
Glucose, Bld: 175 mg/dL — ABNORMAL HIGH (ref 70–99)
HCT: 34 % — ABNORMAL LOW (ref 36.0–46.0)
Hemoglobin: 11.6 g/dL — ABNORMAL LOW (ref 12.0–15.0)
Potassium: 3.3 mmol/L — ABNORMAL LOW (ref 3.5–5.1)
Sodium: 132 mmol/L — ABNORMAL LOW (ref 135–145)
TCO2: 26 mmol/L (ref 22–32)

## 2018-08-05 LAB — GLUCOSE, CAPILLARY: Glucose-Capillary: 154 mg/dL — ABNORMAL HIGH (ref 70–99)

## 2018-08-05 LAB — INFLUENZA PANEL BY PCR (TYPE A & B)
INFLAPCR: NEGATIVE
Influenza B By PCR: NEGATIVE

## 2018-08-05 MED ORDER — MAGNESIUM SULFATE 2 GM/50ML IV SOLN
2.0000 g | Freq: Once | INTRAVENOUS | Status: AC
  Administered 2018-08-05: 2 g via INTRAVENOUS
  Filled 2018-08-05: qty 50

## 2018-08-05 MED ORDER — SODIUM CHLORIDE 0.9 % IV BOLUS
500.0000 mL | Freq: Once | INTRAVENOUS | Status: AC
  Administered 2018-08-05: 500 mL via INTRAVENOUS

## 2018-08-05 MED ORDER — ACETAMINOPHEN 650 MG RE SUPP: 650.0000 mg | Freq: Four times a day (QID) | RECTAL | Status: DC | PRN

## 2018-08-05 MED ORDER — IPRATROPIUM-ALBUTEROL 0.5-2.5 (3) MG/3ML IN SOLN
3.0000 mL | Freq: Once | RESPIRATORY_TRACT | Status: AC
  Administered 2018-08-05: 3 mL via RESPIRATORY_TRACT

## 2018-08-05 MED ORDER — POTASSIUM CHLORIDE 20 MEQ PO PACK
40.0000 meq | PACK | Freq: Once | ORAL | Status: AC
  Administered 2018-08-05: 40 meq via ORAL
  Filled 2018-08-05: qty 2

## 2018-08-05 MED ORDER — POTASSIUM CHLORIDE 10 MEQ/100ML IV SOLN
10.0000 meq | Freq: Once | INTRAVENOUS | Status: AC
  Administered 2018-08-05: 10 meq via INTRAVENOUS
  Filled 2018-08-05: qty 100

## 2018-08-05 MED ORDER — HYDRALAZINE HCL 50 MG PO TABS
50.0000 mg | ORAL_TABLET | Freq: Three times a day (TID) | ORAL | Status: DC
  Administered 2018-08-06 (×4): 50 mg via ORAL
  Filled 2018-08-05 (×5): qty 1

## 2018-08-05 MED ORDER — IPRATROPIUM-ALBUTEROL 0.5-2.5 (3) MG/3ML IN SOLN
3.0000 mL | Freq: Once | RESPIRATORY_TRACT | Status: AC
  Administered 2018-08-05: 3 mL via RESPIRATORY_TRACT
  Filled 2018-08-05: qty 9

## 2018-08-05 MED ORDER — CLOPIDOGREL BISULFATE 75 MG PO TABS
75.0000 mg | ORAL_TABLET | ORAL | Status: DC
  Administered 2018-08-06: 06:00:00 75 mg via ORAL
  Filled 2018-08-05: qty 1

## 2018-08-05 MED ORDER — AZITHROMYCIN 500 MG PO TABS
500.0000 mg | ORAL_TABLET | Freq: Every day | ORAL | Status: DC
  Administered 2018-08-06: 500 mg via ORAL
  Filled 2018-08-05: qty 1

## 2018-08-05 MED ORDER — METHYLPREDNISOLONE SODIUM SUCC 125 MG IJ SOLR
60.0000 mg | Freq: Four times a day (QID) | INTRAMUSCULAR | Status: DC
  Administered 2018-08-06 (×2): 60 mg via INTRAVENOUS
  Filled 2018-08-05 (×2): qty 2

## 2018-08-05 MED ORDER — POTASSIUM CHLORIDE 20 MEQ PO PACK
40.0000 meq | PACK | Freq: Once | ORAL | Status: AC
  Administered 2018-08-06: 01:00:00 40 meq via ORAL
  Filled 2018-08-05: qty 2

## 2018-08-05 MED ORDER — ACETAMINOPHEN 325 MG PO TABS
650.0000 mg | ORAL_TABLET | Freq: Four times a day (QID) | ORAL | Status: DC | PRN
  Administered 2018-08-06: 16:00:00 650 mg via ORAL
  Filled 2018-08-05: qty 2

## 2018-08-05 MED ORDER — ATORVASTATIN CALCIUM 20 MG PO TABS
40.0000 mg | ORAL_TABLET | Freq: Every evening | ORAL | Status: DC
  Administered 2018-08-06: 40 mg via ORAL
  Filled 2018-08-05: qty 2

## 2018-08-05 MED ORDER — LOSARTAN POTASSIUM 50 MG PO TABS
50.0000 mg | ORAL_TABLET | Freq: Two times a day (BID) | ORAL | Status: DC
  Administered 2018-08-06 – 2018-08-07 (×4): 50 mg via ORAL
  Filled 2018-08-05 (×4): qty 1

## 2018-08-05 MED ORDER — METHYLPREDNISOLONE SODIUM SUCC 125 MG IJ SOLR
125.0000 mg | Freq: Once | INTRAMUSCULAR | Status: AC
  Administered 2018-08-05: 125 mg via INTRAVENOUS
  Filled 2018-08-05: qty 2

## 2018-08-05 MED ORDER — GABAPENTIN 300 MG PO CAPS
300.0000 mg | ORAL_CAPSULE | Freq: Two times a day (BID) | ORAL | Status: DC
  Administered 2018-08-06 – 2018-08-07 (×4): 300 mg via ORAL
  Filled 2018-08-05 (×4): qty 1

## 2018-08-05 MED ORDER — PANTOPRAZOLE SODIUM 20 MG PO TBEC
20.0000 mg | DELAYED_RELEASE_TABLET | Freq: Two times a day (BID) | ORAL | Status: DC
  Filled 2018-08-05: qty 1

## 2018-08-05 MED ORDER — PREDNISONE 20 MG PO TABS: 60.0000 mg | ORAL_TABLET | Freq: Every day | ORAL | 0 refills | Status: DC

## 2018-08-05 MED ORDER — PANTOPRAZOLE SODIUM 40 MG PO TBEC
40.0000 mg | DELAYED_RELEASE_TABLET | Freq: Every day | ORAL | Status: DC
  Administered 2018-08-05 – 2018-08-07 (×3): 40 mg via ORAL
  Filled 2018-08-05 (×3): qty 1

## 2018-08-05 MED ORDER — ONDANSETRON HCL 4 MG PO TABS: 4.0000 mg | ORAL_TABLET | Freq: Four times a day (QID) | ORAL | Status: DC | PRN

## 2018-08-05 MED ORDER — ONDANSETRON HCL 4 MG/2ML IJ SOLN: 4.0000 mg | Freq: Four times a day (QID) | INTRAMUSCULAR | Status: DC | PRN

## 2018-08-05 MED ORDER — IPRATROPIUM-ALBUTEROL 0.5-2.5 (3) MG/3ML IN SOLN: 3.0000 mL | RESPIRATORY_TRACT | Status: DC | PRN

## 2018-08-05 MED ORDER — LORAZEPAM 1 MG PO TABS
1.0000 mg | ORAL_TABLET | Freq: Every evening | ORAL | Status: DC | PRN
  Administered 2018-08-06: 1 mg via ORAL
  Filled 2018-08-05: qty 1

## 2018-08-05 MED ORDER — ENOXAPARIN SODIUM 40 MG/0.4ML ~~LOC~~ SOLN
40.0000 mg | SUBCUTANEOUS | Status: DC
  Administered 2018-08-06 (×2): 40 mg via SUBCUTANEOUS
  Filled 2018-08-05 (×2): qty 0.4

## 2018-08-05 NOTE — H&P (Signed)
[facility] - [place] at [facility]   PATIENT NAME: Donna Quinn    MR#:  [id number]  DATE OF BIRTH:  1936-05-31  DATE OF ADMISSION:  08/05/2018  PRIMARY CARE PHYSICIAN: Marguarite Arbour, MD   REQUESTING/REFERRING PHYSICIAN: Don Perking, MD  CHIEF COMPLAINT:   Chief Complaint  Patient presents with  . Weakness    HISTORY OF PRESENT ILLNESS:  Donna Quinn  is a 82 y.o. female who presents with chief complaint as above.  Patient states that she came to the ED today for increasing generalized weakness.  Here in the ED she was found to have some electrolyte abnormalities including low potassium and mild low sodium.  She is also found to have some shortness of breath.  However, the patient states that she has COPD and she always breathes like this.  Although she does state that she has been having more wheezing than normal over the past couple of days.  She also endorses some chest tightness.  Initial work-up in the ED was largely unrevealing except for electrolyte abnormalities and some nonspecific T wave changes on EKG.  Her initial troponin was normal.  Hospitalist were called for admission  PAST MEDICAL HISTORY:   Past Medical History:  Diagnosis Date  . AAA (abdominal aortic aneurysm) (HCC)   . Brain aneurysm   . Collagen vascular disease (HCC)   . COPD (chronic obstructive pulmonary disease) (HCC)   . Dyspnea   . GERD (gastroesophageal reflux disease)   . H/O wheezing   . Hypercholesterolemia   . Hypertension   . Neuropathy   . Osteoporosis   . Stroke [facility]) 2016   twice     PAST SURGICAL HISTORY:   Past Surgical History:  Procedure Laterality Date  . abdominal Bilateral    stents  . BACK SURGERY     cyst removed from back  . BRAIN SURGERY     Brain aneurysm repair, coiling  . CAROTID ENDARTERECTOMY    . CATARACT EXTRACTION W/PHACO Left 07/08/2016   Procedure: CATARACT EXTRACTION PHACO AND INTRAOCULAR LENS PLACEMENT (IOC);  Surgeon:  Nevada Crane, MD;  Location: [facility] ORS;  Service: Ophthalmology;  Laterality: Left;  [place]  01:30 AP% 39.1 CDE 20.3 Fluid pack lot # [id number]  . CATARACT EXTRACTION W/PHACO Right 09/16/2016   Procedure: CATARACT EXTRACTION PHACO AND INTRAOCULAR LENS PLACEMENT (IOC);  Surgeon: Nevada Crane, MD;  Location: [facility] ORS;  Service: Ophthalmology;  Laterality: Right;  Lot # [id number] H [place]: 01"21.7 AP% 16.8: CDE: 13.90  . HIP ARTHROPLASTY Right 10/20/2016   Procedure: ARTHROPLASTY BIPOLAR HIP (HEMIARTHROPLASTY);  Surgeon: Christena Flake, MD;  Location: [facility] ORS;  Service: Orthopedics;  Laterality: Right;     SOCIAL HISTORY:   Social History   Tobacco Use  . Smoking status: Former Smoker    Packs/day: 1.00    Years: 40.00    Pack years: 40.00    Types: Cigarettes  . Smokeless tobacco: Never Used  Substance Use Topics  . Alcohol use: No     FAMILY HISTORY:   Family History  Problem Relation Age of Onset  . Breast cancer Sister 69  . Kidney disease Father      DRUG ALLERGIES:   Allergies  Allergen Reactions  . Aggrenox [Aspirin-Dipyridamole Er] Other (See Comments)    Reaction: A really bad headache  . Augmentin [Amoxicillin-Pot Clavulanate] Other (See Comments)    Reaction: unknown  . Penicillins Other (See Comments)    Has patient had a PCN reaction  causing immediate rash, facial/tongue/throat swelling, SOB or lightheadedness with hypotension: Yes Has patient had a PCN reaction causing severe rash involving mucus membranes or skin necrosis: Yes Has patient had a PCN reaction that required hospitalization No Has patient had a PCN reaction occurring within the last 10 years: No If all of the above answers are "NO", then may proceed with Cephalosporin use.  . Remeron [Mirtazapine] Other (See Comments)    Broke out inside the mouth  . Sulfa Antibiotics Hives and Other (See Comments)    Broke out in welps and had to be hospitalized    MEDICATIONS AT HOME:   Prior to  Admission medications   Medication Sig Start Date End Date Taking? Authorizing Provider  acetaminophen (TYLENOL) 500 MG tablet Take 1 tablet (500 mg total) by mouth every 6 (six) hours as needed for moderate pain or headache. 10/22/16  Yes Gouru, Aruna, MD  atorvastatin (LIPITOR) 40 MG tablet Take 40 mg by mouth every evening.    Yes [provider]  clopidogrel (PLAVIX) 75 MG tablet Take 75 mg by mouth every morning.   Yes [provider]  EASY-LAX PLUS 8.6-50 MG tablet Take 2 tablets by mouth daily as needed for mild constipation.  03/31/18  Yes [provider]  furosemide (LASIX) 20 MG tablet Take 20 mg by mouth daily as needed. 06/06/18 06/06/19 Yes [provider]  gabapentin (NEURONTIN) 300 MG capsule Take 300 mg by mouth 2 (two) times daily.   Yes [provider]  hydrALAZINE (APRESOLINE) 25 MG tablet Take 50 mg by mouth 3 (three) times daily.    Yes [provider]  ipratropium-albuterol (DUONEB) 0.5-2.5 (3) MG/3ML SOLN Inhale 3 mLs into the lungs 4 (four) times daily. 03/31/18  Yes [provider]  LORazepam (ATIVAN) 1 MG tablet Take 1 mg by mouth at bedtime as needed for anxiety or sleep. 05/01/18  Yes [provider]  losartan (COZAAR) 50 MG tablet Take 50 mg by mouth 2 (two) times daily.    Yes [provider]  omeprazole (PRILOSEC) 20 MG capsule Take 20 mg by mouth 2 (two) times daily before a meal.   Yes [provider]  vitamin B-12 (CYANOCOBALAMIN) 1000 MCG tablet Take 1,000 mcg by mouth daily.   Yes [provider]  Vitamin D, Ergocalciferol, (DRISDOL) 50000 units CAPS capsule Take 1 capsule by mouth once a week. Takes on Thursday 02/28/18  Yes [provider]  predniSONE (DELTASONE) 20 MG tablet Take 3 tablets (60 mg total) by mouth daily for 4 days. 08/05/18 08/09/18  Nita Sickle, MD    REVIEW OF SYSTEMS:  Review of Systems  Constitutional: Negative for chills, fever,  malaise/fatigue and weight loss.  HENT: Negative for ear pain, hearing loss and tinnitus.   Eyes: Negative for blurred vision, double vision, pain and redness.  Respiratory: Positive for shortness of breath and wheezing. Negative for cough and hemoptysis.   Cardiovascular: Negative for chest pain, palpitations, orthopnea and leg swelling.  Gastrointestinal: Negative for abdominal pain, constipation, diarrhea, nausea and vomiting.  Genitourinary: Negative for dysuria, frequency and hematuria.  Musculoskeletal: Negative for back pain, joint pain and neck pain.  Skin:       No acne, rash, or lesions  Neurological: Positive for weakness. Negative for dizziness, tremors and focal weakness.  Endo/Heme/Allergies: Negative for polydipsia. Does not bruise/bleed easily.  Psychiatric/Behavioral: Negative for depression. The patient is not nervous/anxious and does not have insomnia.      VITAL SIGNS:  Vitals:   08/05/18 1531 08/05/18 1532 08/05/18 2000 08/05/18 2030  BP:  (!) 156/69 (!) 155/63 (!) 148/61  Pulse: 97  (!) 106 (!) 103  Resp: 16     Temp: 97.7 F (36.5 C)     TempSrc: Oral     SpO2: 95%  97% 97%  Weight:  59 kg    Height:  5\' 7"  (1.702 m)     Wt Readings from Last 3 Encounters:  08/05/18 59 kg  05/03/18 57.8 kg  04/18/18 60.8 kg    PHYSICAL EXAMINATION:  Physical Exam  Vitals reviewed. Constitutional: She is oriented to person, place, and time. She appears well-developed and well-nourished. No distress.  HENT:  Head: Normocephalic and atraumatic.  Mouth/Throat: Oropharynx is clear and moist.  Eyes: Pupils are equal, round, and reactive to light. Conjunctivae and EOM are normal. No scleral icterus.  Neck: Normal range of motion. Neck supple. No JVD present. No thyromegaly present.  Cardiovascular: Normal rate, regular rhythm and intact distal pulses. Exam reveals no gallop and no friction rub.  No murmur heard. Respiratory: Effort normal. No respiratory distress. She  has wheezes. She has no rales.  GI: Soft. Bowel sounds are normal. She exhibits no distension. There is no abdominal tenderness.  Musculoskeletal: Normal range of motion.        General: No edema.     Comments: No arthritis, no gout  Lymphadenopathy:    She has no cervical adenopathy.  Neurological: She is alert and oriented to person, place, and time. No cranial nerve deficit.  No dysarthria, no aphasia  Skin: Skin is warm and dry. No rash noted. No erythema.  Psychiatric: She has a normal mood and affect. Her behavior is normal. Judgment and thought content normal.    LABORATORY PANEL:   CBC Recent Labs  Lab 08/05/18 1534 08/05/18 2158  WBC 12.5*  --   HGB 11.7* 11.6*  HCT 35.2* 34.0*  PLT 254  --    ------------------------------------------------------------------------------------------------------------------  Chemistries  Recent Labs  Lab 08/05/18 1534 08/05/18 2158  NA 134* 132*  K 2.8* 3.3*  CL 96* 96*  CO2 27  --   GLUCOSE 161* 175*  BUN 37* 30*  CREATININE 1.29* 1.20*  CALCIUM 8.9  --    ------------------------------------------------------------------------------------------------------------------  Cardiac Enzymes Recent Labs  Lab 08/05/18 2018  TROPONINI <0.03   ------------------------------------------------------------------------------------------------------------------  RADIOLOGY:  Dg Chest 2 View  Result Date: 08/05/2018 CLINICAL DATA:  pt woke up this morning not feeling well. Pt stated that she felt weak and tired. SOB EXAM: CHEST - 2 VIEW COMPARISON:  05/02/2018 FINDINGS: Heart size is normal there are no focal consolidations or pleural effusions. Note is made of hiatal hernia. Prominent kyphotic curvature of the thoracic spine. There are chronic wedge compression fractures of vertebral bodies. IMPRESSION: 1. No evidence for acute cardiopulmonary abnormality. 2. Hiatal hernia. Electronically Signed   By: Norva PavlovElizabeth  Brown M.D.   On:  08/05/2018 19:12    EKG:   Orders placed or performed during the hospital encounter of 08/05/18  . ED EKG  . ED EKG  . EKG 12-Lead  . EKG 12-Lead    IMPRESSION AND PLAN:  Principal Problem:   COPD exacerbation (HCC) -potentially the cause of her weakness.  We will treat with IV steroids, duo nebs, azithromycin,  PRN antitussive. Active Problems:   Hypokalemia -place and monitor   Weakness -potentially due to her COPD exacerbation.  However, we will check a second troponin, the first was normal.  If her second troponin remains negative, and she does not begin to feel better after replacing her electrolytes and treating for COPD, might consider echocardiogram and further cardiac work-up   HTN (hypertension) -continue home meds   HLD (hyperlipidemia) -Home dose antilipid   GERD (gastroesophageal reflux disease) -home dose PPI  Chart review performed and case discussed with ED provider. Labs, imaging and/or ECG reviewed by provider and discussed with patient/family. Management plans discussed with the patient and/or family.  DVT PROPHYLAXIS: SubQ lovenox   GI PROPHYLAXIS:  PPI   ADMISSION STATUS: Inpatient     CODE STATUS: Full Code Status History    Date Active Date Inactive Code Status Order ID Comments User Context   05/03/2018 0123 05/04/2018 1717 Full Code [id number]  Oralia ManisWillis, Lucky Alverson, MD Inpatient   03/21/2018 1958 03/23/2018 1509 Full Code [id number]  Houston SirenSainani, Vivek J, MD Inpatient   10/19/2016 2151 10/22/2016 1835 Full Code [id number]  Oralia ManisWillis, Alexandr Oehler, MD ED   10/19/2016 2047 10/19/2016 2047 Full Code [id number]  Juanell FairlyKrasinski, Kevin, MD ED   08/06/2016 1744 08/08/2016 1637 Full Code [id number]  Milagros LollSudini, Srikar, MD ED      TOTAL TIME TAKING CARE OF THIS PATIENT: 45 minutes.   Yohana Bartha FIELDING 08/05/2018, 10:49 PM  [facility]  Office  [phone]  CC: Primary care physician; Marguarite ArbourSparks, Jeffrey D, MD  Note:  This document was prepared using Dragon voice  recognition software and may include unintentional dictation errors.

## 2018-08-05 NOTE — ED Notes (Signed)
Patient transported to X-ray 

## 2018-08-05 NOTE — ED Notes (Signed)
Pt unable to tolerate IV K. Rate decreased to a tolerable level.

## 2018-08-05 NOTE — ED Provider Notes (Signed)
[facility] Emergency Department Provider Note  ____________________________________________  Time seen: Approximately 6:44 PM  I have reviewed the triage vital signs and the nursing notes.   HISTORY  Chief Complaint Weakness   HPI Donna Quinn is a 82 y.o. female with history of COPD, hypertension, hyperlipidemia, osteoporosis, CVA, AAA who presents for evaluation of generalized weakness.  According to patient's daughter who is at the bedside, patient has been complaining of generalized weakness all day today. Symptoms started this am, constant, and moderate in intensity.  She has eaten and taken her medications.  She has had no fever, no cough, no dysuria, no vomiting, no diarrhea, no abdominal pain, no chest pain.  Patient is complaining of shortness of breath which she was not at home.  Daughter thinks that is because she has not received her albuterol this afternoon.  Patient denies headache.  Past Medical History:  Diagnosis Date  . AAA (abdominal aortic aneurysm) (HCC)   . Brain aneurysm   . Collagen vascular disease (HCC)   . COPD (chronic obstructive pulmonary disease) (HCC)   . Dyspnea   . GERD (gastroesophageal reflux disease)   . H/O wheezing   . Hypercholesterolemia   . Hypertension   . Neuropathy   . Osteoporosis   . Stroke [facility]) 2016   twice    Patient Active Problem List   Diagnosis Date Noted  . Hyponatremia 05/03/2018  . Dehydration 05/02/2018  . Fracture of femoral neck, right (HCC) 10/19/2016  . HTN (hypertension) 10/19/2016  . HLD (hyperlipidemia) 10/19/2016  . COPD (chronic obstructive pulmonary disease) (HCC) 10/19/2016  . GERD (gastroesophageal reflux disease) 10/19/2016  . COPD exacerbation (HCC) 08/06/2016    Past Surgical History:  Procedure Laterality Date  . abdominal Bilateral    stents  . BACK SURGERY     cyst removed from back  . BRAIN SURGERY     Brain aneurysm repair, coiling  . CAROTID  ENDARTERECTOMY    . CATARACT EXTRACTION W/PHACO Left 07/08/2016   Procedure: CATARACT EXTRACTION PHACO AND INTRAOCULAR LENS PLACEMENT (IOC);  Surgeon: Nevada Crane, MD;  Location: [facility] ORS;  Service: Ophthalmology;  Laterality: Left;  [place]  01:30 AP% 39.1 CDE 20.3 Fluid pack lot # [id number]  . CATARACT EXTRACTION W/PHACO Right 09/16/2016   Procedure: CATARACT EXTRACTION PHACO AND INTRAOCULAR LENS PLACEMENT (IOC);  Surgeon: Nevada Crane, MD;  Location: [facility] ORS;  Service: Ophthalmology;  Laterality: Right;  Lot # [id number] H [place]: 01"21.7 AP% 16.8: CDE: 13.90  . HIP ARTHROPLASTY Right 10/20/2016   Procedure: ARTHROPLASTY BIPOLAR HIP (HEMIARTHROPLASTY);  Surgeon: Christena Flake, MD;  Location: [facility] ORS;  Service: Orthopedics;  Laterality: Right;    Prior to Admission medications   Medication Sig Start Date End Date Taking? Authorizing Provider  acetaminophen (TYLENOL) 500 MG tablet Take 1 tablet (500 mg total) by mouth every 6 (six) hours as needed for moderate pain or headache. 10/22/16  Yes Gouru, Aruna, MD  atorvastatin (LIPITOR) 40 MG tablet Take 40 mg by mouth every evening.    Yes [provider]  clopidogrel (PLAVIX) 75 MG tablet Take 75 mg by mouth every morning.   Yes [provider]  EASY-LAX PLUS 8.6-50 MG tablet Take 2 tablets by mouth daily as needed for mild constipation.  03/31/18  Yes [provider]  furosemide (LASIX) 20 MG tablet Take 20 mg by mouth daily as needed. 06/06/18 06/06/19 Yes [provider]  gabapentin (NEURONTIN) 300 MG capsule Take 300 mg by  mouth 2 (two) times daily.   Yes [provider]  hydrALAZINE (APRESOLINE) 25 MG tablet Take 50 mg by mouth 3 (three) times daily.    Yes [provider]  ipratropium-albuterol (DUONEB) 0.5-2.5 (3) MG/3ML SOLN Inhale 3 mLs into the lungs 4 (four) times daily. 03/31/18  Yes [provider]  LORazepam (ATIVAN) 1 MG tablet Take 1 mg by mouth at bedtime as needed for  anxiety or sleep. 05/01/18  Yes [provider]  losartan (COZAAR) 50 MG tablet Take 50 mg by mouth 2 (two) times daily.    Yes [provider]  omeprazole (PRILOSEC) 20 MG capsule Take 20 mg by mouth 2 (two) times daily before a meal.   Yes [provider]  vitamin B-12 (CYANOCOBALAMIN) 1000 MCG tablet Take 1,000 mcg by mouth daily.   Yes [provider]  Vitamin D, Ergocalciferol, (DRISDOL) 50000 units CAPS capsule Take 1 capsule by mouth once a week. Takes on Thursday 02/28/18  Yes [provider]  predniSONE (DELTASONE) 20 MG tablet Take 3 tablets (60 mg total) by mouth daily for 4 days. 08/05/18 08/09/18  Nita Sickle, MD    Allergies Aggrenox [aspirin-dipyridamole er]; Augmentin [amoxicillin-pot clavulanate]; Penicillins; Remeron [mirtazapine]; and Sulfa antibiotics  Family History  Problem Relation Age of Onset  . Breast cancer Sister 2  . Kidney disease Father     Social History Social History   Tobacco Use  . Smoking status: Former Smoker    Packs/day: 1.00    Years: 40.00    Pack years: 40.00    Types: Cigarettes  . Smokeless tobacco: Never Used  Substance Use Topics  . Alcohol use: No  . Drug use: No    Review of Systems  Constitutional: Negative for fever. + Generalized weakness Eyes: Negative for visual changes. ENT: Negative for sore throat. Neck: No neck pain  Cardiovascular: Negative for chest pain. Respiratory: + shortness of breath. Gastrointestinal: Negative for abdominal pain, vomiting or diarrhea. Genitourinary: Negative for dysuria. Musculoskeletal: Negative for back pain. Skin: Negative for rash. Neurological: Negative for headaches, weakness or numbness. Psych: No SI or HI  ____________________________________________   PHYSICAL EXAM:  VITAL SIGNS: ED Triage Vitals  Enc Vitals Group     BP 08/05/18 1532 (!) 156/69     Pulse Rate 08/05/18 1531 97     Resp 08/05/18 1531 16     Temp 08/05/18  1531 97.7 F (36.5 C)     Temp Source 08/05/18 1531 Oral     SpO2 08/05/18 1531 95 %     Weight 08/05/18 1532 130 lb (59 kg)     Height 08/05/18 1532 5\' 7"  (1.702 m)     Head Circumference --      Peak Flow --      Pain Score 08/05/18 1532 0     Pain Loc --      Pain Edu? --      Excl. in GC? --     Constitutional: Alert and oriented. Well appearing and in no apparent distress. HEENT:      Head: Normocephalic and atraumatic.         Eyes: Conjunctivae are normal. Sclera is non-icteric.       Mouth/Throat: Mucous membranes are moist.       Neck: Supple with no signs of meningismus. Cardiovascular: Regular rate and rhythm. No murmurs, gallops, or rubs. 2+ symmetrical distal pulses are present in all extremities. No JVD. Respiratory: Increased work of breathing, tachypneic, normal sats,  decreased air movement bilaterally with faint wheezes Gastrointestinal: Soft, non tender, and non distended with positive bowel sounds. No rebound or guarding. Musculoskeletal: Nontender with normal range of motion in all extremities. No edema, cyanosis, or erythema of extremities. Neurologic: Normal speech and language. Face is symmetric.  Intact strength and sensation x4. No gross focal neurologic deficits are appreciated. Skin: Skin is warm, dry and intact. No rash noted. Psychiatric: Mood and affect are normal. Speech and behavior are normal.  ____________________________________________   LABS (all labs ordered are listed, but only abnormal results are displayed)  Labs Reviewed  BASIC METABOLIC PANEL - Abnormal; Notable for the following components:      Result Value   Sodium 134 (*)    Potassium 2.8 (*)    Chloride 96 (*)    Glucose, Bld 161 (*)    BUN 37 (*)    Creatinine, Ser 1.29 (*)    GFR calc non Af Amer 39 (*)    GFR calc Af Amer 45 (*)    All other components within normal limits  CBC - Abnormal; Notable for the following components:   WBC 12.5 (*)    Hemoglobin 11.7 (*)     HCT 35.2 (*)    All other components within normal limits  GLUCOSE, CAPILLARY - Abnormal; Notable for the following components:   Glucose-Capillary 154 (*)    All other components within normal limits  INFLUENZA PANEL BY PCR (TYPE A & B)  TROPONIN I  URINALYSIS, COMPLETE (UACMP) WITH MICROSCOPIC  TROPONIN I  CBG MONITORING, ED   ____________________________________________  EKG  ED ECG REPORT I, Nita Sicklearolina Meka Lewan, the attending physician, personally viewed and interpreted this ECG.  Sinus rhythm, rate of 98, LVH, LAFB, prolonged QTC, left axis deviation, no ST elevations, ST depressions on lateral leads, T wave inversions in 1 and aVL.  ST depressions and T wave inversions are new when compared to prior. ____________________________________________  RADIOLOGY  I have personally reviewed the images performed during this visit and I agree with the Radiologist's read.   Interpretation by Radiologist:  Dg Chest 2 View  Result Date: 08/05/2018 CLINICAL DATA:  pt woke up this morning not feeling well. Pt stated that she felt weak and tired. SOB EXAM: CHEST - 2 VIEW COMPARISON:  05/02/2018 FINDINGS: Heart size is normal there are no focal consolidations or pleural effusions. Note is made of hiatal hernia. Prominent kyphotic curvature of the thoracic spine. There are chronic wedge compression fractures of vertebral bodies. IMPRESSION: 1. No evidence for acute cardiopulmonary abnormality. 2. Hiatal hernia. Electronically Signed   By: Norva PavlovElizabeth  Brown M.D.   On: 08/05/2018 19:12      ____________________________________________   PROCEDURES  Procedure(s) performed: None Procedures Critical Care performed:  None ____________________________________________   INITIAL IMPRESSION / ASSESSMENT AND PLAN / ED COURSE   82 y.o. female with history of COPD, hypertension, hyperlipidemia, osteoporosis, CVA, AAA who presents for evaluation of generalized weakness.  Patient is tachypneic with  increased work of breathing however per daughter this is patient's baseline.  She is complaining of shortness of breath, she has diminished air movement bilaterally with wheezing concerning for COPD exacerbation.  Will start duo nebs and Solu-Medrol.  Patient with new ST depressions and flipped T waves on her EKG.  She denies any chest pain.  Troponin is pending.  Potassium is low which could be contributing to some of these EKG changes but also possibly some demand ischemia.  Will replenish potassium p.o. and IV.  Will check for flu.  Chest x-ray to rule out pneumonia.  UA to rule out UTI.  Creatinine is within patient's baseline.  Normal electrolytes other than potassium. Mild leukocytosis.   Clinical Course as of Aug 05 2037  Sat Aug 05, 2018  2036 Negative Flu, normal CXR. Labs showing mild leukocytosis most likely viral URI causing bronchitis vs UTI. UA is pending. 2nd troponin is pending. Plan to repeat EKG after IV K. Care transferred to and plan discussed with Dr. Derrill KayGoodman.   [CV]    Clinical Course User Index [CV] Don PerkingVeronese, [place], MD     As part of my medical decision making, I reviewed the following data within the electronic MEDICAL RECORD NUMBER History obtained from family, Nursing notes reviewed and incorporated, Labs reviewed , EKG interpreted , Old EKG reviewed, Old chart reviewed, Radiograph reviewed , Notes from prior ED visits and [place] Controlled Substance Database    Pertinent labs & imaging results that were available during my care of the patient were reviewed by me and considered in my medical decision making (see chart for details).    ____________________________________________   FINAL CLINICAL IMPRESSION(S) / ED DIAGNOSES  Final diagnoses:  Generalized weakness  COPD exacerbation (HCC)  Hypokalemia      NEW MEDICATIONS STARTED DURING THIS VISIT:  ED Discharge Orders         Ordered    predniSONE (DELTASONE) 20 MG tablet  Daily     08/05/18 2023            Note:  This document was prepared using Dragon voice recognition software and may include unintentional dictation errors.    Nita SickleVeronese, Archie, MD 08/05/18 2038

## 2018-08-05 NOTE — ED Triage Notes (Signed)
Pt to ED the via POV with dtr who states that pt woke up this morning not feeling well. Pt stated that she felt weak and tired. Dtr states that pt was seen recently for same complaint. Pt is in NAD at this time.

## 2018-08-06 ENCOUNTER — Encounter: Payer: Self-pay | Admitting: *Deleted

## 2018-08-06 LAB — BASIC METABOLIC PANEL
Anion gap: 8 (ref 5–15)
BUN: 34 mg/dL — ABNORMAL HIGH (ref 8–23)
CO2: 26 mmol/L (ref 22–32)
Calcium: 8.7 mg/dL — ABNORMAL LOW (ref 8.9–10.3)
Chloride: 100 mmol/L (ref 98–111)
Creatinine, Ser: 1.17 mg/dL — ABNORMAL HIGH (ref 0.44–1.00)
GFR calc Af Amer: 51 mL/min — ABNORMAL LOW (ref >60)
GFR calc non Af Amer: 44 mL/min — ABNORMAL LOW (ref >60)
GLUCOSE: 141 mg/dL — AB (ref 70–99)
Potassium: 4.3 mmol/L (ref 3.5–5.1)
Sodium: 134 mmol/L — ABNORMAL LOW (ref 135–145)

## 2018-08-06 LAB — CBC
HCT: 30.5 % — ABNORMAL LOW (ref 36.0–46.0)
Hemoglobin: 10.1 g/dL — ABNORMAL LOW (ref 12.0–15.0)
MCH: 27.4 pg (ref 26.0–34.0)
MCHC: 33.1 g/dL (ref 30.0–36.0)
MCV: 82.9 fL (ref 80.0–100.0)
Platelets: 212 10*3/uL (ref 150–400)
RBC: 3.68 MIL/uL — ABNORMAL LOW (ref 3.87–5.11)
RDW: 13.6 % (ref 11.5–15.5)
WBC: 8.5 10*3/uL (ref 4.0–10.5)
nRBC: 0 % (ref 0.0–0.2)

## 2018-08-06 LAB — TROPONIN I: Troponin I: 0.03 ng/mL (ref <0.03)

## 2018-08-06 MED ORDER — CLOPIDOGREL BISULFATE 75 MG PO TABS
75.0000 mg | ORAL_TABLET | Freq: Every day | ORAL | Status: DC
  Administered 2018-08-07: 09:00:00 75 mg via ORAL
  Filled 2018-08-06: qty 1

## 2018-08-06 MED ORDER — METHYLPREDNISOLONE SODIUM SUCC 40 MG IJ SOLR
40.0000 mg | Freq: Two times a day (BID) | INTRAMUSCULAR | Status: DC
  Administered 2018-08-06 – 2018-08-07 (×2): 40 mg via INTRAVENOUS
  Filled 2018-08-06 (×2): qty 1

## 2018-08-06 MED ORDER — BUDESONIDE 0.5 MG/2ML IN SUSP
0.5000 mg | Freq: Two times a day (BID) | RESPIRATORY_TRACT | Status: DC
  Administered 2018-08-06 – 2018-08-07 (×2): 0.5 mg via RESPIRATORY_TRACT
  Filled 2018-08-06 (×2): qty 2

## 2018-08-06 MED ORDER — IPRATROPIUM-ALBUTEROL 0.5-2.5 (3) MG/3ML IN SOLN
3.0000 mL | Freq: Four times a day (QID) | RESPIRATORY_TRACT | Status: DC
  Administered 2018-08-06 (×2): 3 mL via RESPIRATORY_TRACT
  Filled 2018-08-06 (×2): qty 3

## 2018-08-06 MED ORDER — ALUM & MAG HYDROXIDE-SIMETH 200-200-20 MG/5ML PO SUSP
30.0000 mL | ORAL | Status: DC | PRN
  Administered 2018-08-06: 30 mL via ORAL
  Filled 2018-08-06: qty 30

## 2018-08-06 NOTE — Plan of Care (Signed)
Pt was unable to void when admitted to the unit. Bladder scan volume was . Pt was in and out cath and removed. Pt report she cannot catch her breath even though pt oxygen is >90%. Pt is currently resting and does not appear to be in any distress. Will continue to monitor.

## 2018-08-06 NOTE — Progress Notes (Signed)
[employer] [facility] [place] [facility]      PATIENT NAME: Dwaine DeterBetty Rhatigan    MR#:  [id number]  DATE OF BIRTH:  04/12/1936  SUBJECTIVE:   Patient admitted to the hospital secondary to generalized weakness, shortness of breath and noted to be in mild COPD exacerbation.  Patient still complaining of some exertional dyspnea and feeling weak but no other acute events overnight.  No other complaints.  REVIEW OF SYSTEMS:    Review of Systems  Constitutional: Negative for chills and fever.  HENT: Negative for congestion and tinnitus.   Eyes: Negative for blurred vision and double vision.  Respiratory: Positive for shortness of breath. Negative for cough and wheezing.   Cardiovascular: Negative for chest pain, orthopnea and PND.  Gastrointestinal: Negative for abdominal pain, diarrhea, nausea and vomiting.  Genitourinary: Negative for dysuria and hematuria.  Neurological: Positive for weakness (generalized). Negative for dizziness, sensory change and focal weakness.  All other systems reviewed and are negative.   Nutrition: Heart Healthy Tolerating Diet: yes Tolerating PT: Eval noted.   DRUG ALLERGIES:   Allergies  Allergen Reactions  . Aggrenox [Aspirin-Dipyridamole Er] Other (See Comments)    Reaction: A really bad headache  . Augmentin [Amoxicillin-Pot Clavulanate] Other (See Comments)    Reaction: unknown  . Penicillins Other (See Comments)    Has patient had a PCN reaction causing immediate rash, facial/tongue/throat swelling, SOB or lightheadedness with hypotension: Yes Has patient had a PCN reaction causing severe rash involving mucus membranes or skin necrosis: Yes Has patient had a PCN reaction that required hospitalization No Has patient had a PCN reaction occurring within the last 10 years: No If all of the above answers are "NO", then may proceed with Cephalosporin use.  . Remeron [Mirtazapine] Other (See Comments)    Broke out inside the mouth  . Sulfa  Antibiotics Hives and Other (See Comments)    Broke out in welps and had to be hospitalized    VITALS:  Blood pressure (!) 164/74, pulse 86, temperature 98.7 F (37.1 C), temperature source Oral, resp. rate 20, height 5\' 7"  (1.702 m), weight 59 kg, SpO2 95 %.  PHYSICAL EXAMINATION:   Physical Exam  GENERAL:  82 y.o.-year-old patient lying in bed in no acute distress.  EYES: Pupils equal, round, reactive to light and accommodation. No scleral icterus. Extraocular muscles intact.  HEENT: Head atraumatic, normocephalic. Oropharynx and nasopharynx clear.  NECK:  Supple, no jugular venous distention. No thyroid enlargement, no tenderness.  LUNGS: Good air entry bilaterally, prolonged inspiratory and expiratory phase, no rales, rhonchi, negative use of accessory muscles. CARDIOVASCULAR: S1, S2 normal. No murmurs, rubs, or gallops.  ABDOMEN: Soft, nontender, nondistended. Bowel sounds present. No organomegaly or mass.  EXTREMITIES: No cyanosis, clubbing or edema b/l.    NEUROLOGIC: Cranial nerves II through XII are intact. No focal Motor or sensory deficits b/l. Globally weak.   PSYCHIATRIC: The patient is alert and oriented x 3.  SKIN: No obvious rash, lesion, or ulcer.    LABORATORY PANEL:   CBC Recent Labs  Lab 08/06/18 0533  WBC 8.5  HGB 10.1*  HCT 30.5*  PLT 212   ------------------------------------------------------------------------------------------------------------------  Chemistries  Recent Labs  Lab 08/06/18 0533  NA 134*  K 4.3  CL 100  CO2 26  GLUCOSE 141*  BUN 34*  CREATININE 1.17*  CALCIUM 8.7*   ------------------------------------------------------------------------------------------------------------------  Cardiac Enzymes Recent Labs  Lab 08/06/18 0533  TROPONINI 0.03*   ------------------------------------------------------------------------------------------------------------------  RADIOLOGY:  Dg Chest 2  View  Result Date:  08/05/2018 CLINICAL DATA:  pt woke up this morning not feeling well. Pt stated that she felt weak and tired. SOB EXAM: CHEST - 2 VIEW COMPARISON:  05/02/2018 FINDINGS: Heart size is normal there are no focal consolidations or pleural effusions. Note is made of hiatal hernia. Prominent kyphotic curvature of the thoracic spine. There are chronic wedge compression fractures of vertebral bodies. IMPRESSION: 1. No evidence for acute cardiopulmonary abnormality. 2. Hiatal hernia. Electronically Signed   By: Norva PavlovElizabeth  Brown M.D.   On: 08/05/2018 19:12     ASSESSMENT AND PLAN:   82 year old female with past medical history of previous CVA, osteoporosis, hypertension, hyperlipidemia, GERD, COPD who presented to the hospital due to weakness, shortness of breath.  1.  COPD exacerbation- secondary to possible underlying bronchitis.  Patient CT chest negative for acute pneumonia. - We will continue IV steroids but will taper them.  Continue scheduled duo nebs, add Pulmicort nebs. -Continue empiric Zithromax. -Assess patient for home oxygen prior to discharge.  2.  Generalized weakness-etiology unclear.  Suspected to be secondary to deconditioning. -Seen by PT and they recommend home health services which we will arrange prior to discharge.  Continue treatment for underlying COPD as mentioned above.  3.  Essential hypertension-continue losartan, hydralazine.  4.  Hyperlipidemia-continue atorvastatin.  5.  Neuropathy-continue gabapentin.  Appreciate PT eval.    All the records are reviewed and case discussed with Care Management/Social Worker. Management plans discussed with the patient, family and they are in agreement.  CODE STATUS: Full code  DVT Prophylaxis: Lovenox  TOTAL TIME TAKING CARE OF THIS PATIENT: 30 minutes.   POSSIBLE D/C IN 1-2 DAYS, DEPENDING ON CLINICAL CONDITION.   [place] SirenSAINANI,VIVEK J M.D on 08/06/2018 at 2:21 PM  Between 7am to 6pm - Pager - [phone]  After 6pm go to  [email or web address] - Social research officer, governmentpassword EPAS [facility]  [facility] [facility]  Office  [phone]  CC: Primary care physician; Marguarite ArbourSparks, Jeffrey D, MD

## 2018-08-06 NOTE — Progress Notes (Signed)
Dr. Sheryle Hailiamond notified of Troponin at 0.03. Will continue to monitor.

## 2018-08-06 NOTE — Evaluation (Signed)
Physical Therapy Evaluation Patient Details Name: Donna Quinn MRN: [id number] DOB: 06/20/1936 Today's Date: 08/06/2018   History of Present Illness  Patient is an 82 year old female admitted with reported weakness. Patient found to have decreased sodium and potassium, also found to have increased SOB.      Clinical Impression  Patient agreeable to PT evaluation, reports some improvement with SOB, reports baseline SOB however this is worse. Patient lives alone and uses walker at baseline. Patient reports having had HHPT and nursing recently ( in the last few months). She requires min assist with supine to sit, and use of bed rails with head of bed elevated. Patient was able to ambulate 20 feet with rw, cga for safety. Gait distance limited by respiratory status and reported 6/10 RPE. Patient will benefit from continued skilled PT [facility] to improve strength, improve activity tolerance for hopeful return home at discharge.        Follow Up Recommendations Home health PT    Equipment Recommendations  None recommended by PT    Recommendations for Other Services       Precautions / Restrictions Precautions Precautions: None Restrictions Weight Bearing Restrictions: No      Mobility  Bed Mobility Overal bed mobility: Modified Independent;Needs Assistance Bed Mobility: Supine to Sit     Supine to sit: Min assist     General bed mobility comments: requires use of rails and head of bed elevated.   Transfers Overall transfer level: Needs assistance Equipment used: Rolling walker (2 wheeled) Transfers: Sit to/from Stand Sit to Stand: Min guard         General transfer comment: patient received verbal cues for safety with sit to stand transfer and required min guard for steadying once standing.   Ambulation/Gait Ambulation/Gait assistance: Modified independent (Device/Increase time);Min guard Gait Distance (Feet): 20 Feet Assistive device: Rolling walker (2  wheeled) Gait Pattern/deviations: Step-to pattern Gait velocity: decreased      Stairs            Wheelchair Mobility    Modified Rankin (Stroke Patients Only)       Balance Overall balance assessment: Modified Independent                                           Pertinent Vitals/Pain Pain Assessment: No/denies pain    Home Living Family/patient expects to be discharged to:: Private residence Living Arrangements: Alone Available Help at Discharge: Family;Other (Comment) Type of Home: House Home Access: Stairs to enter Entrance Stairs-Rails: Can reach both Entrance Stairs-Number of Steps: 1 step, landing, 1 step Home Layout: Two level;Able to live on main level with bedroom/bathroom Home Equipment: Dan HumphreysWalker - 2 wheels;Cane - single point;Grab bars - tub/shower;Shower seat;Bedside commode      Prior Function Level of Independence: Independent with assistive device(s)   Gait / Transfers Assistance Needed: Ambulates with rolling walker  ADL's / Homemaking Assistance Needed: Independent with ADLs but assist required for IADLs  Comments: Pt has not had a fall since hip fx last year, minimal in home activity but able to get out of the home with family occasionally.      Hand Dominance        Extremity/Trunk Assessment   Upper Extremity Assessment Upper Extremity Assessment: Overall WFL for tasks assessed    Lower Extremity Assessment Lower Extremity Assessment: Overall WFL for tasks assessed  Cervical / Trunk Assessment Cervical / Trunk Assessment: Kyphotic  Communication   Communication: No difficulties  Cognition Arousal/Alertness: Awake/alert Behavior During Therapy: WFL for tasks assessed/performed Overall Cognitive Status: Within Functional Limits for tasks assessed                                        General Comments      Exercises     Assessment/Plan    PT Assessment Patient needs continued PT  services  PT Problem List Decreased strength;Decreased mobility;Decreased activity tolerance;Cardiopulmonary status limiting activity;Decreased balance       PT Treatment Interventions Gait training;Therapeutic activities;Therapeutic exercise;Functional mobility training;Patient/family education    PT Goals (Current goals can be found in the Care Plan section)  Acute Rehab PT Goals Patient Stated Goal: to return home PT Goal Formulation: With patient Time For Goal Achievement: 08/20/18 Potential to Achieve Goals: Good    Frequency Min 2X/week   Barriers to discharge        Co-evaluation               AM-PAC PT "6 Clicks" Mobility  Outcome Measure Help needed turning from your back to your side while in a flat bed without using bedrails?: A Little Help needed moving from lying on your back to sitting on the side of a flat bed without using bedrails?: A Little Help needed moving to and from a bed to a chair (including a wheelchair)?: A Little Help needed standing up from a chair using your arms (e.g., wheelchair or bedside chair)?: A Little Help needed to walk in hospital room?: A Little Help needed climbing 3-5 steps with a railing? : A Little 6 Click Score: 18    End of Session Equipment Utilized During Treatment: Gait belt Activity Tolerance: Patient tolerated treatment well;Patient limited by fatigue;Other (comment)(limited by SOB) Patient left: in bed;with call bell/phone within reach;with bed alarm set Nurse Communication: Mobility status PT Visit Diagnosis: Muscle weakness (generalized) (M62.81);Unsteadiness on feet (R26.81);History of falling (Z91.81);Difficulty in walking, not elsewhere classified (R26.2)    Time: [id number] PT Time Calculation (min) (ACUTE ONLY): 15 min   Charges:   PT Evaluation $PT Eval Low Complexity: 1 Low PT Treatments $Gait Training: 8-22 mins        Kemon Devincenzi, PT, GCS 08/06/18,1:30 PM

## 2018-08-07 LAB — BASIC METABOLIC PANEL
Anion gap: 9 (ref 5–15)
BUN: 49 mg/dL — ABNORMAL HIGH (ref 8–23)
CALCIUM: 8.8 mg/dL — AB (ref 8.9–10.3)
CO2: 25 mmol/L (ref 22–32)
Chloride: 99 mmol/L (ref 98–111)
Creatinine, Ser: 1.43 mg/dL — ABNORMAL HIGH (ref 0.44–1.00)
GFR calc Af Amer: 40 mL/min — ABNORMAL LOW (ref >60)
GFR calc non Af Amer: 35 mL/min — ABNORMAL LOW (ref >60)
Glucose, Bld: 110 mg/dL — ABNORMAL HIGH (ref 70–99)
Potassium: 4.7 mmol/L (ref 3.5–5.1)
Sodium: 133 mmol/L — ABNORMAL LOW (ref 135–145)

## 2018-08-07 MED ORDER — ENOXAPARIN SODIUM 30 MG/0.3ML ~~LOC~~ SOLN: 30.0000 mg | SUBCUTANEOUS | Status: DC

## 2018-08-07 MED ORDER — AZITHROMYCIN 250 MG PO TABS: 250.0000 mg | ORAL_TABLET | Freq: Every day | ORAL | 0 refills | Status: AC

## 2018-08-07 MED ORDER — IPRATROPIUM-ALBUTEROL 0.5-2.5 (3) MG/3ML IN SOLN: 3.0000 mL | Freq: Four times a day (QID) | RESPIRATORY_TRACT | Status: DC | PRN

## 2018-08-07 MED ORDER — PREDNISONE 10 MG PO TABS: ORAL_TABLET | ORAL | 0 refills | Status: DC

## 2018-08-07 MED ORDER — IPRATROPIUM-ALBUTEROL 0.5-2.5 (3) MG/3ML IN SOLN: 3.0000 mL | Freq: Three times a day (TID) | RESPIRATORY_TRACT | Status: DC

## 2018-08-07 NOTE — Discharge Summary (Signed)
[employer] [facility] [place] at [facility]   PATIENT NAME: Donna DeterBetty Quinn    MR#:  [id number]  DATE OF BIRTH:  03/20/1936  DATE OF ADMISSION:  08/05/2018 ADMITTING PHYSICIAN: Donna Manisavid Willis, MD  DATE OF DISCHARGE: 08/07/2018  1:18 PM  PRIMARY CARE PHYSICIAN: Donna ArbourSparks, Jeffrey D, MD    ADMISSION DIAGNOSIS:  Hypokalemia [E87.6] COPD exacerbation (HCC) [J44.1] Generalized weakness [R53.1]  DISCHARGE DIAGNOSIS:  Principal Problem:   COPD exacerbation (HCC) Active Problems:   HTN (hypertension)   HLD (hyperlipidemia)   GERD (gastroesophageal reflux disease)   Hypokalemia   COPD with exacerbation (HCC)   SECONDARY DIAGNOSIS:   Past Medical History:  Diagnosis Date  . AAA (abdominal aortic aneurysm) (HCC)   . Brain aneurysm   . Collagen vascular disease (HCC)   . COPD (chronic obstructive pulmonary disease) (HCC)   . Dyspnea   . GERD (gastroesophageal reflux disease)   . H/O wheezing   . Hypercholesterolemia   . Hypertension   . Neuropathy   . Osteoporosis   . Stroke [facility](HCC) 2016   twice    HOSPITAL COURSE:   82 year old female with past medical history of previous CVA, osteoporosis, hypertension, hyperlipidemia, GERD, COPD who presented to the hospital due to weakness, shortness of breath.  1.  COPD exacerbation- secondary to acute bronchitis.  Patient CT chest negative for acute pneumonia. -Patient was treated with IV steroids, scheduled duo nebs, Pulmicort nebs.  She has improved. -She is being discharged on oral prednisone taper and empiric Zithromax for a few days.  She did not qualify for home oxygen.  2.  Generalized weakness- secondary to underlying COPD exacerbation and deconditioning. -Treatment for COPD as mentioned above.  Seen by physical therapy and the recommend home health services which was arranged for her prior to discharge.  3.  Essential hypertension- she will continue losartan, hydralazine.  4.  Hyperlipidemia- she will continue  atorvastatin.  5.  Neuropathy- she will continue gabapentin.'  6. GERD - she will cont. Her Omeprazole.   DISCHARGE CONDITIONS:   Stable.   CONSULTS OBTAINED:    DRUG ALLERGIES:   Allergies  Allergen Reactions  . Aggrenox [Aspirin-Dipyridamole Er] Other (See Comments)    Reaction: A really bad headache  . Augmentin [Amoxicillin-Pot Clavulanate] Other (See Comments)    Reaction: unknown  . Penicillins Other (See Comments)    Has patient had a PCN reaction causing immediate rash, facial/tongue/throat swelling, SOB or lightheadedness with hypotension: Yes Has patient had a PCN reaction causing severe rash involving mucus membranes or skin necrosis: Yes Has patient had a PCN reaction that required hospitalization No Has patient had a PCN reaction occurring within the last 10 years: No If all of the above answers are "NO", then may proceed with Cephalosporin use.  . Remeron [Mirtazapine] Other (See Comments)    Broke out inside the mouth  . Sulfa Antibiotics Hives and Other (See Comments)    Broke out in welps and had to be hospitalized    DISCHARGE MEDICATIONS:   Allergies as of 08/07/2018      Reactions   Aggrenox [aspirin-dipyridamole Er] Other (See Comments)   Reaction: A really bad headache   Augmentin [amoxicillin-pot Clavulanate] Other (See Comments)   Reaction: unknown   Penicillins Other (See Comments)   Has patient had a PCN reaction causing immediate rash, facial/tongue/throat swelling, SOB or lightheadedness with hypotension: Yes Has patient had a PCN reaction causing severe rash involving mucus membranes or skin necrosis: Yes Has patient had a  PCN reaction that required hospitalization No Has patient had a PCN reaction occurring within the last 10 years: No If all of the above answers are "NO", then may proceed with Cephalosporin use.   Remeron [mirtazapine] Other (See Comments)   Broke out inside the mouth   Sulfa Antibiotics Hives, Other (See Comments)    Broke out in welps and had to be hospitalized      Medication List    TAKE these medications   acetaminophen 500 MG tablet Commonly known as:  TYLENOL Take 1 tablet (500 mg total) by mouth every 6 (six) hours as needed for moderate pain or headache.   atorvastatin 40 MG tablet Commonly known as:  LIPITOR Take 40 mg by mouth every evening.   azithromycin 250 MG tablet Commonly known as:  ZITHROMAX Take 1 tablet (250 mg total) by mouth daily for 4 days.   clopidogrel 75 MG tablet Commonly known as:  PLAVIX Take 75 mg by mouth every morning.   EASY-LAX PLUS 8.6-50 MG tablet Generic drug:  senna-docusate Take 2 tablets by mouth daily as needed for mild constipation.   furosemide 20 MG tablet Commonly known as:  LASIX Take 20 mg by mouth daily as needed.   gabapentin 300 MG capsule Commonly known as:  NEURONTIN Take 300 mg by mouth 2 (two) times daily.   hydrALAZINE 25 MG tablet Commonly known as:  APRESOLINE Take 50 mg by mouth 3 (three) times daily.   ipratropium-albuterol 0.5-2.5 (3) MG/3ML Soln Commonly known as:  DUONEB Inhale 3 mLs into the lungs 4 (four) times daily.   LORazepam 1 MG tablet Commonly known as:  ATIVAN Take 1 mg by mouth at bedtime as needed for anxiety or sleep.   losartan 50 MG tablet Commonly known as:  COZAAR Take 50 mg by mouth 2 (two) times daily.   omeprazole 20 MG capsule Commonly known as:  PRILOSEC Take 20 mg by mouth 2 (two) times daily before a meal.   predniSONE 10 MG tablet Commonly known as:  DELTASONE Label  & dispense according to the schedule below. 5 Pills PO for 1 day then, 4 Pills PO for 1 day, 3 Pills PO for 1 day, 2 Pills PO for 1 day, 1 Pill PO for 1 days then STOP.   vitamin B-12 1000 MCG tablet Commonly known as:  CYANOCOBALAMIN Take 1,000 mcg by mouth daily.   Vitamin Quinn (Ergocalciferol) 1.25 MG (50000 UT) Caps capsule Commonly known as:  DRISDOL Take 1 capsule by mouth once a week. Takes on Thursday          DISCHARGE INSTRUCTIONS:   DIET:  Cardiac diet  DISCHARGE CONDITION:  Stable  ACTIVITY:  Activity as tolerated  OXYGEN:  Home Oxygen: No.   Oxygen Delivery: room air  DISCHARGE LOCATION:  Home with Home Health PT.    If you experience worsening of your admission symptoms, develop shortness of breath, life threatening emergency, suicidal or homicidal thoughts you must seek medical attention immediately by calling 911 or calling your MD immediately  if symptoms less severe.  You Must read complete instructions/literature along with all the possible adverse reactions/side effects for all the Medicines you take and that have been prescribed to you. Take any new Medicines after you have completely understood and accpet all the possible adverse reactions/side effects.   Please note  You were cared for by a hospitalist during your hospital stay. If you have any questions about your discharge medications or the care you  received while you were in the hospital after you are discharged, you can call the unit and asked to speak with the hospitalist on call if the hospitalist that took care of you is not available. Once you are discharged, your primary care physician will handle any further medical issues. Please note that NO REFILLS for any discharge medications will be authorized once you are discharged, as it is imperative that you return to your primary care physician (or establish a relationship with a primary care physician if you do not have one) for your aftercare needs so that they can reassess your need for medications and monitor your lab values.     Today   Shortness of breath, weakness has improved since admission.  She is no longer hypoxic.  Ambulated with the help of physical therapy and will discharge home with home health services.  VITAL SIGNS:  Blood pressure (!) 100/36, pulse 76, temperature 98 F (36.7 C), temperature source Oral, resp. rate 18, height 5\' 7"   (1.702 m), weight 59 kg, SpO2 97 %.  I/O:    Intake/Output Summary (Last 24 hours) at 08/07/2018 1522 Last data filed at 08/07/2018 1046 Gross per 24 hour  Intake 360 ml  Output 650 ml  Net -290 ml    PHYSICAL EXAMINATION:   GENERAL:  82 y.o.-year-old patient lying in bed in no acute distress.  EYES: Pupils equal, round, reactive to light and accommodation. No scleral icterus. Extraocular muscles intact.  HEENT: Head atraumatic, normocephalic. Oropharynx and nasopharynx clear.  NECK:  Supple, no jugular venous distention. No thyroid enlargement, no tenderness.  LUNGS: Good air entry bilaterally, prolonged inspiratory and expiratory phase, no rales, rhonchi, negative use of accessory muscles. CARDIOVASCULAR: S1, S2 normal. No murmurs, rubs, or gallops.  ABDOMEN: Soft, nontender, nondistended. Bowel sounds present. No organomegaly or mass.  EXTREMITIES: No cyanosis, clubbing or edema b/l.    NEUROLOGIC: Cranial nerves II through XII are intact. No focal Motor or sensory deficits b/l. PSYCHIATRIC: The patient is alert and oriented x 3.  SKIN: No obvious rash, lesion, or ulcer.  DATA REVIEW:   CBC Recent Labs  Lab 08/06/18 0533  WBC 8.5  HGB 10.1*  HCT 30.5*  PLT 212    Chemistries  Recent Labs  Lab 08/07/18 0318  NA 133*  K 4.7  CL 99  CO2 25  GLUCOSE 110*  BUN 49*  CREATININE 1.43*  CALCIUM 8.8*    Cardiac Enzymes Recent Labs  Lab 08/06/18 0533  TROPONINI 0.03*     RADIOLOGY:  Dg Chest 2 View  Result Date: 08/05/2018 CLINICAL DATA:  pt woke up this morning not feeling well. Pt stated that she felt weak and tired. SOB EXAM: CHEST - 2 VIEW COMPARISON:  05/02/2018 FINDINGS: Heart size is normal there are no focal consolidations or pleural effusions. Note is made of hiatal hernia. Prominent kyphotic curvature of the thoracic spine. There are chronic wedge compression fractures of vertebral bodies. IMPRESSION: 1. No evidence for acute cardiopulmonary  abnormality. 2. Hiatal hernia. Electronically Signed   By: Norva PavlovElizabeth  Brown M.Quinn.   On: 08/05/2018 19:12      Management plans discussed with the patient, family and they are in agreement.  CODE STATUS:     Code Status Orders  (From admission, onward)         Start     Ordered   08/05/18 2354  Full code  Continuous     08/05/18 2353  TOTAL TIME TAKING CARE OF THIS PATIENT: 40 minutes.    [place] Siren M.Quinn on 08/07/2018 at 3:22 PM  Between 7am to 6pm - Pager - [phone]  After 6pm go to [email or web address] - Social research officer, government  [facility]  Office  [phone]  CC: Primary care physician; Donna Arbour, MD

## 2018-08-07 NOTE — Care Management Note (Signed)
Case Management Note  Patient Details  Name: Donna Quinn MRN: [id number] Date of Birth: 01/28/1936  Subjective/Objective: Patient is from home and lives alone.  Patient has a walker at home and her bathroom has pull bars.  PT recommends home health PT and patient is okay with that.  [facility]  [facility] and will set up services.  Patient will be discharged today.   Robbie LisJeanna Mehak Roskelley RN BSN [phone]                    Action/Plan:   Expected Discharge Date:                  Expected Discharge Plan:  Home w Home Health Services  In-House Referral:     Discharge planning Services  CM Consult  Post Acute Care Choice:  Home Health Choice offered to:  Patient  DME Arranged:    DME Agency:     HH Arranged:  PT HH Agency:  [facility]  Status of Service:  Completed, signed off  If discussed at Long Length of Stay Meetings, dates discussed:    Additional Comments:  Donna ButcherJeanna M Onis Markoff, RN 08/07/2018, 12:03 PM

## 2018-08-07 NOTE — Progress Notes (Signed)
PHARMACIST - PHYSICIAN COMMUNICATION  CONCERNING:  Enoxaparin (Lovenox) for DVT Prophylaxis    RECOMMENDATION: Patient was prescribed enoxaprin 40mg  q24 hours for VTE prophylaxis.   Filed Weights   08/05/18 1532  Weight: 130 lb (59 kg)    Body mass index is 20.36 kg/m.  Estimated Creatinine Clearance: 29.2 mL/min (A) (by C-G formula based on SCr of 1.43 mg/dL (H)).   Patient is candidate for enoxaparin 30mg  every 24 hours based on CrCl <6330ml/min or Weight less then 45kg for female and 50kg for female  DESCRIPTION: Pharm[facility]  Patient is now receiving enoxaparin 30mg  every 24 hours.  Orinda Kennerhris A Lavaughn Haberle, PharmD Clinical Pharmacist  08/07/2018 9:49 AM

## 2018-08-07 NOTE — Progress Notes (Signed)
Physical Therapy Treatment Patient Details Name: Donna GildingBetty M Tremper MRN: [id number] DOB: 10/13/1935 Today's Date: 08/07/2018    History of Present Illness Patient is an [age] year old female admitted with reported weakness. Patient found to have decreased sodium and potassium, also found to have increased SOB.      PT Comments    Patient agreeable to walking, reports she pulled off bandage and is bleeding on right forearm. Bandaid applied. Patient progressing with activity this visit able to ambulate 50 feet with rw and supervision, performed bilateral seated exercises. Slight SOB, however improved tolerance from yesterday. Continue skilled PT to improve strength, endurance for return home.        Follow Up Recommendations  Home health PT     Equipment Recommendations       Recommendations for Other Services       Precautions / Restrictions Precautions Precautions: Fall Restrictions Weight Bearing Restrictions: No    Mobility  Bed Mobility Overal bed mobility: Independent Bed Mobility: Supine to Sit     Supine to sit: Independent        Transfers Overall transfer level: Modified independent Equipment used: Rolling walker (2 wheeled) Transfers: Sit to/from Stand Sit to Stand: Supervision            Ambulation/Gait Ambulation/Gait assistance: Modified independent (Device/Increase time);Supervision Gait Distance (Feet): 50 Feet Assistive device: Rolling walker (2 wheeled) Gait Pattern/deviations: WFL(Within Functional Limits) Gait velocity: decreased       Stairs             Wheelchair Mobility    Modified Rankin (Stroke Patients Only)       Balance Overall balance assessment: Modified Independent                                          Cognition Arousal/Alertness: Awake/alert Behavior During Therapy: WFL for tasks assessed/performed Overall Cognitive Status: Within Functional Limits for tasks assessed                                        Exercises Other Exercises Other Exercises: seated LAQ, marching x 10 reps bilaterally     General Comments        Pertinent Vitals/Pain Pain Assessment: No/denies pain    Home Living                      Prior Function            PT Goals (current goals can now be found in the care plan section) Acute Rehab PT Goals Patient Stated Goal: to return home PT Goal Formulation: With patient Time For Goal Achievement: 08/20/18 Potential to Achieve Goals: Good Progress towards PT goals: Progressing toward goals    Frequency    Min 2X/week      PT Plan Current plan remains appropriate    Co-evaluation              AM-PAC PT "6 Clicks" Mobility   Outcome Measure  Help needed turning from your back to your side while in a flat bed without using bedrails?: A Little Help needed moving from lying on your back to sitting on the side of a flat bed without using bedrails?: A Little Help needed moving to and from a bed to a chair (  including a wheelchair)?: A Little Help needed standing up from a chair using your arms (e.g., wheelchair or bedside chair)?: A Little Help needed to walk in hospital room?: A Little Help needed climbing 3-5 steps with a railing? : A Little 6 Click Score: 18    End of Session Equipment Utilized During Treatment: Gait belt Activity Tolerance: Patient tolerated treatment well;Patient limited by fatigue Patient left: in chair;with call bell/phone within reach Nurse Communication: Mobility status PT Visit Diagnosis: Muscle weakness (generalized) (M62.81)     Time: [id number] PT Time Calculation (min) (ACUTE ONLY): 23 min  Charges:  $Gait Training: 8-22 mins $Therapeutic Exercise: 8-22 mins                     Linton Stolp, PT, GCS 08/07/18,9:45 AM

## 2018-08-08 DIAGNOSIS — Z87891 Personal history of nicotine dependence: Secondary | ICD-10-CM | POA: Diagnosis not present

## 2018-08-08 DIAGNOSIS — Z8673 Personal history of transient ischemic attack (TIA), and cerebral infarction without residual deficits: Secondary | ICD-10-CM | POA: Diagnosis not present

## 2018-08-08 DIAGNOSIS — E876 Hypokalemia: Secondary | ICD-10-CM | POA: Diagnosis not present

## 2018-08-08 DIAGNOSIS — Z7952 Long term (current) use of systemic steroids: Secondary | ICD-10-CM | POA: Diagnosis not present

## 2018-08-08 DIAGNOSIS — J441 Chronic obstructive pulmonary disease with (acute) exacerbation: Secondary | ICD-10-CM | POA: Diagnosis not present

## 2018-08-08 DIAGNOSIS — Z9181 History of falling: Secondary | ICD-10-CM | POA: Diagnosis not present

## 2018-08-08 DIAGNOSIS — I10 Essential (primary) hypertension: Secondary | ICD-10-CM | POA: Diagnosis not present

## 2018-08-08 DIAGNOSIS — M6281 Muscle weakness (generalized): Secondary | ICD-10-CM | POA: Diagnosis not present

## 2018-08-08 DIAGNOSIS — G629 Polyneuropathy, unspecified: Secondary | ICD-10-CM | POA: Diagnosis not present

## 2018-08-11 DIAGNOSIS — E876 Hypokalemia: Secondary | ICD-10-CM | POA: Diagnosis not present

## 2018-08-11 DIAGNOSIS — I10 Essential (primary) hypertension: Secondary | ICD-10-CM | POA: Diagnosis not present

## 2018-08-11 DIAGNOSIS — M6281 Muscle weakness (generalized): Secondary | ICD-10-CM | POA: Diagnosis not present

## 2018-08-11 DIAGNOSIS — Z8673 Personal history of transient ischemic attack (TIA), and cerebral infarction without residual deficits: Secondary | ICD-10-CM | POA: Diagnosis not present

## 2018-08-11 DIAGNOSIS — Z87891 Personal history of nicotine dependence: Secondary | ICD-10-CM | POA: Diagnosis not present

## 2018-08-11 DIAGNOSIS — J441 Chronic obstructive pulmonary disease with (acute) exacerbation: Secondary | ICD-10-CM | POA: Diagnosis not present

## 2018-08-11 DIAGNOSIS — Z9181 History of falling: Secondary | ICD-10-CM | POA: Diagnosis not present

## 2018-08-11 DIAGNOSIS — Z7952 Long term (current) use of systemic steroids: Secondary | ICD-10-CM | POA: Diagnosis not present

## 2018-08-11 DIAGNOSIS — G629 Polyneuropathy, unspecified: Secondary | ICD-10-CM | POA: Diagnosis not present

## 2018-08-14 DIAGNOSIS — E876 Hypokalemia: Secondary | ICD-10-CM | POA: Diagnosis not present

## 2018-08-14 DIAGNOSIS — Z9181 History of falling: Secondary | ICD-10-CM | POA: Diagnosis not present

## 2018-08-14 DIAGNOSIS — G629 Polyneuropathy, unspecified: Secondary | ICD-10-CM | POA: Diagnosis not present

## 2018-08-14 DIAGNOSIS — M6281 Muscle weakness (generalized): Secondary | ICD-10-CM | POA: Diagnosis not present

## 2018-08-14 DIAGNOSIS — J441 Chronic obstructive pulmonary disease with (acute) exacerbation: Secondary | ICD-10-CM | POA: Diagnosis not present

## 2018-08-14 DIAGNOSIS — Z7952 Long term (current) use of systemic steroids: Secondary | ICD-10-CM | POA: Diagnosis not present

## 2018-08-14 DIAGNOSIS — I10 Essential (primary) hypertension: Secondary | ICD-10-CM | POA: Diagnosis not present

## 2018-08-14 DIAGNOSIS — Z87891 Personal history of nicotine dependence: Secondary | ICD-10-CM | POA: Diagnosis not present

## 2018-08-14 DIAGNOSIS — Z8673 Personal history of transient ischemic attack (TIA), and cerebral infarction without residual deficits: Secondary | ICD-10-CM | POA: Diagnosis not present

## 2018-08-16 DIAGNOSIS — Z8673 Personal history of transient ischemic attack (TIA), and cerebral infarction without residual deficits: Secondary | ICD-10-CM | POA: Diagnosis not present

## 2018-08-16 DIAGNOSIS — G629 Polyneuropathy, unspecified: Secondary | ICD-10-CM | POA: Diagnosis not present

## 2018-08-16 DIAGNOSIS — M6281 Muscle weakness (generalized): Secondary | ICD-10-CM | POA: Diagnosis not present

## 2018-08-16 DIAGNOSIS — J441 Chronic obstructive pulmonary disease with (acute) exacerbation: Secondary | ICD-10-CM | POA: Diagnosis not present

## 2018-08-16 DIAGNOSIS — Z7952 Long term (current) use of systemic steroids: Secondary | ICD-10-CM | POA: Diagnosis not present

## 2018-08-16 DIAGNOSIS — Z9181 History of falling: Secondary | ICD-10-CM | POA: Diagnosis not present

## 2018-08-16 DIAGNOSIS — E876 Hypokalemia: Secondary | ICD-10-CM | POA: Diagnosis not present

## 2018-08-16 DIAGNOSIS — Z87891 Personal history of nicotine dependence: Secondary | ICD-10-CM | POA: Diagnosis not present

## 2018-08-16 DIAGNOSIS — I10 Essential (primary) hypertension: Secondary | ICD-10-CM | POA: Diagnosis not present

## 2018-08-21 DIAGNOSIS — M6281 Muscle weakness (generalized): Secondary | ICD-10-CM | POA: Diagnosis not present

## 2018-08-21 DIAGNOSIS — G629 Polyneuropathy, unspecified: Secondary | ICD-10-CM | POA: Diagnosis not present

## 2018-08-21 DIAGNOSIS — Z7952 Long term (current) use of systemic steroids: Secondary | ICD-10-CM | POA: Diagnosis not present

## 2018-08-21 DIAGNOSIS — Z87891 Personal history of nicotine dependence: Secondary | ICD-10-CM | POA: Diagnosis not present

## 2018-08-21 DIAGNOSIS — Z8673 Personal history of transient ischemic attack (TIA), and cerebral infarction without residual deficits: Secondary | ICD-10-CM | POA: Diagnosis not present

## 2018-08-21 DIAGNOSIS — Z9181 History of falling: Secondary | ICD-10-CM | POA: Diagnosis not present

## 2018-08-21 DIAGNOSIS — E876 Hypokalemia: Secondary | ICD-10-CM | POA: Diagnosis not present

## 2018-08-21 DIAGNOSIS — J441 Chronic obstructive pulmonary disease with (acute) exacerbation: Secondary | ICD-10-CM | POA: Diagnosis not present

## 2018-08-21 DIAGNOSIS — I10 Essential (primary) hypertension: Secondary | ICD-10-CM | POA: Diagnosis not present

## 2018-08-23 DIAGNOSIS — I10 Essential (primary) hypertension: Secondary | ICD-10-CM | POA: Diagnosis not present

## 2018-08-23 DIAGNOSIS — E876 Hypokalemia: Secondary | ICD-10-CM | POA: Diagnosis not present

## 2018-08-23 DIAGNOSIS — Z8673 Personal history of transient ischemic attack (TIA), and cerebral infarction without residual deficits: Secondary | ICD-10-CM | POA: Diagnosis not present

## 2018-08-23 DIAGNOSIS — Z87891 Personal history of nicotine dependence: Secondary | ICD-10-CM | POA: Diagnosis not present

## 2018-08-23 DIAGNOSIS — Z9181 History of falling: Secondary | ICD-10-CM | POA: Diagnosis not present

## 2018-08-23 DIAGNOSIS — Z7952 Long term (current) use of systemic steroids: Secondary | ICD-10-CM | POA: Diagnosis not present

## 2018-08-23 DIAGNOSIS — G629 Polyneuropathy, unspecified: Secondary | ICD-10-CM | POA: Diagnosis not present

## 2018-08-23 DIAGNOSIS — M6281 Muscle weakness (generalized): Secondary | ICD-10-CM | POA: Diagnosis not present

## 2018-08-23 DIAGNOSIS — J441 Chronic obstructive pulmonary disease with (acute) exacerbation: Secondary | ICD-10-CM | POA: Diagnosis not present

## 2018-08-30 DIAGNOSIS — I10 Essential (primary) hypertension: Secondary | ICD-10-CM | POA: Diagnosis not present

## 2018-08-30 DIAGNOSIS — G629 Polyneuropathy, unspecified: Secondary | ICD-10-CM | POA: Diagnosis not present

## 2018-08-30 DIAGNOSIS — J441 Chronic obstructive pulmonary disease with (acute) exacerbation: Secondary | ICD-10-CM | POA: Diagnosis not present

## 2018-08-30 DIAGNOSIS — M6281 Muscle weakness (generalized): Secondary | ICD-10-CM | POA: Diagnosis not present

## 2018-08-30 DIAGNOSIS — Z7952 Long term (current) use of systemic steroids: Secondary | ICD-10-CM | POA: Diagnosis not present

## 2018-08-30 DIAGNOSIS — Z9181 History of falling: Secondary | ICD-10-CM | POA: Diagnosis not present

## 2018-08-30 DIAGNOSIS — Z87891 Personal history of nicotine dependence: Secondary | ICD-10-CM | POA: Diagnosis not present

## 2018-08-30 DIAGNOSIS — E876 Hypokalemia: Secondary | ICD-10-CM | POA: Diagnosis not present

## 2018-08-30 DIAGNOSIS — Z8673 Personal history of transient ischemic attack (TIA), and cerebral infarction without residual deficits: Secondary | ICD-10-CM | POA: Diagnosis not present

## 2019-01-15 DIAGNOSIS — J431 Panlobular emphysema: Secondary | ICD-10-CM | POA: Diagnosis not present

## 2019-01-15 DIAGNOSIS — R8290 Unspecified abnormal findings in urine: Secondary | ICD-10-CM | POA: Diagnosis not present

## 2019-01-15 DIAGNOSIS — I10 Essential (primary) hypertension: Secondary | ICD-10-CM | POA: Diagnosis not present

## 2019-01-15 DIAGNOSIS — Z79899 Other long term (current) drug therapy: Secondary | ICD-10-CM | POA: Diagnosis not present

## 2019-01-16 DIAGNOSIS — R8290 Unspecified abnormal findings in urine: Secondary | ICD-10-CM | POA: Diagnosis not present

## 2019-01-16 DIAGNOSIS — J431 Panlobular emphysema: Secondary | ICD-10-CM | POA: Diagnosis not present

## 2019-01-16 DIAGNOSIS — I10 Essential (primary) hypertension: Secondary | ICD-10-CM | POA: Diagnosis not present

## 2019-01-16 DIAGNOSIS — Z79899 Other long term (current) drug therapy: Secondary | ICD-10-CM | POA: Diagnosis not present

## 2019-02-26 DIAGNOSIS — M79675 Pain in left toe(s): Secondary | ICD-10-CM | POA: Diagnosis not present

## 2019-02-26 DIAGNOSIS — L97311 Non-pressure chronic ulcer of right ankle limited to breakdown of skin: Secondary | ICD-10-CM | POA: Diagnosis not present

## 2019-02-26 DIAGNOSIS — M79674 Pain in right toe(s): Secondary | ICD-10-CM | POA: Diagnosis not present

## 2019-02-26 DIAGNOSIS — B351 Tinea unguium: Secondary | ICD-10-CM | POA: Diagnosis not present

## 2019-04-18 DIAGNOSIS — E7800 Pure hypercholesterolemia, unspecified: Secondary | ICD-10-CM | POA: Diagnosis not present

## 2019-04-18 DIAGNOSIS — J431 Panlobular emphysema: Secondary | ICD-10-CM | POA: Diagnosis not present

## 2019-04-18 DIAGNOSIS — J449 Chronic obstructive pulmonary disease, unspecified: Secondary | ICD-10-CM | POA: Diagnosis not present

## 2019-04-18 DIAGNOSIS — Z79899 Other long term (current) drug therapy: Secondary | ICD-10-CM | POA: Diagnosis not present

## 2019-04-18 DIAGNOSIS — F3341 Major depressive disorder, recurrent, in partial remission: Secondary | ICD-10-CM | POA: Diagnosis not present

## 2019-04-18 DIAGNOSIS — R7309 Other abnormal glucose: Secondary | ICD-10-CM | POA: Diagnosis not present

## 2019-04-18 DIAGNOSIS — Z87891 Personal history of nicotine dependence: Secondary | ICD-10-CM | POA: Diagnosis not present

## 2019-04-27 DIAGNOSIS — Z23 Encounter for immunization: Secondary | ICD-10-CM | POA: Diagnosis not present

## 2019-05-07 DIAGNOSIS — E785 Hyperlipidemia, unspecified: Secondary | ICD-10-CM | POA: Diagnosis not present

## 2019-05-07 DIAGNOSIS — I714 Abdominal aortic aneurysm, without rupture: Secondary | ICD-10-CM | POA: Diagnosis not present

## 2019-05-07 DIAGNOSIS — G629 Polyneuropathy, unspecified: Secondary | ICD-10-CM | POA: Diagnosis not present

## 2019-05-07 DIAGNOSIS — Z87891 Personal history of nicotine dependence: Secondary | ICD-10-CM | POA: Diagnosis not present

## 2019-05-07 DIAGNOSIS — I739 Peripheral vascular disease, unspecified: Secondary | ICD-10-CM | POA: Diagnosis not present

## 2019-05-07 DIAGNOSIS — J449 Chronic obstructive pulmonary disease, unspecified: Secondary | ICD-10-CM | POA: Diagnosis not present

## 2019-05-07 DIAGNOSIS — I10 Essential (primary) hypertension: Secondary | ICD-10-CM | POA: Diagnosis not present

## 2019-05-07 DIAGNOSIS — I6529 Occlusion and stenosis of unspecified carotid artery: Secondary | ICD-10-CM | POA: Diagnosis not present

## 2019-05-07 DIAGNOSIS — Z8673 Personal history of transient ischemic attack (TIA), and cerebral infarction without residual deficits: Secondary | ICD-10-CM | POA: Diagnosis not present

## 2019-05-15 DIAGNOSIS — J449 Chronic obstructive pulmonary disease, unspecified: Secondary | ICD-10-CM | POA: Diagnosis not present

## 2019-05-30 DIAGNOSIS — J431 Panlobular emphysema: Secondary | ICD-10-CM | POA: Diagnosis not present

## 2019-05-30 DIAGNOSIS — Z87891 Personal history of nicotine dependence: Secondary | ICD-10-CM | POA: Diagnosis not present

## 2019-05-30 DIAGNOSIS — I10 Essential (primary) hypertension: Secondary | ICD-10-CM | POA: Diagnosis not present

## 2019-06-01 DIAGNOSIS — J449 Chronic obstructive pulmonary disease, unspecified: Secondary | ICD-10-CM | POA: Diagnosis not present

## 2019-06-05 DIAGNOSIS — J449 Chronic obstructive pulmonary disease, unspecified: Secondary | ICD-10-CM | POA: Diagnosis not present

## 2019-07-24 DIAGNOSIS — J431 Panlobular emphysema: Secondary | ICD-10-CM | POA: Diagnosis not present

## 2019-07-24 DIAGNOSIS — I739 Peripheral vascular disease, unspecified: Secondary | ICD-10-CM | POA: Diagnosis not present

## 2019-07-24 DIAGNOSIS — Z79899 Other long term (current) drug therapy: Secondary | ICD-10-CM | POA: Diagnosis not present

## 2019-07-24 DIAGNOSIS — I10 Essential (primary) hypertension: Secondary | ICD-10-CM | POA: Diagnosis not present

## 2019-07-27 DIAGNOSIS — R8290 Unspecified abnormal findings in urine: Secondary | ICD-10-CM | POA: Diagnosis not present

## 2019-08-14 ENCOUNTER — Other Ambulatory Visit: Payer: Self-pay

## 2019-08-14 ENCOUNTER — Emergency Department: Payer: Medicare HMO

## 2019-08-14 ENCOUNTER — Observation Stay: Payer: Medicare HMO

## 2019-08-14 ENCOUNTER — Encounter: Payer: Self-pay | Admitting: Emergency Medicine

## 2019-08-14 ENCOUNTER — Inpatient Hospital Stay
Admission: EM | Admit: 2019-08-14 | Discharge: 2019-08-21 | DRG: 177 | Disposition: A | Discharge status: Skilled Nursing Facility | Payer: Medicare HMO | Attending: Hospitalist | Admitting: Hospitalist

## 2019-08-14 DIAGNOSIS — E871 Hypo-osmolality and hyponatremia: Secondary | ICD-10-CM | POA: Diagnosis not present

## 2019-08-14 DIAGNOSIS — E7800 Pure hypercholesterolemia, unspecified: Secondary | ICD-10-CM | POA: Diagnosis present

## 2019-08-14 DIAGNOSIS — N1831 Chronic kidney disease, stage 3a: Secondary | ICD-10-CM | POA: Diagnosis not present

## 2019-08-14 DIAGNOSIS — Z8673 Personal history of transient ischemic attack (TIA), and cerebral infarction without residual deficits: Secondary | ICD-10-CM

## 2019-08-14 DIAGNOSIS — J1282 Pneumonia due to coronavirus disease 2019: Secondary | ICD-10-CM | POA: Diagnosis not present

## 2019-08-14 DIAGNOSIS — K219 Gastro-esophageal reflux disease without esophagitis: Secondary | ICD-10-CM | POA: Diagnosis present

## 2019-08-14 DIAGNOSIS — Z7902 Long term (current) use of antithrombotics/antiplatelets: Secondary | ICD-10-CM | POA: Diagnosis not present

## 2019-08-14 DIAGNOSIS — W19XXXA Unspecified fall, initial encounter: Secondary | ICD-10-CM | POA: Diagnosis not present

## 2019-08-14 DIAGNOSIS — M810 Age-related osteoporosis without current pathological fracture: Secondary | ICD-10-CM | POA: Diagnosis present

## 2019-08-14 DIAGNOSIS — Z96641 Presence of right artificial hip joint: Secondary | ICD-10-CM | POA: Diagnosis present

## 2019-08-14 DIAGNOSIS — U071 COVID-19: Secondary | ICD-10-CM | POA: Diagnosis present

## 2019-08-14 DIAGNOSIS — I248 Other forms of acute ischemic heart disease: Secondary | ICD-10-CM | POA: Diagnosis present

## 2019-08-14 DIAGNOSIS — J440 Chronic obstructive pulmonary disease with acute lower respiratory infection: Secondary | ICD-10-CM | POA: Diagnosis present

## 2019-08-14 DIAGNOSIS — S0990XA Unspecified injury of head, initial encounter: Secondary | ICD-10-CM | POA: Diagnosis not present

## 2019-08-14 DIAGNOSIS — I10 Essential (primary) hypertension: Secondary | ICD-10-CM | POA: Diagnosis not present

## 2019-08-14 DIAGNOSIS — F411 Generalized anxiety disorder: Secondary | ICD-10-CM | POA: Diagnosis not present

## 2019-08-14 DIAGNOSIS — Z7401 Bed confinement status: Secondary | ICD-10-CM | POA: Diagnosis not present

## 2019-08-14 DIAGNOSIS — E785 Hyperlipidemia, unspecified: Secondary | ICD-10-CM | POA: Diagnosis present

## 2019-08-14 DIAGNOSIS — R531 Weakness: Secondary | ICD-10-CM

## 2019-08-14 DIAGNOSIS — J449 Chronic obstructive pulmonary disease, unspecified: Secondary | ICD-10-CM | POA: Diagnosis present

## 2019-08-14 DIAGNOSIS — R0902 Hypoxemia: Secondary | ICD-10-CM | POA: Diagnosis not present

## 2019-08-14 DIAGNOSIS — M2550 Pain in unspecified joint: Secondary | ICD-10-CM | POA: Diagnosis not present

## 2019-08-14 DIAGNOSIS — I639 Cerebral infarction, unspecified: Secondary | ICD-10-CM | POA: Diagnosis not present

## 2019-08-14 DIAGNOSIS — R5381 Other malaise: Secondary | ICD-10-CM | POA: Diagnosis present

## 2019-08-14 DIAGNOSIS — Z87891 Personal history of nicotine dependence: Secondary | ICD-10-CM | POA: Diagnosis not present

## 2019-08-14 DIAGNOSIS — I129 Hypertensive chronic kidney disease with stage 1 through stage 4 chronic kidney disease, or unspecified chronic kidney disease: Secondary | ICD-10-CM | POA: Diagnosis present

## 2019-08-14 DIAGNOSIS — R519 Headache, unspecified: Secondary | ICD-10-CM | POA: Diagnosis not present

## 2019-08-14 DIAGNOSIS — R778 Other specified abnormalities of plasma proteins: Secondary | ICD-10-CM | POA: Diagnosis present

## 2019-08-14 DIAGNOSIS — N183 Chronic kidney disease, stage 3 unspecified: Secondary | ICD-10-CM | POA: Diagnosis present

## 2019-08-14 DIAGNOSIS — Z79899 Other long term (current) drug therapy: Secondary | ICD-10-CM

## 2019-08-14 DIAGNOSIS — S3993XA Unspecified injury of pelvis, initial encounter: Secondary | ICD-10-CM | POA: Diagnosis not present

## 2019-08-14 DIAGNOSIS — R339 Retention of urine, unspecified: Secondary | ICD-10-CM | POA: Diagnosis not present

## 2019-08-14 DIAGNOSIS — R0602 Shortness of breath: Secondary | ICD-10-CM | POA: Diagnosis not present

## 2019-08-14 LAB — BASIC METABOLIC PANEL
Anion gap: 11 (ref 5–15)
Anion gap: 8 (ref 5–15)
BUN: 24 mg/dL — ABNORMAL HIGH (ref 8–23)
BUN: 25 mg/dL — ABNORMAL HIGH (ref 8–23)
CO2: 27 mmol/L (ref 22–32)
CO2: 27 mmol/L (ref 22–32)
Calcium: 8.7 mg/dL — ABNORMAL LOW (ref 8.9–10.3)
Calcium: 8 mg/dL — ABNORMAL LOW (ref 8.9–10.3)
Chloride: 90 mmol/L — ABNORMAL LOW (ref 98–111)
Chloride: 94 mmol/L — ABNORMAL LOW (ref 98–111)
Creatinine, Ser: 1.17 mg/dL — ABNORMAL HIGH (ref 0.44–1.00)
Creatinine, Ser: 1.1 mg/dL — ABNORMAL HIGH (ref 0.44–1.00)
GFR calc Af Amer: 51 mL/min — ABNORMAL LOW (ref >60)
GFR calc Af Amer: 55 mL/min — ABNORMAL LOW (ref >60)
GFR calc non Af Amer: 44 mL/min — ABNORMAL LOW (ref >60)
GFR calc non Af Amer: 47 mL/min — ABNORMAL LOW (ref >60)
Glucose, Bld: 120 mg/dL — ABNORMAL HIGH (ref 70–99)
Glucose, Bld: 86 mg/dL (ref 70–99)
Potassium: 4.5 mmol/L (ref 3.5–5.1)
Potassium: 4.3 mmol/L (ref 3.5–5.1)
Sodium: 128 mmol/L — ABNORMAL LOW (ref 135–145)
Sodium: 129 mmol/L — ABNORMAL LOW (ref 135–145)

## 2019-08-14 LAB — URINALYSIS, COMPLETE (UACMP) WITH MICROSCOPIC
Bacteria, UA: NONE SEEN
Bilirubin Urine: NEGATIVE
Glucose, UA: NEGATIVE mg/dL
Hgb urine dipstick: NEGATIVE
Ketones, ur: NEGATIVE mg/dL
Leukocytes,Ua: NEGATIVE
Nitrite: NEGATIVE
Protein, ur: 30 mg/dL — AB
Specific Gravity, Urine: 1.013 (ref 1.005–1.030)
pH: 6 (ref 5.0–8.0)

## 2019-08-14 LAB — D-DIMER, QUANTITATIVE
D-Dimer, Quant: 2.26 ug/mL-FEU — ABNORMAL HIGH (ref 0.00–0.50)
D-Dimer, Quant: 1.87 ug/mL-FEU — ABNORMAL HIGH (ref 0.00–0.50)

## 2019-08-14 LAB — CBC
HCT: 31.9 % — ABNORMAL LOW (ref 36.0–46.0)
Hemoglobin: 10.5 g/dL — ABNORMAL LOW (ref 12.0–15.0)
MCH: 27.1 pg (ref 26.0–34.0)
MCHC: 32.9 g/dL (ref 30.0–36.0)
MCV: 82.2 fL (ref 80.0–100.0)
Platelets: 246 10*3/uL (ref 150–400)
RBC: 3.88 MIL/uL (ref 3.87–5.11)
RDW: 14.2 % (ref 11.5–15.5)
WBC: 4.9 10*3/uL (ref 4.0–10.5)
nRBC: 0 % (ref 0.0–0.2)

## 2019-08-14 LAB — TROPONIN I (HIGH SENSITIVITY)
Troponin I (High Sensitivity): 165 ng/L (ref <18)
Troponin I (High Sensitivity): 180 ng/L (ref <18)
Troponin I (High Sensitivity): 179 ng/L (ref <18)

## 2019-08-14 LAB — C-REACTIVE PROTEIN: CRP: 11.1 mg/dL — ABNORMAL HIGH (ref <1.0)

## 2019-08-14 LAB — LACTIC ACID, PLASMA
Lactic Acid, Venous: 1 mmol/L (ref 0.5–1.9)
Lactic Acid, Venous: 1.1 mmol/L (ref 0.5–1.9)

## 2019-08-14 LAB — OSMOLALITY, URINE: Osmolality, Ur: 372 mOsm/kg (ref 300–900)

## 2019-08-14 LAB — OSMOLALITY: Osmolality: 271 mOsm/kg — ABNORMAL LOW (ref 275–295)

## 2019-08-14 LAB — HEPATITIS B SURFACE ANTIGEN: Hepatitis B Surface Ag: NONREACTIVE

## 2019-08-14 LAB — LACTATE DEHYDROGENASE: LDH: 155 U/L (ref 98–192)

## 2019-08-14 LAB — TRIGLYCERIDES: Triglycerides: 46 mg/dL (ref <150)

## 2019-08-14 LAB — POC SARS CORONAVIRUS 2 AG: SARS Coronavirus 2 Ag: POSITIVE — AB

## 2019-08-14 LAB — PROCALCITONIN: Procalcitonin: 0.11 ng/mL

## 2019-08-14 LAB — FIBRINOGEN: Fibrinogen: 606 mg/dL — ABNORMAL HIGH (ref 210–475)

## 2019-08-14 LAB — FERRITIN: Ferritin: 555 ng/mL — ABNORMAL HIGH (ref 11–307)

## 2019-08-14 LAB — BRAIN NATRIURETIC PEPTIDE: B Natriuretic Peptide: 577 pg/mL — ABNORMAL HIGH (ref 0.0–100.0)

## 2019-08-14 LAB — SODIUM, URINE, RANDOM: Sodium, Ur: 77 mmol/L

## 2019-08-14 MED ORDER — AMITRIPTYLINE HCL 25 MG PO TABS
25.0000 mg | ORAL_TABLET | Freq: Every day | ORAL | Status: DC
  Administered 2019-08-15 – 2019-08-20 (×6): 25 mg via ORAL
  Filled 2019-08-14 (×6): qty 1

## 2019-08-14 MED ORDER — IPRATROPIUM BROMIDE HFA 17 MCG/ACT IN AERS
2.0000 | INHALATION_SPRAY | Freq: Four times a day (QID) | RESPIRATORY_TRACT | Status: DC
  Administered 2019-08-14 – 2019-08-21 (×23): 2 via RESPIRATORY_TRACT
  Filled 2019-08-14 (×5): qty 12.9

## 2019-08-14 MED ORDER — HYDRALAZINE HCL 50 MG PO TABS
25.0000 mg | ORAL_TABLET | Freq: Three times a day (TID) | ORAL | Status: DC | PRN
  Filled 2019-08-14 (×2): qty 1

## 2019-08-14 MED ORDER — VITAMIN B-12 1000 MCG PO TABS
1000.0000 ug | ORAL_TABLET | Freq: Every day | ORAL | Status: DC
  Administered 2019-08-15 – 2019-08-21 (×7): 1000 ug via ORAL
  Filled 2019-08-14 (×7): qty 1

## 2019-08-14 MED ORDER — ACETAMINOPHEN 325 MG PO TABS: 650.0000 mg | ORAL_TABLET | Freq: Four times a day (QID) | ORAL | Status: DC | PRN

## 2019-08-14 MED ORDER — ONDANSETRON HCL 4 MG/2ML IJ SOLN: 4.0000 mg | Freq: Three times a day (TID) | INTRAMUSCULAR | Status: DC | PRN

## 2019-08-14 MED ORDER — SODIUM CHLORIDE 0.9 % IV SOLN: INTRAVENOUS | Status: DC

## 2019-08-14 MED ORDER — ZINC SULFATE 220 (50 ZN) MG PO CAPS
220.0000 mg | ORAL_CAPSULE | Freq: Every day | ORAL | Status: DC
  Administered 2019-08-14 – 2019-08-21 (×8): 220 mg via ORAL
  Filled 2019-08-14 (×8): qty 1

## 2019-08-14 MED ORDER — ACETAMINOPHEN 500 MG PO TABS
1000.0000 mg | ORAL_TABLET | Freq: Once | ORAL | Status: AC
  Administered 2019-08-14: 1000 mg via ORAL
  Filled 2019-08-14: qty 2

## 2019-08-14 MED ORDER — DM-GUAIFENESIN ER 30-600 MG PO TB12
1.0000 | ORAL_TABLET | Freq: Two times a day (BID) | ORAL | Status: DC
  Administered 2019-08-15 – 2019-08-21 (×13): 1 via ORAL
  Filled 2019-08-14 (×15): qty 1

## 2019-08-14 MED ORDER — LORAZEPAM 1 MG PO TABS: 1.0000 mg | ORAL_TABLET | Freq: Every evening | ORAL | Status: DC | PRN

## 2019-08-14 MED ORDER — CLOPIDOGREL BISULFATE 75 MG PO TABS
75.0000 mg | ORAL_TABLET | ORAL | Status: DC
  Administered 2019-08-15 – 2019-08-19 (×5): 75 mg via ORAL
  Filled 2019-08-14 (×5): qty 1

## 2019-08-14 MED ORDER — SODIUM CHLORIDE 0.9% FLUSH
3.0000 mL | Freq: Once | INTRAVENOUS | Status: AC
  Administered 2019-08-14: 3 mL via INTRAVENOUS

## 2019-08-14 MED ORDER — ASCORBIC ACID 500 MG PO TABS
500.0000 mg | ORAL_TABLET | Freq: Every day | ORAL | Status: DC
  Administered 2019-08-14 – 2019-08-21 (×8): 500 mg via ORAL
  Filled 2019-08-14 (×8): qty 1

## 2019-08-14 MED ORDER — ATORVASTATIN CALCIUM 20 MG PO TABS
40.0000 mg | ORAL_TABLET | Freq: Every evening | ORAL | Status: DC
  Administered 2019-08-15 – 2019-08-20 (×6): 40 mg via ORAL
  Filled 2019-08-14 (×4): qty 2

## 2019-08-14 MED ORDER — MEGESTROL ACETATE 400 MG/10ML PO SUSP
400.0000 mg | Freq: Every day | ORAL | Status: DC
  Administered 2019-08-15 – 2019-08-21 (×7): 400 mg via ORAL
  Filled 2019-08-14 (×7): qty 10

## 2019-08-14 MED ORDER — AMLODIPINE BESYLATE 10 MG PO TABS
10.0000 mg | ORAL_TABLET | Freq: Every day | ORAL | Status: DC
  Administered 2019-08-14 – 2019-08-15 (×2): 10 mg via ORAL
  Filled 2019-08-14: qty 2
  Filled 2019-08-14: qty 1

## 2019-08-14 MED ORDER — ALBUTEROL SULFATE HFA 108 (90 BASE) MCG/ACT IN AERS
2.0000 | INHALATION_SPRAY | RESPIRATORY_TRACT | Status: DC | PRN
  Administered 2019-08-16 – 2019-08-21 (×5): 2 via RESPIRATORY_TRACT
  Filled 2019-08-14: qty 6.7

## 2019-08-14 MED ORDER — SENNOSIDES-DOCUSATE SODIUM 8.6-50 MG PO TABS: 2.0000 | ORAL_TABLET | Freq: Every day | ORAL | Status: DC | PRN

## 2019-08-14 MED ORDER — DULOXETINE HCL 20 MG PO CPEP
20.0000 mg | ORAL_CAPSULE | Freq: Every day | ORAL | Status: DC
  Administered 2019-08-15 – 2019-08-21 (×7): 20 mg via ORAL
  Filled 2019-08-14 (×8): qty 1

## 2019-08-14 MED ORDER — PANTOPRAZOLE SODIUM 40 MG PO TBEC
40.0000 mg | DELAYED_RELEASE_TABLET | Freq: Every day | ORAL | Status: DC
  Administered 2019-08-15 – 2019-08-21 (×7): 40 mg via ORAL
  Filled 2019-08-14 (×7): qty 1

## 2019-08-14 MED ORDER — UMECLIDINIUM-VILANTEROL 62.5-25 MCG/INH IN AEPB
1.0000 | INHALATION_SPRAY | Freq: Every day | RESPIRATORY_TRACT | Status: DC
  Administered 2019-08-15 – 2019-08-21 (×7): 1 via RESPIRATORY_TRACT
  Filled 2019-08-14: qty 14

## 2019-08-14 MED ORDER — GABAPENTIN 300 MG PO CAPS
300.0000 mg | ORAL_CAPSULE | Freq: Two times a day (BID) | ORAL | Status: DC
  Administered 2019-08-15 – 2019-08-21 (×13): 300 mg via ORAL
  Filled 2019-08-14 (×13): qty 1

## 2019-08-14 MED ORDER — HYDRALAZINE HCL 50 MG PO TABS
100.0000 mg | ORAL_TABLET | Freq: Two times a day (BID) | ORAL | Status: DC
  Administered 2019-08-14 – 2019-08-21 (×13): 100 mg via ORAL
  Filled 2019-08-14 (×13): qty 2

## 2019-08-14 MED ORDER — OXYCODONE-ACETAMINOPHEN 5-325 MG PO TABS
1.0000 | ORAL_TABLET | ORAL | Status: DC | PRN
  Administered 2019-08-14 – 2019-08-15 (×2): 1 via ORAL
  Filled 2019-08-14 (×2): qty 1

## 2019-08-14 MED ORDER — ENOXAPARIN SODIUM 40 MG/0.4ML ~~LOC~~ SOLN
40.0000 mg | SUBCUTANEOUS | Status: DC
  Administered 2019-08-14 – 2019-08-20 (×7): 40 mg via SUBCUTANEOUS
  Filled 2019-08-14 (×6): qty 0.4

## 2019-08-14 NOTE — ED Notes (Signed)
Daughter updated on pt's plan of care. 

## 2019-08-14 NOTE — ED Notes (Signed)
Pt with urge to urinate. Pt placed on bedpan with assistance of this tech and Collyn,RN. Pt was on bedpan about eight minutes and was able to obtain urine specimen d/t pt not being able to urinate. Pt transported to XR at this time. Collyn,RN made aware that pt was unable to urinate.

## 2019-08-14 NOTE — ED Notes (Signed)
Per pt, she was at home standing and going through her drawers when she lost her balance and fell backwards. She reports she has not fallen in a long time. Pt able to stand and pivot to bed from wheelchair

## 2019-08-14 NOTE — ED Notes (Signed)
Called pharmacy to request med verification

## 2019-08-14 NOTE — Plan of Care (Signed)

## 2019-08-14 NOTE — ED Notes (Signed)
Patient transported to CT 

## 2019-08-14 NOTE — H&P (Addendum)
History and Physical    JANEAL ABADI QIW:[id number] DOB: [birth year] DOA: 08/14/2019  Referring MD/NP/PA:   PCP: Marguarite Arbour, MD   Patient coming from:  The patient is coming from home.  At baseline, pt is independent for most of ADL.        Chief Complaint: Generalized weakness and fall  HPI: Donna Quinn is a 84 y.o. female with medical history significant of hypertension, hyperlipidemia, COPD, stroke, GERD, anxiety, AAA, brain aneurysm CKD 3, who presents with generalized weakness fall.  Patient states that she has been having generalized weakness for more than 2 days. She feels so weak that she fell at early morning today. No LOC or head injury. She has some pelvic pain. She states that she has some mild SOB and mild cough in the past few days, no fever or chills. No CP.  No nausea vomiting, diarrhea or abdominal pain no symptoms of UTI or unilateral weakness.  ED Course: pt was found to have positive Covid Ag, WBC 4.9, lactic acid 1.1, stable renal function, sodium 128, temperature normal, blood pressure 180/70, heart rate 65, RR 16, oxygen saturation 97-99% on room air.  Chest x-ray showed possible right basilar infiltration.  X-ray of pelvis negative for fracture. Patient is placed on MedSurg bed for observation.  Review of Systems:   General: no fevers, chills, no body weight gain, has fatigue and generalized weakness HEENT: no blurry vision, hearing changes or sore throat Respiratory: no dyspnea, coughing, wheezing CV: no chest pain, no palpitations GI: no nausea, vomiting, abdominal pain, diarrhea, constipation GU: no dysuria, burning on urination, increased urinary frequency, hematuria  Ext: no leg edema Neuro: no unilateral weakness, numbness, or tingling, no vision change or hearing loss. Has fall. Skin: no rash, no skin tear. MSK: No muscle spasm, no deformity, no limitation of range of movement in spin. Has pelvic pain. Heme: No easy bruising.  Travel history:  No recent long distant travel.  Allergy:  Allergies  Allergen Reactions  . Aggrenox [Aspirin-Dipyridamole Er] Other (See Comments)    Reaction: A really bad headache  . Augmentin [Amoxicillin-Pot Clavulanate] Other (See Comments)    Reaction: unknown  . Penicillins Other (See Comments)    Has patient had a PCN reaction causing immediate rash, facial/tongue/throat swelling, SOB or lightheadedness with hypotension: Yes Has patient had a PCN reaction causing severe rash involving mucus membranes or skin necrosis: Yes Has patient had a PCN reaction that required hospitalization No Has patient had a PCN reaction occurring within the last 10 years: No If all of the above answers are "NO", then may proceed with Cephalosporin use.  . Remeron [Mirtazapine] Other (See Comments)    Broke out inside the mouth  . Sulfa Antibiotics Hives and Other (See Comments)    Broke out in welps and had to be hospitalized    Past Medical History:  Diagnosis Date  . AAA (abdominal aortic aneurysm) (HCC)   . Brain aneurysm   . Collagen vascular disease (HCC)   . COPD (chronic obstructive pulmonary disease) (HCC)   . Dyspnea   . GERD (gastroesophageal reflux disease)   . H/O wheezing   . Hypercholesterolemia   . Hypertension   . Neuropathy   . Osteoporosis   . Stroke [facility]) 2016   twice    Past Surgical History:  Procedure Laterality Date  . abdominal Bilateral    stents  . BACK SURGERY     cyst removed from back  . BRAIN SURGERY  Brain aneurysm repair, coiling  . CAROTID ENDARTERECTOMY    . CATARACT EXTRACTION W/PHACO Left 07/08/2016   Procedure: CATARACT EXTRACTION PHACO AND INTRAOCULAR LENS PLACEMENT (IOC);  Surgeon: Eulogio Bear, MD;  Location: [facility] ORS;  Service: Ophthalmology;  Laterality: Left;  [place]  01:30 AP% 39.1 CDE 20.3 Fluid pack lot # [id number]  . CATARACT EXTRACTION W/PHACO Right 09/16/2016   Procedure: CATARACT EXTRACTION PHACO AND INTRAOCULAR LENS PLACEMENT (IOC);  Surgeon:  Eulogio Bear, MD;  Location: [facility] ORS;  Service: Ophthalmology;  Laterality: Right;  Lot # [id number] H [place]: 01"21.7 AP% 16.8: CDE: 13.90  . HIP ARTHROPLASTY Right 10/20/2016   Procedure: ARTHROPLASTY BIPOLAR HIP (HEMIARTHROPLASTY);  Surgeon: Corky Mull, MD;  Location: [facility] ORS;  Service: Orthopedics;  Laterality: Right;    Social History:  reports that she has quit smoking. Her smoking use included cigarettes. She has a 40.00 pack-year smoking history. She has never used smokeless tobacco. She reports that she does not drink alcohol or use drugs.  Family History:  Family History  Problem Relation Age of Onset  . Breast cancer Sister 53  . Kidney disease Father      Prior to Admission medications   Medication Sig Start Date End Date Taking? Authorizing Provider  acetaminophen (TYLENOL) 500 MG tablet Take 1 tablet (500 mg total) by mouth every 6 (six) hours as needed for moderate pain or headache. 10/22/16   Gouru, Illene Silver, MD  atorvastatin (LIPITOR) 40 MG tablet Take 40 mg by mouth every evening.     [provider]  clopidogrel (PLAVIX) 75 MG tablet Take 75 mg by mouth every morning.    [provider]  EASY-LAX PLUS 8.6-50 MG tablet Take 2 tablets by mouth daily as needed for mild constipation.  03/31/18   [provider]  furosemide (LASIX) 20 MG tablet Take 20 mg by mouth daily as needed. 06/06/18 06/06/19  [provider]  gabapentin (NEURONTIN) 300 MG capsule Take 300 mg by mouth 2 (two) times daily.    [provider]  hydrALAZINE (APRESOLINE) 25 MG tablet Take 50 mg by mouth 3 (three) times daily.     [provider]  ipratropium-albuterol (DUONEB) 0.5-2.5 (3) MG/3ML SOLN Inhale 3 mLs into the lungs 4 (four) times daily. 03/31/18   [provider]  LORazepam (ATIVAN) 1 MG tablet Take 1 mg by mouth at bedtime as needed for anxiety or sleep. 05/01/18   [provider]  losartan (COZAAR) 50 MG tablet Take 50 mg by  mouth 2 (two) times daily.     [provider]  omeprazole (PRILOSEC) 20 MG capsule Take 20 mg by mouth 2 (two) times daily before a meal.    [provider]  predniSONE (DELTASONE) 10 MG tablet Label  & dispense according to the schedule below. 5 Pills PO for 1 day then, 4 Pills PO for 1 day, 3 Pills PO for 1 day, 2 Pills PO for 1 day, 1 Pill PO for 1 days then STOP. 08/07/18   Henreitta Leber, MD  vitamin B-12 (CYANOCOBALAMIN) 1000 MCG tablet Take 1,000 mcg by mouth daily.    [provider]  Vitamin D, Ergocalciferol, (DRISDOL) 50000 units CAPS capsule Take 1 capsule by mouth once a week. Takes on Thursday 02/28/18   [provider]    Physical Exam: Vitals:   08/14/19 1224 08/14/19 1230 08/14/19 1300 08/14/19 1330  BP:  (!) 180/70 (!) 175/83 (!) 141/62  Pulse:  65 75 (!) 58  Resp:  16 (!) 24 14  Temp: 98.5 F (36.9 C)     TempSrc: Oral     SpO2:  99% 98% 96%  Weight:      Height:       General: Not in acute distress HEENT:       Eyes: PERRL, EOMI, no scleral icterus.       ENT: No discharge from the ears and nose, no pharynx injection, no tonsillar enlargement.        Neck: No JVD, no bruit, no mass felt. Heme: No neck lymph node enlargement. Cardiac: S1/S2, RRR, No murmurs, No gallops or rubs. Respiratory: No rales, wheezing, rhonchi or rubs. GI: Soft, nondistended, nontender, no rebound pain, no organomegaly, BS present. GU: No hematuria Ext: No pitting leg edema bilaterally. 2+DP/PT pulse bilaterally. Musculoskeletal: has pelvic tenderness Skin: No rashes.  Neuro: Alert, oriented X3, cranial nerves II-XII grossly intact, moves all extremities. Psych: Patient is not psychotic, no suicidal or hemocidal ideation.  Labs on Admission: I have personally reviewed following labs and imaging studies  CBC: Recent Labs  Lab 08/14/19 0940  WBC 4.9  HGB 10.5*  HCT 31.9*  MCV 82.2  PLT 246   Basic Metabolic Panel: Recent Labs  Lab  08/14/19 0940  NA 128*  K 4.5  CL 90*  CO2 27  GLUCOSE 120*  BUN 24*  CREATININE 1.17*  CALCIUM 8.7*   GFR: Estimated Creatinine Clearance: 31.1 mL/min (A) (by C-G formula based on SCr of 1.17 mg/dL (H)). Liver Function Tests: No results for input(s): AST, ALT, ALKPHOS, BILITOT, PROT, ALBUMIN in the last 168 hours. No results for input(s): LIPASE, AMYLASE in the last 168 hours. No results for input(s): AMMONIA in the last 168 hours. Coagulation Profile: No results for input(s): INR, PROTIME in the last 168 hours. Cardiac Enzymes: No results for input(s): CKTOTAL, CKMB, CKMBINDEX, TROPONINI in the last 168 hours. BNP (last 3 results) No results for input(s): PROBNP in the last 8760 hours. HbA1C: No results for input(s): HGBA1C in the last 72 hours. CBG: No results for input(s): GLUCAP in the last 168 hours. Lipid Profile: Recent Labs    08/14/19 1518  TRIG 46   Thyroid Function Tests: No results for input(s): TSH, T4TOTAL, FREET4, T3FREE, THYROIDAB in the last 72 hours. Anemia Panel: Recent Labs    08/14/19 0940  FERRITIN 555*   Urine analysis:    Component Value Date/Time   COLORURINE YELLOW (A) 08/14/2019 0950   APPEARANCEUR CLEAR (A) 08/14/2019 0950   LABSPEC 1.013 08/14/2019 0950   PHURINE 6.0 08/14/2019 0950   GLUCOSEU NEGATIVE 08/14/2019 0950   HGBUR NEGATIVE 08/14/2019 0950   BILIRUBINUR NEGATIVE 08/14/2019 0950   KETONESUR NEGATIVE 08/14/2019 0950   PROTEINUR 30 (A) 08/14/2019 0950   NITRITE NEGATIVE 08/14/2019 0950   LEUKOCYTESUR NEGATIVE 08/14/2019 0950   Sepsis Labs: @LABRCNTIP (procalcitonin:4,lacticidven:4) )No results found for this or any previous visit (from the past 240 hour(s)).   Radiological Exams on Admission: DG Chest 2 View  Result Date: 08/14/2019 CLINICAL DATA:  Shortness of breath. EXAM: CHEST - 2 VIEW COMPARISON:  08/05/2018.  CT 03/21/2018. FINDINGS: Mediastinum and hilar structures normal. Right base infiltrate consistent with  pneumonia. Small right pleural effusion. No pneumothorax. Biapical pleural thickening consistent scarring. Cardiomegaly with normal pulmonary vascularity. Aortic and peripheral vascular calcification. No acute bony abnormality identified. IMPRESSION: 1. Right base infiltrate consistent pneumonia. Small right pleural effusion. 2.  Cardiomegaly.  No pulmonary venous congestion. 3.  Sliding hiatal hernia again noted.  Electronically Signed   By: Maisie Fus  Register   On: 08/14/2019 10:31   DG Pelvis 1-2 Views  Result Date: 08/14/2019 CLINICAL DATA:  Fall. EXAM: PELVIS - 1-2 VIEW COMPARISON:  05/02/2018. FINDINGS: Diffuse osteopenia. Degenerative changes lumbar spine and left hip. Total right hip replacement. No acute bony abnormality. No evidence of fracture dislocation. Stable sclerotic density left buttock regions suggesting injection granuloma. Pelvic calcifications consistent with phleboliths. Aortoiliac and peripheral atherosclerotic vascular calcification. Bilateral iliac stents. IMPRESSION: 1. Diffuse osteopenia. Degenerative changes lumbar spine left hip. Total right hip replacement. No acute bony abnormality. 2. Aortoiliac and peripheral vascular disease. Bilateral iliac stents. Electronically Signed   By: Maisie Fus  Register   On: 08/14/2019 12:14     EKG: Independently reviewed.  Sinus rhythm, QTC 497, LAD, nonspecific T wave change   Assessment/Plan Principal Problem:   Fall Active Problems:   HTN (hypertension)   HLD (hyperlipidemia)   COPD (chronic obstructive pulmonary disease) (HCC)   GERD (gastroesophageal reflux disease)   Hyponatremia   Stroke (HCC)   COVID-19 virus infection   CKD (chronic kidney disease), stage IIIa   Elevated troponin   Fall: Patient complains of pelvic pain.  X-ray of her pelvis is negative -will place on med-surg bed for bos -will get CT-head  -pt/OT  COVID-19 virus infection: Patient does not have oxygen desaturation.  Chest x-ray check mild right basilar  infiltration. -We will hold remdesivir and steroid -give Vitamin C and zinc -check inflammatory marker -Code dilators  Elevated trop: Troponin I 65.  No chest pain possibly due to demand ischemia -Check A1c, FLP -Trend troponin -Patient is on Plavix -repeat EKG in AM  HTN (hypertension): Blood pressure is elevated at 180/70 -hold lasix and Cozaar due to hyponatremia -Start amlodipine 10 mg daily -Continue hydralazine -As needed hydralazine  HLD (hyperlipidemia): -lipitor  COPD (chronic obstructive pulmonary disease) (HCC): -Bronchodilators  GERD (gastroesophageal reflux disease): -PPI  Hyponatremia:  - Will check urine sodium, urine osmolality, serum osmolality. - check TSH - IVF: NS at 75 mL/h - f/u by BMP q6h -hold lasix and cozarr  Stroke [facility]): -on plavix and lipitor  CKD (chronic kidney disease), stage IIIa: stable. Cre 1.17 -f/u by BMP    DVT ppx: SQ Lovenox Code Status: Full code per her daughter Family Communication: I called her daughter  Disposition Plan:  Anticipate discharge back to previous home environment Consults called:  none Admission status: Med-surg bed for obs  Date of Service 08/14/2019    Lorretta Harp [facility]   If 7PM-7AM, please contact night-coverage [email or web address] Password [facility] 08/14/2019, 5:54 PM

## 2019-08-14 NOTE — ED Notes (Signed)
ED TO INPATIENT HANDOFF REPORT  ED Nurse Name and Phone #:  Toma Copier [phone]  S Name/Age/Gender Donna Quinn 84 y.o. female Room/Bed: ED30A/ED30A  Code Status   Code Status: Full Code  Home/SNF/Other Home Patient oriented to: self, place, time and situation Is this baseline? Yes   Triage Complete: Triage complete  Chief Complaint Fall [W19.XXXA]  Triage Note Pt in vis EMS from home with c/o fall this am with pain to left pelvic area. EMS reports pt has been getting weaker and weaker over the last week.  FSBS 144, unable to get temp, BP 192/86. EMS reports no shortening or obvious deformity noted.   Pt here via EMS from home this am after a fall, states she "just got weak" now with c/o pain to pelvic area from fall, falling off to sleep during triage, states she lives alone, difficult to obtain triage information. NAD.     Allergies Allergies  Allergen Reactions  . Aggrenox [Aspirin-Dipyridamole Er] Other (See Comments)    Reaction: A really bad headache  . Augmentin [Amoxicillin-Pot Clavulanate] Other (See Comments)    Reaction: unknown  . Penicillins Other (See Comments)    Has patient had a PCN reaction causing immediate rash, facial/tongue/throat swelling, SOB or lightheadedness with hypotension: Yes Has patient had a PCN reaction causing severe rash involving mucus membranes or skin necrosis: Yes Has patient had a PCN reaction that required hospitalization No Has patient had a PCN reaction occurring within the last 10 years: No If all of the above answers are "NO", then may proceed with Cephalosporin use.  . Remeron [Mirtazapine] Other (See Comments)    Broke out inside the mouth  . Sulfa Antibiotics Hives and Other (See Comments)    Broke out in welps and had to be hospitalized    Level of Care/Admitting Diagnosis ED Disposition    ED Disposition Condition Comment   Admit  Hospital Area: [facility] [[id number]]  Level of Care: Med-Surg [16]   Covid Evaluation: Confirmed COVID Positive  Diagnosis: Fall [290176]  Admitting Physician: Lorretta Harp [[id number]]  Attending Physician: Lorretta Harp [[id number]]  PT Class (Do Not Modify): Observation [104]  PT Acc Code (Do Not Modify): Observation [10022]       B Medical/Surgery History Past Medical History:  Diagnosis Date  . AAA (abdominal aortic aneurysm) (HCC)   . Brain aneurysm   . Collagen vascular disease (HCC)   . COPD (chronic obstructive pulmonary disease) (HCC)   . Dyspnea   . GERD (gastroesophageal reflux disease)   . H/O wheezing   . Hypercholesterolemia   . Hypertension   . Neuropathy   . Osteoporosis   . Stroke [facility]) 2016   twice   Past Surgical History:  Procedure Laterality Date  . abdominal Bilateral    stents  . BACK SURGERY     cyst removed from back  . BRAIN SURGERY     Brain aneurysm repair, coiling  . CAROTID ENDARTERECTOMY    . CATARACT EXTRACTION W/PHACO Left 07/08/2016   Procedure: CATARACT EXTRACTION PHACO AND INTRAOCULAR LENS PLACEMENT (IOC);  Surgeon: Nevada Crane, MD;  Location: [facility] ORS;  Service: Ophthalmology;  Laterality: Left;  [place]  01:30 AP% 39.1 CDE 20.3 Fluid pack lot # [id number]  . CATARACT EXTRACTION W/PHACO Right 09/16/2016   Procedure: CATARACT EXTRACTION PHACO AND INTRAOCULAR LENS PLACEMENT (IOC);  Surgeon: Nevada Crane, MD;  Location: [facility] ORS;  Service: Ophthalmology;  Laterality: Right;  Lot # [id number] H [place]: 01"21.7 AP% 16.8:  CDE: 13.90  . HIP ARTHROPLASTY Right 10/20/2016   Procedure: ARTHROPLASTY BIPOLAR HIP (HEMIARTHROPLASTY);  Surgeon: Christena FlakeJohn J Poggi, MD;  Location: [facility] ORS;  Service: Orthopedics;  Laterality: Right;     A IV Location/Drains/Wounds Patient Lines/Drains/Airways Status   Active Line/Drains/Airways    Name:   Placement date:   Placement time:   Site:   Days:   Peripheral IV 08/14/19 Left Antecubital   08/14/19    0943    Antecubital   less than 1          Intake/Output Last 24 hours No intake or  output data in the 24 hours ending 08/14/19 2103  Labs/Imaging Results for orders placed or performed during the hospital encounter of 08/14/19 (from the past 48 hour(s))  Basic metabolic panel     Status: Abnormal   Collection Time: 08/14/19  9:40 AM  Result Value Ref Range   Sodium 128 (L) 135 - 145 mmol/L   Potassium 4.5 3.5 - 5.1 mmol/L   Chloride 90 (L) 98 - 111 mmol/L   CO2 27 22 - 32 mmol/L   Glucose, Bld 120 (H) 70 - 99 mg/dL   BUN 24 (H) 8 - 23 mg/dL   Creatinine, Ser 9.921.17 (H) 0.44 - 1.00 mg/dL   Calcium 8.7 (L) 8.9 - 10.3 mg/dL   GFR calc non Af Amer 44 (L) >60 mL/min   GFR calc Af Amer 51 (L) >60 mL/min   Anion gap 11 5 - 15    Comment: Performed at [facility], [address] [place], [place] [address]  CBC     Status: Abnormal   Collection Time: 08/14/19  9:40 AM  Result Value Ref Range   WBC 4.9 4.0 - 10.5 K/uL   RBC 3.88 3.87 - 5.11 MIL/uL   Hemoglobin 10.5 (L) 12.0 - 15.0 g/dL   HCT 41.931.9 (L) 62.236.0 - 29.746.0 %   MCV 82.2 80.0 - 100.0 fL   MCH 27.1 26.0 - 34.0 pg   MCHC 32.9 30.0 - 36.0 g/dL   RDW 98.914.2 21.111.5 - 94.115.5 %   Platelets 246 150 - 400 K/uL   nRBC 0.0 0.0 - 0.2 %    Comment: Performed at [facility], [address] [place], [place] [address]  Lactic acid, plasma     Status: None   Collection Time: 08/14/19  9:40 AM  Result Value Ref Range   Lactic Acid, Venous 1.0 0.5 - 1.9 mmol/L    Comment: Performed at [facility], [address] [place], [place] [address]  Ferritin (Iron Binding Protein)     Status: Abnormal   Collection Time: 08/14/19  9:40 AM  Result Value Ref Range   Ferritin 555 (H) 11 - 307 ng/mL    Comment: Performed at [facility], [address] [place], [place] [address]  Fibrinogen     Status: Abnormal   Collection Time: 08/14/19  9:40 AM  Result Value Ref Range   Fibrinogen 606 (H) 210 - 475 mg/dL    Comment: Performed at [facility], [address] [place], [place] [address]   D-dimer, quantitative (not at [facility])     Status: Abnormal   Collection Time: 08/14/19  9:40 AM  Result Value Ref Range   D-Dimer, Quant 2.26 (H) 0.00 - 0.50 ug/mL-FEU    Comment: (NOTE) At the manufacturer cut-off of 0.50 ug/mL FEU, this assay has been documented to exclude PE with a sensitivity and negative predictive value of 97  to 99%.  At this time, this assay has not been approved by the FDA to exclude DVT/VTE. Results should be correlated with clinical presentation. Performed at [facility], [place] [address] [place], [place] [address]   C-reactive protein     Status: Abnormal   Collection Time: 08/14/19  9:41 AM  Result Value Ref Range   CRP 11.1 (H) <1.0 mg/dL    Comment: Performed at [place] [address], [place], [place] [address]  Urinalysis, Complete w Microscopic     Status: Abnormal   Collection Time: 08/14/19  9:50 AM  Result Value Ref Range   Color, Urine YELLOW (A) YELLOW   APPearance CLEAR (A) CLEAR   Specific Gravity, Urine 1.013 1.005 - 1.030   pH 6.0 5.0 - 8.0   Glucose, UA NEGATIVE NEGATIVE mg/dL   Hgb urine dipstick NEGATIVE NEGATIVE   Bilirubin Urine NEGATIVE NEGATIVE   Ketones, ur NEGATIVE NEGATIVE mg/dL   Protein, ur 30 (A) NEGATIVE mg/dL   Nitrite NEGATIVE NEGATIVE   Leukocytes,Ua NEGATIVE NEGATIVE   RBC / HPF 0-5 0 - 5 RBC/hpf   WBC, UA 0-5 0 - 5 WBC/hpf   Bacteria, UA NONE SEEN NONE SEEN   Squamous Epithelial / LPF 0-5 0 - 5    Comment: Performed at [facility], [address] [place], [place] [address]  Lactic acid, plasma     Status: None   Collection Time: 08/14/19 11:51 AM  Result Value Ref Range   Lactic Acid, Venous 1.1 0.5 - 1.9 mmol/L    Comment: Performed at [facility], [address] [place], [place] [address]  POC SARS Coronavirus 2 Ag     Status: Abnormal   Collection Time: 08/14/19 12:20 PM  Result Value Ref Range   SARS Coronavirus 2 Ag POSITIVE (A) NEGATIVE    Comment:  (NOTE) SARS-CoV-2 antigen PRESENT. Positive results indicate the presence of viral antigens, but clinical correlation with patient history and other diagnostic information is necessary to determine patient infection status.  Positive results do not rule out bacterial infection or co-infection  with other viruses. False positive results are rare but can occur, and confirmatory RT-PCR testing may be appropriate in some circumstances. The expected result is Negative. Fact Sheet for Patients: PodPark.tn Fact Sheet for Providers: GiftContent.is  This test is not yet approved or cleared by the [place] FDA and  has been authorized for detection and/or diagnosis of SARS-CoV-2 by FDA under an Emergency Use Authorization (EUA).  This EUA will remain in effect (meaning this test can be used) for the duration of  the COVID-19 declaration under Section 564(b)(1) of the Act, 21 U.S.C. section 360bbb-3(b)(1), unless the a uthorization is terminated or revoked sooner.   Osmolality-plasma     Status: Abnormal   Collection Time: 08/14/19  3:18 PM  Result Value Ref Range   Osmolality 271 (L) 275 - 295 mOsm/kg    Comment: Performed at [facility], [place] [address], [place], [place] [address]  Osmolality, urine     Status: None   Collection Time: 08/14/19  3:18 PM  Result Value Ref Range   Osmolality, Ur 372 300 - 900 mOsm/kg    Comment: Performed at [facility] [address], [place], [place] [address]  Sodium, urine, random     Status: None   Collection Time: 08/14/19  3:18 PM  Result Value Ref Range   Sodium, Ur 77 mmol/L  Comment: Performed at [facility], [address] [place], [place] [address]  BNP     Status: Abnormal   Collection Time: 08/14/19  3:18 PM  Result Value Ref Range   B Natriuretic Peptide 577.0 (H) 0.0 - 100.0 pg/mL    Comment: Performed at [facility], [address]  [place], [place] [address]  Hepatitis B surface antigen     Status: None   Collection Time: 08/14/19  3:18 PM  Result Value Ref Range   Hepatitis B Surface Ag NON REACTIVE NON REACTIVE    Comment: Performed at [facility], [address], [place], [place] [address]  Lactate dehydrogenase     Status: None   Collection Time: 08/14/19  3:18 PM  Result Value Ref Range   LDH 155 98 - 192 U/L    Comment: Performed at [facility], [address] [place], [place] [address]  Procalcitonin     Status: None   Collection Time: 08/14/19  3:18 PM  Result Value Ref Range   Procalcitonin 0.11 ng/mL    Comment:        Interpretation: PCT (Procalcitonin) <= 0.5 ng/mL: Systemic infection (sepsis) is not likely. Local bacterial infection is possible. (NOTE)       Sepsis PCT Algorithm           Lower Respiratory Tract                                      Infection PCT Algorithm    ----------------------------     ----------------------------         PCT < 0.25 ng/mL                PCT < 0.10 ng/mL         Strongly encourage             Strongly discourage   discontinuation of antibiotics    initiation of antibiotics    ----------------------------     -----------------------------       PCT 0.25 - 0.50 ng/mL            PCT 0.10 - 0.25 ng/mL               OR       >80% decrease in PCT            Discourage initiation of                                            antibiotics      Encourage discontinuation           of antibiotics    ----------------------------     -----------------------------         PCT >= 0.50 ng/mL              PCT 0.26 - 0.50 ng/mL               AND        <80% decrease in PCT             Encourage initiation of  antibiotics       Encourage continuation           of antibiotics    ----------------------------     -----------------------------        PCT >= 0.50 ng/mL                  PCT > 0.50 ng/mL                AND         increase in PCT                  Strongly encourage                                      initiation of antibiotics    Strongly encourage escalation           of antibiotics                                     -----------------------------                                           PCT <= 0.25 ng/mL                                                 OR                                        > 80% decrease in PCT                                     Discontinue / Do not initiate                                             antibiotics Performed at [facility], [address] [place], [place] [address]   Troponin I (High Sensitivity)     Status: Abnormal   Collection Time: 08/14/19  3:18 PM  Result Value Ref Range   Troponin I (High Sensitivity) 165 (HH) <18 ng/L    Comment: CRITICAL RESULT CALLED TO, READ BACK BY AND VERIFIED WITH DEE MCCLAIN 08/14/19 @ 1602  MLK (NOTE) Elevated high sensitivity troponin I (hsTnI) values and significant  changes across serial measurements may suggest ACS but many other  chronic and acute conditions are known to elevate hsTnI results.  Refer to the "Links" section for chest pain algorithms and additional  guidance. Performed at [facility], [address] [place], [place] [address]   Triglycerides     Status: None   Collection Time: 08/14/19  3:18 PM  Result Value Ref Range   Triglycerides 46 <150 mg/dL    Comment: Performed at [facility], [address], [place], [place] [address]  D-dimer,  quantitative (not at [facility])     Status: Abnormal   Collection Time: 08/14/19  3:18 PM  Result Value Ref Range   D-Dimer, Quant 1.87 (H) 0.00 - 0.50 ug/mL-FEU    Comment: (NOTE) At the manufacturer cut-off of 0.50 ug/mL FEU, this assay has been documented to exclude PE with a sensitivity and negative predictive value of 97 to 99%.  At this time, this assay has not been approved by the FDA to exclude DVT/VTE. Results  should be correlated with clinical presentation. Performed at [facility], [address] [place], [place] [address]   Basic metabolic panel     Status: Abnormal   Collection Time: 08/14/19  8:03 PM  Result Value Ref Range   Sodium 129 (L) 135 - 145 mmol/L   Potassium 4.3 3.5 - 5.1 mmol/L   Chloride 94 (L) 98 - 111 mmol/L   CO2 27 22 - 32 mmol/L   Glucose, Bld 86 70 - 99 mg/dL   BUN 25 (H) 8 - 23 mg/dL   Creatinine, Ser 8.84 (H) 0.44 - 1.00 mg/dL   Calcium 8.0 (L) 8.9 - 10.3 mg/dL   GFR calc non Af Amer 47 (L) >60 mL/min   GFR calc Af Amer 55 (L) >60 mL/min   Anion gap 8 5 - 15    Comment: Performed at [facility], [address], [place], [place] [address]   DG Chest 2 View  Result Date: 08/14/2019 CLINICAL DATA:  Shortness of breath. EXAM: CHEST - 2 VIEW COMPARISON:  08/05/2018.  CT 03/21/2018. FINDINGS: Mediastinum and hilar structures normal. Right base infiltrate consistent with pneumonia. Small right pleural effusion. No pneumothorax. Biapical pleural thickening consistent scarring. Cardiomegaly with normal pulmonary vascularity. Aortic and peripheral vascular calcification. No acute bony abnormality identified. IMPRESSION: 1. Right base infiltrate consistent pneumonia. Small right pleural effusion. 2.  Cardiomegaly.  No pulmonary venous congestion. 3.  Sliding hiatal hernia again noted. Electronically Signed   By: Maisie Fus  Register   On: 08/14/2019 10:31   DG Pelvis 1-2 Views  Result Date: 08/14/2019 CLINICAL DATA:  Fall. EXAM: PELVIS - 1-2 VIEW COMPARISON:  05/02/2018. FINDINGS: Diffuse osteopenia. Degenerative changes lumbar spine and left hip. Total right hip replacement. No acute bony abnormality. No evidence of fracture dislocation. Stable sclerotic density left buttock regions suggesting injection granuloma. Pelvic calcifications consistent with phleboliths. Aortoiliac and peripheral atherosclerotic vascular calcification. Bilateral iliac stents. IMPRESSION: 1.  Diffuse osteopenia. Degenerative changes lumbar spine left hip. Total right hip replacement. No acute bony abnormality. 2. Aortoiliac and peripheral vascular disease. Bilateral iliac stents. Electronically Signed   By: Maisie Fus  Register   On: 08/14/2019 12:14   CT HEAD WO CONTRAST  Result Date: 08/14/2019 CLINICAL DATA:  Headache with head trauma. Pain to the back of the head. EXAM: CT HEAD WITHOUT CONTRAST TECHNIQUE: Contiguous axial images were obtained from the base of the skull through the vertex without intravenous contrast. COMPARISON:  None. FINDINGS: Brain: No evidence of acute infarction, hemorrhage, hydrocephalus, extra-axial collection or mass lesion/mass effect. Atrophy and chronic microvascular ischemic changes are noted. There is encephalomalacia involving the left occipital lobe as well as the left frontal lobe and left cerebellum. The study was degraded by motion artifact. Vascular: Again noted are metallic coils within the left suprasellar region is seen on multiple prior studies. Skull: Normal. Negative for fracture or focal lesion. Sinuses/Orbits: No acute finding. Other: None. IMPRESSION: 1. Motion degraded study. 2. No acute intracranial abnormality. 3. Chronic findings as detailed above.  Electronically Signed   By: Katherine Mantlehristopher  Green M.D.   On: 08/14/2019 19:04    Pending Labs Unresulted Labs (From admission, onward)    Start     Ordered   08/15/19 0500  TSH AM  Tomorrow morning,   STAT     08/14/19 1422   08/15/19 0500  CBC with Differential/Platelet  Daily,   STAT     08/14/19 1422   08/15/19 0500  Comprehensive metabolic panel  Daily,   STAT     08/14/19 1422   08/15/19 0500  C-reactive protein  Daily,   STAT     08/14/19 1422   08/15/19 0500  Fibrin derivatives D-Dimer ([facility] only)  Daily,   STAT     08/14/19 1422   08/15/19 0500  Ferritin  Daily,   STAT     08/14/19 1422   08/15/19 0500  Magnesium  Daily,   STAT     08/14/19 1422   08/14/19 1700  Basic metabolic panel   Now then every 8 hours,   STAT     08/14/19 1422   Signed and Held  AM - HgA1c  Tomorrow morning,   R     Signed and Held   Signed and Held  AM - FLP  Tomorrow morning,   R     Signed and Held          Vitals/Pain Today's Vitals   08/14/19 1830 08/14/19 1845 08/14/19 1900 08/14/19 1930  BP: (!) 162/93  (!) 159/70 (!) 145/84  Pulse: 69 75 69 69  Resp: (!) 21 (!) 23 18 (!) 27  Temp:      TempSrc:      SpO2: 96% 96% 97% 96%  Weight:      Height:      PainSc:        Isolation Precautions Airborne and Contact precautions  Medications Medications  hydrALAZINE (APRESOLINE) tablet 25 mg (has no administration in time range)  oxyCODONE-acetaminophen (PERCOCET/ROXICET) 5-325 MG per tablet 1 tablet (1 tablet Oral Given 08/14/19 1354)  acetaminophen (TYLENOL) tablet 650 mg (has no administration in time range)  0.9 %  sodium chloride infusion ( Intravenous New Bag/Given 08/14/19 1356)  ondansetron (ZOFRAN) injection 4 mg (has no administration in time range)  ascorbic acid (VITAMIN C) tablet 500 mg (500 mg Oral Given 08/14/19 1517)  zinc sulfate capsule 220 mg (220 mg Oral Given 08/14/19 1517)  enoxaparin (LOVENOX) injection 40 mg (40 mg Subcutaneous Given 08/14/19 2002)  ipratropium (ATROVENT HFA) inhaler 2 puff (2 puffs Inhalation Given 08/14/19 2003)  albuterol (VENTOLIN HFA) 108 (90 Base) MCG/ACT inhaler 2 puff (has no administration in time range)  dextromethorphan-guaiFENesin (MUCINEX DM) 30-600 MG per 12 hr tablet 1 tablet ( Oral Canceled Entry 08/14/19 1457)  amLODipine (NORVASC) tablet 10 mg (10 mg Oral Given 08/14/19 1900)  sodium chloride flush (NS) 0.9 % injection 3 mL (3 mLs Intravenous Given 08/14/19 1257)  acetaminophen (TYLENOL) tablet 1,000 mg (1,000 mg Oral Given 08/14/19 1257)    Mobility walks with device High fall risk   Focused Assessments n/a   R Recommendations: See Admitting Provider Note  Report given to:   Additional Notes: n/a

## 2019-08-14 NOTE — ED Notes (Signed)
Report provided to [place], [place]

## 2019-08-14 NOTE — ED Notes (Signed)
Temp taken x3 without success. Orally twice and axillary once.

## 2019-08-14 NOTE — ED Triage Notes (Signed)
Pt in vis EMS from home with c/o fall this am with pain to left pelvic area. EMS reports pt has been getting weaker and weaker over the last week.  FSBS 144, unable to get temp, BP 192/86. EMS reports no shortening or obvious deformity noted.

## 2019-08-14 NOTE — ED Provider Notes (Signed)
[facility] Emergency Department Provider Note  Time seen: 12:00 PM  I have reviewed the triage vital signs and the nursing notes.   HISTORY  Chief Complaint Fall, Weakness, and Shortness of Breath   HPI Donna Quinn is a 84 y.o. female with a past medical history of COPD, gastric reflux, hypertension, hyperlipidemia, CVA, presents to the emergency department for generalized weakness.  According to the patient for the past 2 days she has been feeling somewhat weak, states she had a fall at home today.  Denies any pain to myself but did state to the triage nurse of pain in her pelvis.  Specifically denies any pain currently.  Denies hitting her head.  Denies LOC.  States she has been feeling somewhat short of breath over the past few days.  Denies any known fever.   Past Medical History:  Diagnosis Date  . AAA (abdominal aortic aneurysm) (HCC)   . Brain aneurysm   . Collagen vascular disease (HCC)   . COPD (chronic obstructive pulmonary disease) (HCC)   . Dyspnea   . GERD (gastroesophageal reflux disease)   . H/O wheezing   . Hypercholesterolemia   . Hypertension   . Neuropathy   . Osteoporosis   . Stroke [facility]) 2016   twice    Patient Active Problem List   Diagnosis Date Noted  . Hypokalemia 08/05/2018  . COPD with exacerbation (HCC) 08/05/2018  . Hyponatremia 05/03/2018  . Dehydration 05/02/2018  . Fracture of femoral neck, right (HCC) 10/19/2016  . HTN (hypertension) 10/19/2016  . HLD (hyperlipidemia) 10/19/2016  . COPD (chronic obstructive pulmonary disease) (HCC) 10/19/2016  . GERD (gastroesophageal reflux disease) 10/19/2016  . COPD exacerbation (HCC) 08/06/2016    Past Surgical History:  Procedure Laterality Date  . abdominal Bilateral    stents  . BACK SURGERY     cyst removed from back  . BRAIN SURGERY     Brain aneurysm repair, coiling  . CAROTID ENDARTERECTOMY    . CATARACT EXTRACTION W/PHACO Left 07/08/2016   Procedure:  CATARACT EXTRACTION PHACO AND INTRAOCULAR LENS PLACEMENT (IOC);  Surgeon: Nevada Crane, MD;  Location: [facility] ORS;  Service: Ophthalmology;  Laterality: Left;  [place]  01:30 AP% 39.1 CDE 20.3 Fluid pack lot # [id number]  . CATARACT EXTRACTION W/PHACO Right 09/16/2016   Procedure: CATARACT EXTRACTION PHACO AND INTRAOCULAR LENS PLACEMENT (IOC);  Surgeon: Nevada Crane, MD;  Location: [facility] ORS;  Service: Ophthalmology;  Laterality: Right;  Lot # [id number] H [place]: 01"21.7 AP% 16.8: CDE: 13.90  . HIP ARTHROPLASTY Right 10/20/2016   Procedure: ARTHROPLASTY BIPOLAR HIP (HEMIARTHROPLASTY);  Surgeon: Christena Flake, MD;  Location: [facility] ORS;  Service: Orthopedics;  Laterality: Right;    Prior to Admission medications   Medication Sig Start Date End Date Taking? Authorizing Provider  acetaminophen (TYLENOL) 500 MG tablet Take 1 tablet (500 mg total) by mouth every 6 (six) hours as needed for moderate pain or headache. 10/22/16   Gouru, Deanna Artis, MD  atorvastatin (LIPITOR) 40 MG tablet Take 40 mg by mouth every evening.     [provider]  clopidogrel (PLAVIX) 75 MG tablet Take 75 mg by mouth every morning.    [provider]  EASY-LAX PLUS 8.6-50 MG tablet Take 2 tablets by mouth daily as needed for mild constipation.  03/31/18   [provider]  furosemide (LASIX) 20 MG tablet Take 20 mg by mouth daily as needed. 06/06/18 06/06/19  [provider]  gabapentin (NEURONTIN) 300 MG capsule  Take 300 mg by mouth 2 (two) times daily.    [provider]  hydrALAZINE (APRESOLINE) 25 MG tablet Take 50 mg by mouth 3 (three) times daily.     [provider]  ipratropium-albuterol (DUONEB) 0.5-2.5 (3) MG/3ML SOLN Inhale 3 mLs into the lungs 4 (four) times daily. 03/31/18   [provider]  LORazepam (ATIVAN) 1 MG tablet Take 1 mg by mouth at bedtime as needed for anxiety or sleep. 05/01/18   [provider]  losartan (COZAAR) 50 MG tablet Take 50 mg by mouth  2 (two) times daily.     [provider]  omeprazole (PRILOSEC) 20 MG capsule Take 20 mg by mouth 2 (two) times daily before a meal.    [provider]  predniSONE (DELTASONE) 10 MG tablet Label  & dispense according to the schedule below. 5 Pills PO for 1 day then, 4 Pills PO for 1 day, 3 Pills PO for 1 day, 2 Pills PO for 1 day, 1 Pill PO for 1 days then STOP. 08/07/18   Henreitta Leber, MD  vitamin B-12 (CYANOCOBALAMIN) 1000 MCG tablet Take 1,000 mcg by mouth daily.    [provider]  Vitamin D, Ergocalciferol, (DRISDOL) 50000 units CAPS capsule Take 1 capsule by mouth once a week. Takes on Thursday 02/28/18   [provider]    Allergies  Allergen Reactions  . Aggrenox [Aspirin-Dipyridamole Er] Other (See Comments)    Reaction: A really bad headache  . Augmentin [Amoxicillin-Pot Clavulanate] Other (See Comments)    Reaction: unknown  . Penicillins Other (See Comments)    Has patient had a PCN reaction causing immediate rash, facial/tongue/throat swelling, SOB or lightheadedness with hypotension: Yes Has patient had a PCN reaction causing severe rash involving mucus membranes or skin necrosis: Yes Has patient had a PCN reaction that required hospitalization No Has patient had a PCN reaction occurring within the last 10 years: No If all of the above answers are "NO", then may proceed with Cephalosporin use.  . Remeron [Mirtazapine] Other (See Comments)    Broke out inside the mouth  . Sulfa Antibiotics Hives and Other (See Comments)    Broke out in welps and had to be hospitalized    Family History  Problem Relation Age of Onset  . Breast cancer Sister 59  . Kidney disease Father     Social History Social History   Tobacco Use  . Smoking status: Former Smoker    Packs/day: 1.00    Years: 40.00    Pack years: 40.00    Types: Cigarettes  . Smokeless tobacco: Never Used  Substance Use Topics  . Alcohol use: No  . Drug use: No    Review  of Systems Constitutional: Negative for fever. Cardiovascular: Negative for chest pain. Respiratory: Negative for shortness of breath. Gastrointestinal: Negative for abdominal pain, vomiting  Genitourinary: Negative for urinary compaints Musculoskeletal: Negative for musculoskeletal complaints Skin: Negative for skin complaints  Neurological: Negative for headache All other ROS negative  ____________________________________________   PHYSICAL EXAM:  VITAL SIGNS: ED Triage Vitals  Enc Vitals Group     BP 08/14/19 0929 (!) 178/73     Pulse Rate 08/14/19 0929 71     Resp 08/14/19 0929 16     Temp --      Temp src --      SpO2 08/14/19 0929 97 %     Weight 08/14/19 0930 115 lb (52.2 kg)     Height 08/14/19  0930 5\' 5"  (1.651 m)     Head Circumference --      Peak Flow --      Pain Score 08/14/19 0930 8     Pain Loc --      Pain Edu? --      Excl. in GC? --    Constitutional: Alert and oriented. Well appearing and in no distress. Eyes: Normal exam ENT      Head: Normocephalic and atraumatic.      Mouth/Throat: Mucous membranes are moist. Cardiovascular: Normal rate, regular rhythm. Respiratory: Normal respiratory effort without tachypnea nor retractions. Breath sounds are clear  Gastrointestinal: Soft and nontender. No distention.  Musculoskeletal: Nontender with normal range of motion in all extremities.  Neurologic:  Normal speech and language. No gross focal neurologic deficits  Skin:  Skin is warm, dry and intact.  Psychiatric: Mood and affect are normal.   ____________________________________________    EKG  EKG viewed and interpreted by myself shows a normal sinus rhythm at 71 bpm with a slightly widened QRS, normal axis, largely normal intervals nonspecific but no concerning ST changes.  ____________________________________________    RADIOLOGY  X-ray consistent right basilar infiltrate. Pelvis x-ray consistent with diffuse  osteopenia.  ____________________________________________   INITIAL IMPRESSION / ASSESSMENT AND PLAN / ED COURSE  Pertinent labs & imaging results that were available during my care of the patient were reviewed by me and considered in my medical decision making (see chart for details).   Patient presents to the emergency department for shortness of breath and generalized fatigue/weakness.  States she has been coughing and feeling somewhat short of breath.  Denies any known fever.  Denies any nausea vomiting diarrhea chest pain or abdominal pain.  Denies any headache or focal weakness or numbness.  Overall the patient appears well, no acute distress.  Patient's x-ray shows a right basilar infiltrate.  Lab work is largely nonrevealing.  Patient does have borderline hyponatremia however this appears to be largely chronic for the patient as well.  We will IV hydrate, check urine sample and continue to closely monitor.  Given the patient's shortness of breath we will also check a Covid test.  Patient's Covid test is positive.  Given her generalized weakness hyponatremia fall at home and she lives alone patient would not be safe for discharge home at this time.  We will admit to the hospitalist service for further treatment.  Patient is maintaining sats in the mid upper 90s on room air.  Given her Covid positive status highly suspect lung infiltrate due to Covid not bacterial pneumonia given no white blood cell count elevation and a normal lactate.  Donna Quinn was evaluated in Emergency Department on 08/14/2019 for the symptoms described in the history of present illness. She was evaluated in the context of the global COVID-19 pandemic, which necessitated consideration that the patient might be at risk for infection with the SARS-CoV-2 virus that causes COVID-19. Institutional protocols and algorithms that pertain to the evaluation of patients at risk for COVID-19 are in a state of rapid change based on  information released by regulatory bodies including the CDC and federal and state organizations. These policies and algorithms were followed during the patient's care in the ED.  ____________________________________________   FINAL CLINICAL IMPRESSION(S) / ED DIAGNOSES  COVID-19 Dyspnea Generalized weakness   10/12/2019, MD 08/14/19 1248

## 2019-08-14 NOTE — ED Triage Notes (Signed)
Pt here via EMS from home this am after a fall, states she "just got weak" now with c/o pain to pelvic area from fall, falling off to sleep during triage, states she lives alone, difficult to obtain triage information. NAD.

## 2019-08-15 DIAGNOSIS — Z96641 Presence of right artificial hip joint: Secondary | ICD-10-CM | POA: Diagnosis present

## 2019-08-15 DIAGNOSIS — N183 Chronic kidney disease, stage 3 unspecified: Secondary | ICD-10-CM | POA: Diagnosis present

## 2019-08-15 DIAGNOSIS — Z7902 Long term (current) use of antithrombotics/antiplatelets: Secondary | ICD-10-CM | POA: Diagnosis not present

## 2019-08-15 DIAGNOSIS — E7800 Pure hypercholesterolemia, unspecified: Secondary | ICD-10-CM | POA: Diagnosis present

## 2019-08-15 DIAGNOSIS — N1831 Chronic kidney disease, stage 3a: Secondary | ICD-10-CM | POA: Diagnosis not present

## 2019-08-15 DIAGNOSIS — R5381 Other malaise: Secondary | ICD-10-CM | POA: Diagnosis present

## 2019-08-15 DIAGNOSIS — Z79899 Other long term (current) drug therapy: Secondary | ICD-10-CM | POA: Diagnosis not present

## 2019-08-15 DIAGNOSIS — J1282 Pneumonia due to coronavirus disease 2019: Secondary | ICD-10-CM | POA: Diagnosis present

## 2019-08-15 DIAGNOSIS — U071 COVID-19: Secondary | ICD-10-CM | POA: Diagnosis present

## 2019-08-15 DIAGNOSIS — Z8673 Personal history of transient ischemic attack (TIA), and cerebral infarction without residual deficits: Secondary | ICD-10-CM | POA: Diagnosis not present

## 2019-08-15 DIAGNOSIS — I129 Hypertensive chronic kidney disease with stage 1 through stage 4 chronic kidney disease, or unspecified chronic kidney disease: Secondary | ICD-10-CM | POA: Diagnosis present

## 2019-08-15 DIAGNOSIS — Z87891 Personal history of nicotine dependence: Secondary | ICD-10-CM | POA: Diagnosis not present

## 2019-08-15 DIAGNOSIS — E785 Hyperlipidemia, unspecified: Secondary | ICD-10-CM | POA: Diagnosis present

## 2019-08-15 DIAGNOSIS — I248 Other forms of acute ischemic heart disease: Secondary | ICD-10-CM | POA: Diagnosis present

## 2019-08-15 DIAGNOSIS — W19XXXA Unspecified fall, initial encounter: Secondary | ICD-10-CM | POA: Diagnosis not present

## 2019-08-15 DIAGNOSIS — R339 Retention of urine, unspecified: Secondary | ICD-10-CM | POA: Diagnosis not present

## 2019-08-15 DIAGNOSIS — R531 Weakness: Secondary | ICD-10-CM | POA: Diagnosis present

## 2019-08-15 DIAGNOSIS — K219 Gastro-esophageal reflux disease without esophagitis: Secondary | ICD-10-CM | POA: Diagnosis present

## 2019-08-15 DIAGNOSIS — J440 Chronic obstructive pulmonary disease with acute lower respiratory infection: Secondary | ICD-10-CM | POA: Diagnosis present

## 2019-08-15 DIAGNOSIS — M810 Age-related osteoporosis without current pathological fracture: Secondary | ICD-10-CM | POA: Diagnosis present

## 2019-08-15 DIAGNOSIS — E871 Hypo-osmolality and hyponatremia: Secondary | ICD-10-CM | POA: Diagnosis present

## 2019-08-15 LAB — CBC WITH DIFFERENTIAL/PLATELET
Abs Immature Granulocytes: 0.01 10*3/uL (ref 0.00–0.07)
Basophils Absolute: 0 10*3/uL (ref 0.0–0.1)
Basophils Relative: 0 %
Eosinophils Absolute: 0.1 10*3/uL (ref 0.0–0.5)
Eosinophils Relative: 1 %
HCT: 26.8 % — ABNORMAL LOW (ref 36.0–46.0)
Hemoglobin: 8.9 g/dL — ABNORMAL LOW (ref 12.0–15.0)
Immature Granulocytes: 0 %
Lymphocytes Relative: 13 %
Lymphs Abs: 0.5 10*3/uL — ABNORMAL LOW (ref 0.7–4.0)
MCH: 27.2 pg (ref 26.0–34.0)
MCHC: 33.2 g/dL (ref 30.0–36.0)
MCV: 82 fL (ref 80.0–100.0)
Monocytes Absolute: 0.4 10*3/uL (ref 0.1–1.0)
Monocytes Relative: 10 %
Neutro Abs: 3.1 10*3/uL (ref 1.7–7.7)
Neutrophils Relative %: 76 %
Platelets: 217 10*3/uL (ref 150–400)
RBC: 3.27 MIL/uL — ABNORMAL LOW (ref 3.87–5.11)
RDW: 14.2 % (ref 11.5–15.5)
WBC: 4.1 10*3/uL (ref 4.0–10.5)
nRBC: 0 % (ref 0.0–0.2)

## 2019-08-15 LAB — BASIC METABOLIC PANEL
Anion gap: 11 (ref 5–15)
BUN: 21 mg/dL (ref 8–23)
CO2: 24 mmol/L (ref 22–32)
Calcium: 8.1 mg/dL — ABNORMAL LOW (ref 8.9–10.3)
Chloride: 96 mmol/L — ABNORMAL LOW (ref 98–111)
Creatinine, Ser: 0.96 mg/dL (ref 0.44–1.00)
GFR calc Af Amer: >60 mL/min (ref >60)
GFR calc non Af Amer: 55 mL/min — ABNORMAL LOW (ref >60)
Glucose, Bld: 78 mg/dL (ref 70–99)
Potassium: 4.4 mmol/L (ref 3.5–5.1)
Sodium: 131 mmol/L — ABNORMAL LOW (ref 135–145)

## 2019-08-15 LAB — HEMOGLOBIN A1C
Hgb A1c MFr Bld: 5.2 % (ref 4.8–5.6)
Mean Plasma Glucose: 102.54 mg/dL

## 2019-08-15 LAB — FERRITIN: Ferritin: 438 ng/mL — ABNORMAL HIGH (ref 11–307)

## 2019-08-15 LAB — C-REACTIVE PROTEIN: CRP: 9.2 mg/dL — ABNORMAL HIGH (ref <1.0)

## 2019-08-15 LAB — COMPREHENSIVE METABOLIC PANEL
ALT: 26 U/L (ref 0–44)
AST: 39 U/L (ref 15–41)
Albumin: 2.8 g/dL — ABNORMAL LOW (ref 3.5–5.0)
Alkaline Phosphatase: 53 U/L (ref 38–126)
Anion gap: 8 (ref 5–15)
BUN: 22 mg/dL (ref 8–23)
CO2: 25 mmol/L (ref 22–32)
Calcium: 7.9 mg/dL — ABNORMAL LOW (ref 8.9–10.3)
Chloride: 97 mmol/L — ABNORMAL LOW (ref 98–111)
Creatinine, Ser: 1.01 mg/dL — ABNORMAL HIGH (ref 0.44–1.00)
GFR calc Af Amer: >60 mL/min (ref >60)
GFR calc non Af Amer: 52 mL/min — ABNORMAL LOW (ref >60)
Glucose, Bld: 73 mg/dL (ref 70–99)
Potassium: 4.2 mmol/L (ref 3.5–5.1)
Sodium: 130 mmol/L — ABNORMAL LOW (ref 135–145)
Total Bilirubin: 0.6 mg/dL (ref 0.3–1.2)
Total Protein: 5.6 g/dL — ABNORMAL LOW (ref 6.5–8.1)

## 2019-08-15 LAB — D-DIMER, QUANTITATIVE: D-Dimer, Quant: 1.86 ug/mL-FEU — ABNORMAL HIGH (ref 0.00–0.50)

## 2019-08-15 LAB — LIPID PANEL
Cholesterol: 99 mg/dL (ref 0–200)
HDL: 36 mg/dL — ABNORMAL LOW (ref >40)
LDL Cholesterol: 50 mg/dL (ref 0–99)
Total CHOL/HDL Ratio: 2.8 RATIO
Triglycerides: 64 mg/dL (ref <150)
VLDL: 13 mg/dL (ref 0–40)

## 2019-08-15 LAB — MAGNESIUM: Magnesium: 1.8 mg/dL (ref 1.7–2.4)

## 2019-08-15 LAB — TROPONIN I (HIGH SENSITIVITY): Troponin I (High Sensitivity): 171 ng/L (ref <18)

## 2019-08-15 LAB — TSH: TSH: 3.363 u[IU]/mL (ref 0.350–4.500)

## 2019-08-15 MED ORDER — LOSARTAN POTASSIUM 50 MG PO TABS
50.0000 mg | ORAL_TABLET | Freq: Two times a day (BID) | ORAL | Status: DC
  Administered 2019-08-15 – 2019-08-21 (×11): 50 mg via ORAL
  Filled 2019-08-15 (×12): qty 1

## 2019-08-15 NOTE — Progress Notes (Signed)
Pt daughter Keene Breath called and updated on pt condition. All questions answered.

## 2019-08-15 NOTE — Evaluation (Addendum)
Occupational Therapy Evaluation Patient Details Name: Donna Quinn MRN: [id number] DOB: 1936-01-15 Today's Date: 08/15/2019    History of Present Illness Per MD note: Pt is an 84 y.o. female with medical history significant of hypertension, hyperlipidemia, COPD, stroke, GERD, anxiety, AAA, brain aneurysm CKD 3, who presents with generalized weakness fall.  MD assessment includes: pelvic pain with X-ray of her pelvis negative, COVID-19, elevated troponin possibly due to demand ischemia, HTN, HLD, COPD, Hyponatremia, h/o CVA, CKD III.   Clinical Impression   Donna Quinn was seen for OT evaluation this date. Pt was generally independent in BADL management living in a 2-level home (able to live on main level) with 1-step to enter. She denies additional falls history in the past year and endorses using a RW for all mobility. Pt reports becoming easily fatigued or out of breath with minimal exertion recently. She currently requires max assist for bed mobility as well as moderate to max assist for UB and LB ADL management due significant shortness of breath and poor activity tolerance. Pt educated in energy conservation strategies including pursed lip breathing, activity pacing, and falls prevention. This therapist guides pt through PLB on several occasions and encourages pt to implement while resting in bed. Pt requires moderate to max cueing for technique this date. Pt would benefit from additional skilled OT services to maximize recall and carryover of learned techniques and facilitate implementation of learned techniques into daily routines. Upon discharge, recommend STR to maximize pt safety and return to PLOF.     Follow Up Recommendations  SNF    Equipment Recommendations  Other (comment)(Defer to next venue of care.)    Recommendations for Other Services       Precautions / Restrictions Precautions Precautions: Fall Restrictions Weight Bearing Restrictions: No Other Position/Activity  Restrictions: Keep SpO2 > 92%      Mobility Bed Mobility Overal bed mobility: Needs Assistance Bed Mobility: Rolling;Sidelying to Sit;Sit to Sidelying Rolling: Mod assist Sidelying to sit: Max assist     Sit to sidelying: Max assist General bed mobility comments: Deferred at pt requests. Pt endorses significant fatigue after PT session and declines mobility attempts this date. Per physical therapist, pt requires max assist for bed mobility.  Transfers Overall transfer level: Needs assistance Equipment used: Rolling walker (2 wheeled) Transfers: Sit to/from Stand Sit to Stand: From elevated surface;Min assist         General transfer comment: Mod verbal cues for hand placement    Balance Overall balance assessment: Needs assistance Sitting-balance support: Feet supported;Bilateral upper extremity supported Sitting balance-Leahy Scale: Fair     Standing balance support: Bilateral upper extremity supported Standing balance-Leahy Scale: Fair Standing balance comment: Mod lean on the RW for support but  no LOB                           ADL either performed or assessed with clinical judgement   ADL Overall ADL's : Needs assistance/impaired                                       General ADL Comments: Pt functionally limited by SOB, generalized weakness, and poor activity tolerance. Requires moderate assist for functional mobility and ADL management including UB ADL tasks. She is unable to use BUE to place lower dentures this date and requires moderate assist from this therapist. Pt educated on  energy conservation strategies with emphasis on d pursed lip breathing technique this date. Pt has difficulty return demonstrating understanding of education provided.     Vision Baseline Vision/History: No visual deficits Patient Visual Report: No change from baseline       Perception     Praxis      Pertinent Vitals/Pain Pain Assessment: No/denies  pain Pain Score: 1  Pain Location: General bilateral pelvic pain Pain Descriptors / Indicators: Sore Pain Intervention(s): Premedicated before session;Monitored during session     Hand Dominance Right   Extremity/Trunk Assessment Upper Extremity Assessment Upper Extremity Assessment: Generalized weakness   Lower Extremity Assessment Lower Extremity Assessment: Generalized weakness       Communication Communication Communication: Expressive difficulties(Pt very soft spoken with labored breathing t/o eval.)   Cognition Arousal/Alertness: Lethargic Behavior During Therapy: Flat affect Overall Cognitive Status: No family/caregiver present to determine baseline cognitive functioning                                 General Comments: Pt answers yes/no questions, and has difficulty elaborating (suspect due to difficulty breathing this date).   General Comments  Pt breathing noted to be shallow and labored throughout session. Significant time taken for education in PLB strategies and energy conservation. RN aware.    Exercises  Other Exercises: Pt educated on role of OT in acute care setting, falls prevention strategies for home and hospital, and energy conservation strategies with strong emphasis on pursed lip breathing this date. Pt requires moderate cueing to use/ return demonstrate technique a   Shoulder Instructions      Home Living Family/patient expects to be discharged to:: Private residence Living Arrangements: Alone Available Help at Discharge: Family;Available PRN/intermittently Type of Home: House Home Access: Stairs to enter [employer] of Steps: 1 Entrance Stairs-Rails: Right;Left;Can reach both Home Layout: Two level;Able to live on main level with bedroom/bathroom     Bathroom Shower/Tub: Tub/shower unit   Bathroom Toilet: Handicapped height     Home Equipment: Environmental consultant - 2 wheels;Cane - single point;Bedside commode;Grab bars -  tub/shower          Prior Functioning/Environment Level of Independence: Independent with assistive device(s)        Comments: Mod Ind amb with a RW, Ind with ADLs        OT Problem List: Decreased strength;Decreased coordination;Cardiopulmonary status limiting activity;Decreased activity tolerance;Decreased safety awareness;Impaired balance (sitting and/or standing);Decreased knowledge of use of DME or AE;Decreased range of motion      OT Treatment/Interventions: Self-care/ADL training;Therapeutic exercise;Therapeutic activities;DME and/or AE instruction;Patient/family education;Balance training;Energy conservation    OT Goals(Current goals can be found in the care plan section) Acute Rehab OT Goals Patient Stated Goal: To get stronger OT Goal Formulation: With patient Time For Goal Achievement: 08/29/19 Potential to Achieve Goals: Good ADL Goals Pt Will Perform Upper Body Dressing: sitting;with adaptive equipment;with min assist Pt Will Perform Lower Body Dressing: with adaptive equipment;sit to/from stand;with min assist(With LRAD PRN for improved safety and functional independence.) Additional ADL Goal #1: Pt will independently verbalize a plan to implement at least 3 learned energy conservation strategies into her daily routines for improved safety and functional independence upon hospital discharge  OT Frequency: Min 1X/week   Barriers to D/C: Decreased caregiver support          Co-evaluation              AM-PAC OT "6 Clicks" Daily  Activity     Outcome Measure Help from another person eating meals?: A Little Help from another person taking care of personal grooming?: A Lot Help from another person toileting, which includes using toliet, bedpan, or urinal?: A Lot Help from another person bathing (including washing, rinsing, drying)?: A Lot Help from another person to put on and taking off regular upper body clothing?: A Lot Help from another person to put on  and taking off regular lower body clothing?: A Lot 6 Click Score: 13   End of Session Equipment Utilized During Treatment: Gait belt;Rolling walker  Activity Tolerance: Patient limited by fatigue Patient left: in bed;with call bell/phone within reach;with bed alarm set  OT Visit Diagnosis: Muscle weakness (generalized) (M62.81);Other abnormalities of gait and mobility (R26.89)                Time: 1100-1126 OT Time Calculation (min): 26 min Charges:  OT General Charges $OT Visit: 1 Visit OT Evaluation $OT Eval Moderate Complexity: 1 Mod OT Treatments $Self Care/Home Management : 8-22 mins  Rockney Ghee, M.S., OTR/L Ascom: [phone] 08/15/19, 4:10 PM

## 2019-08-15 NOTE — Evaluation (Signed)
Physical Therapy Evaluation Patient Details Name: Donna Quinn MRN: [id number] DOB: 18-Jan-1936 Today's Date: 08/15/2019   History of Present Illness  Per MD note: Pt is an 84 y.o. female with medical history significant of hypertension, hyperlipidemia, COPD, stroke, GERD, anxiety, AAA, brain aneurysm CKD 3, who presents with generalized weakness fall.  MD assessment includes: pelvic pain with X-ray of her pelvis negative, COVID-19, elevated troponin possibly due to demand ischemia, HTN, HLD, COPD, Hyponatremia, h/o CVA, CKD III.    Clinical Impression  Pt presented with deficits in strength, transfers, mobility, gait, balance, and activity tolerance.  Pt with somewhat labored breathing during the session with SpO2 and HR measured frequently throughout the session with SpO2 96% and HR in the 80s.  No increased effort of breathing/SOB with activity noted during the session.  Pt participated throughout the session but ultimately was very limited functionally requiring significant physical assistance with bed mobility and transfers and only able to amb a max of several very small steps.  Pt will benefit from PT services in a SNF setting upon discharge to safely address above deficits for decreased caregiver assistance and eventual return to PLOF.      Follow Up Recommendations SNF    Equipment Recommendations  None recommended by PT    Recommendations for Other Services       Precautions / Restrictions Precautions Precautions: Fall Restrictions Weight Bearing Restrictions: No Other Position/Activity Restrictions: Keep SpO2 > 92%      Mobility  Bed Mobility Overal bed mobility: Needs Assistance Bed Mobility: Rolling;Sidelying to Sit;Sit to Sidelying Rolling: Mod assist Sidelying to sit: Max assist     Sit to sidelying: Max assist General bed mobility comments: Extensive physical assistance for all bed mobility tasks along with verbal cues for sequencing  Transfers Overall  transfer level: Needs assistance Equipment used: Rolling walker (2 wheeled) Transfers: Sit to/from Stand Sit to Stand: From elevated surface;Min assist         General transfer comment: Mod verbal cues for hand placement  Ambulation/Gait Ambulation/Gait assistance: Min assist Gait Distance (Feet): 2 Feet Assistive device: Rolling walker (2 wheeled) Gait Pattern/deviations: Step-to pattern;Decreased step length - right;Decreased step length - left;Trunk flexed Gait velocity: deccreased   General Gait Details: Mod lean on the RW with pt only able to take several very small steps at the EOB before requiring to return to sitting  Stairs            Wheelchair Mobility    Modified Rankin (Stroke Patients Only)       Balance Overall balance assessment: Needs assistance Sitting-balance support: Feet supported;Bilateral upper extremity supported Sitting balance-Leahy Scale: Fair     Standing balance support: Bilateral upper extremity supported Standing balance-Leahy Scale: Fair Standing balance comment: Mod lean on the RW for support but  no LOB                             Pertinent Vitals/Pain Pain Assessment: 0-10 Pain Score: 1  Pain Location: General bilateral pelvic pain Pain Descriptors / Indicators: Sore Pain Intervention(s): Premedicated before session;Monitored during session    Home Living Family/patient expects to be discharged to:: Private residence Living Arrangements: Alone Available Help at Discharge: Family;Available PRN/intermittently Type of Home: House Home Access: Stairs to enter Entrance Stairs-Rails: Right;Left;Can reach both Entrance Stairs-Number of Steps: 1 Home Layout: Two level;Able to live on main level with bedroom/bathroom Home Equipment: Dan Humphreys - 2 wheels;Cane - single point;Bedside  commode;Grab bars - tub/shower      Prior Function Level of Independence: Independent with assistive device(s)         Comments: Mod  Ind amb with a RW, Ind with ADLs     Hand Dominance        Extremity/Trunk Assessment   Upper Extremity Assessment Upper Extremity Assessment: Generalized weakness    Lower Extremity Assessment Lower Extremity Assessment: Generalized weakness       Communication   Communication: No difficulties  Cognition Arousal/Alertness: Lethargic Behavior During Therapy: Flat affect Overall Cognitive Status: No family/caregiver present to determine baseline cognitive functioning                                        General Comments      Exercises Total Joint Exercises Ankle Circles/Pumps: AROM;Both;10 reps Quad Sets: Strengthening;Both;10 reps Towel Squeeze: Strengthening;Both;5 reps Short Arc Quad: AROM;Both;10 reps Heel Slides: AAROM;Both;5 reps Hip ABduction/ADduction: AAROM;Both;10 reps Straight Leg Raises: AAROM;Both;10 reps Long Arc Quad: Strengthening;Both;10 reps Knee Flexion: Strengthening;Both;10 reps Marching in Standing: AROM;Both;5 reps;Standing Other Exercises Other Exercises: Static sitting at the EOB with cues for breathing technique   Assessment/Plan    PT Assessment Patient needs continued PT services  PT Problem List Decreased strength;Decreased activity tolerance;Decreased balance;Decreased mobility;Decreased knowledge of use of DME       PT Treatment Interventions DME instruction;Gait training;Stair training;Functional mobility training;Therapeutic activities;Therapeutic exercise;Balance training;Patient/family education    PT Goals (Current goals can be found in the Care Plan section)  Acute Rehab PT Goals Patient Stated Goal: To get stronger PT Goal Formulation: With patient Time For Goal Achievement: 08/28/19 Potential to Achieve Goals: Fair    Frequency Min 2X/week   Barriers to discharge Inaccessible home environment;Decreased caregiver support      Co-evaluation               AM-PAC PT "6 Clicks" Mobility   Outcome Measure Help needed turning from your back to your side while in a flat bed without using bedrails?: A Lot Help needed moving from lying on your back to sitting on the side of a flat bed without using bedrails?: A Lot Help needed moving to and from a bed to a chair (including a wheelchair)?: A Lot Help needed standing up from a chair using your arms (e.g., wheelchair or bedside chair)?: A Lot Help needed to walk in hospital room?: Total Help needed climbing 3-5 steps with a railing? : Total 6 Click Score: 10    End of Session Equipment Utilized During Treatment: Gait belt Activity Tolerance: Patient limited by fatigue Patient left: in bed;with call bell/phone within reach;with bed alarm set Nurse Communication: Mobility status PT Visit Diagnosis: History of falling (Z91.81);Difficulty in walking, not elsewhere classified (R26.2);Muscle weakness (generalized) (M62.81)    Time: 3220-2542 PT Time Calculation (min) (ACUTE ONLY): 65 min   Charges:   PT Evaluation $PT Eval Moderate Complexity: 1 Mod PT Treatments $Therapeutic Exercise: 8-22 mins $Therapeutic Activity: 8-22 mins        D. Scott Ilir Mahrt PT, DPT 08/15/19, 1:08 PM

## 2019-08-15 NOTE — Progress Notes (Signed)
PROGRESS NOTE    Donna Quinn  WUX:[id number] DOB: July 19, 1936 DOA: 08/14/2019 PCP: Marguarite Arbour, MD    Assessment & Plan:   Principal Problem:   Fall Active Problems:   HTN (hypertension)   HLD (hyperlipidemia)   COPD (chronic obstructive pulmonary disease) (HCC)   GERD (gastroesophageal reflux disease)   Hyponatremia   Stroke (HCC)   COVID-19 virus infection   CKD (chronic kidney disease), stage IIIa   Elevated troponin   Debility    Donna Quinn is a 84 y.o. Caucasian female with medical history significant of hypertension, hyperlipidemia, COPD, stroke, GERD, anxiety, AAA, brain aneurysm CKD 3, who presents with generalized weakness fall.   Fall and weakness Patient complains of pelvic pain.  X-ray of her pelvis is negative.  CT-head no acute finding. --Lives alone and walks with walker at baseline. PLAN: -PT/OT rec SNF  COVID-19 virus infection:  Patient does not have oxygen desaturation.  Chest x-ray check mild right basilar infiltration. -hold remdesivir and steroid -Vitamin C and zinc -check inflammatory marker  Mild Elevated trop 2/2 demand ischemia  Troponin I 65, flat.  No chest pain.  HTN (hypertension):  Blood pressure is elevated at 180/70 -resume home losartan -d/c amlodipine 10 mg daily -Continue home hydralazine -As needed hydralazine  HLD (hyperlipidemia): -lipitor  COPD (chronic obstructive pulmonary disease) (HCC): -Bronchodilators  GERD (gastroesophageal reflux disease): -PPI  Hyponatremia, chronic, stable --d/c MIVF  Stroke [facility]): -continue plavix and lipitor  CKD (chronic kidney disease), stage IIIa: stable. Cre 1.17   DVT prophylaxis: Lovenox SQ Code Status: Full code  Family Communication: not today Disposition Plan: SNF   Subjective and Interval History:  Pt reported not feeling good, however, could not elaborate.  No fever, N/V/D, dysuria.  Pt said she lives alone, and walks with a walker, but had  not gotten out of bed since presentation.   Objective: Vitals:   08/14/19 2215 08/15/19 0628 08/15/19 0823 08/15/19 1700  BP: (!) 190/84 (!) 142/77 (!) 150/72 (!) 146/76  Pulse: 77 81 83 76  Resp: 20 19  20   Temp: 98.1 F (36.7 C) 98 F (36.7 C)  98.6 F (37 C)  TempSrc: Oral Oral    SpO2: 96% 98% 95% 97%  Weight:  52 kg    Height:        Intake/Output Summary (Last 24 hours) at 08/15/2019 1855 Last data filed at 08/15/2019 1700 Gross per 24 hour  Intake -  Output 1100 ml  Net -1100 ml   Filed Weights   08/14/19 0930 08/15/19 0628  Weight: 52.2 kg 52 kg    Examination:   Constitutional: NAD, alert, oriented, but seemed slow in processing HEENT: conjunctivae and lids normal, EOMI CV: RRR no M,R,G. Distal pulses +2.  No cyanosis.   RESP: CTA B/L, normal respiratory effort  GI: +BS, NTND Extremities: No effusions, edema, or tenderness in BLE SKIN: warm, dry and intact Neuro: II - XII grossly intact.  Sensation intact   Data Reviewed: I have personally reviewed following labs and imaging studies  CBC: Recent Labs  Lab 08/14/19 0940 08/15/19 0130  WBC 4.9 4.1  NEUTROABS  --  3.1  HGB 10.5* 8.9*  HCT 31.9* 26.8*  MCV 82.2 82.0  PLT 246 217   Basic Metabolic Panel: Recent Labs  Lab 08/14/19 0940 08/14/19 2003 08/15/19 0130 08/15/19 0833  NA 128* 129* 130* 131*  K 4.5 4.3 4.2 4.4  CL 90* 94* 97* 96*  CO2 27 27 25  24  GLUCOSE 120* 86 73 78  BUN 24* 25* 22 21  CREATININE 1.17* 1.10* 1.01* 0.96  CALCIUM 8.7* 8.0* 7.9* 8.1*  MG  --   --  1.8  --    GFR: Estimated Creatinine Clearance: 37.7 mL/min (by C-G formula based on SCr of 0.96 mg/dL). Liver Function Tests: Recent Labs  Lab 08/15/19 0130  AST 39  ALT 26  ALKPHOS 53  BILITOT 0.6  PROT 5.6*  ALBUMIN 2.8*   No results for input(s): LIPASE, AMYLASE in the last 168 hours. No results for input(s): AMMONIA in the last 168 hours. Coagulation Profile: No results for input(s): INR, PROTIME in the  last 168 hours. Cardiac Enzymes: No results for input(s): CKTOTAL, CKMB, CKMBINDEX, TROPONINI in the last 168 hours. BNP (last 3 results) No results for input(s): PROBNP in the last 8760 hours. HbA1C: Recent Labs    08/15/19 0130  HGBA1C 5.2   CBG: No results for input(s): GLUCAP in the last 168 hours. Lipid Profile: Recent Labs    08/14/19 1518 08/15/19 0130  CHOL  --  99  HDL  --  36*  LDLCALC  --  50  TRIG 46 64  CHOLHDL  --  2.8   Thyroid Function Tests: Recent Labs    08/15/19 0130  TSH 3.363   Anemia Panel: Recent Labs    08/14/19 0940 08/15/19 0130  FERRITIN 555* 438*   Sepsis Labs: Recent Labs  Lab 08/14/19 0940 08/14/19 1151 08/14/19 1518  PROCALCITON  --   --  0.11  LATICACIDVEN 1.0 1.1  --     No results found for this or any previous visit (from the past 240 hour(s)).    Radiology Studies: DG Chest 2 View  Result Date: 08/14/2019 CLINICAL DATA:  Shortness of breath. EXAM: CHEST - 2 VIEW COMPARISON:  08/05/2018.  CT 03/21/2018. FINDINGS: Mediastinum and hilar structures normal. Right base infiltrate consistent with pneumonia. Small right pleural effusion. No pneumothorax. Biapical pleural thickening consistent scarring. Cardiomegaly with normal pulmonary vascularity. Aortic and peripheral vascular calcification. No acute bony abnormality identified. IMPRESSION: 1. Right base infiltrate consistent pneumonia. Small right pleural effusion. 2.  Cardiomegaly.  No pulmonary venous congestion. 3.  Sliding hiatal hernia again noted. Electronically Signed   By: Maisie Fus  Register   On: 08/14/2019 10:31   DG Pelvis 1-2 Views  Result Date: 08/14/2019 CLINICAL DATA:  Fall. EXAM: PELVIS - 1-2 VIEW COMPARISON:  05/02/2018. FINDINGS: Diffuse osteopenia. Degenerative changes lumbar spine and left hip. Total right hip replacement. No acute bony abnormality. No evidence of fracture dislocation. Stable sclerotic density left buttock regions suggesting injection granuloma.  Pelvic calcifications consistent with phleboliths. Aortoiliac and peripheral atherosclerotic vascular calcification. Bilateral iliac stents. IMPRESSION: 1. Diffuse osteopenia. Degenerative changes lumbar spine left hip. Total right hip replacement. No acute bony abnormality. 2. Aortoiliac and peripheral vascular disease. Bilateral iliac stents. Electronically Signed   By: Maisie Fus  Register   On: 08/14/2019 12:14   CT HEAD WO CONTRAST  Result Date: 08/14/2019 CLINICAL DATA:  Headache with head trauma. Pain to the back of the head. EXAM: CT HEAD WITHOUT CONTRAST TECHNIQUE: Contiguous axial images were obtained from the base of the skull through the vertex without intravenous contrast. COMPARISON:  None. FINDINGS: Brain: No evidence of acute infarction, hemorrhage, hydrocephalus, extra-axial collection or mass lesion/mass effect. Atrophy and chronic microvascular ischemic changes are noted. There is encephalomalacia involving the left occipital lobe as well as the left frontal lobe and left cerebellum. The study was degraded  by motion artifact. Vascular: Again noted are metallic coils within the left suprasellar region is seen on multiple prior studies. Skull: Normal. Negative for fracture or focal lesion. Sinuses/Orbits: No acute finding. Other: None. IMPRESSION: 1. Motion degraded study. 2. No acute intracranial abnormality. 3. Chronic findings as detailed above. Electronically Signed   By: Constance Holster M.D.   On: 08/14/2019 19:04     Scheduled Meds: . amitriptyline  25 mg Oral QHS  . amLODipine  10 mg Oral Daily  . vitamin C  500 mg Oral Daily  . atorvastatin  40 mg Oral QPM  . clopidogrel  75 mg Oral BH-q7a  . dextromethorphan-guaiFENesin  1 tablet Oral BID  . DULoxetine  20 mg Oral Daily  . enoxaparin (LOVENOX) injection  40 mg Subcutaneous Q24H  . gabapentin  300 mg Oral BID  . hydrALAZINE  100 mg Oral BID  . ipratropium  2 puff Inhalation Q6H  . megestrol  400 mg Oral Daily  .  pantoprazole  40 mg Oral Daily  . umeclidinium-vilanterol  1 puff Inhalation Daily  . vitamin B-12  1,000 mcg Oral Daily  . zinc sulfate  220 mg Oral Daily   Continuous Infusions:   LOS: 0 days     Enzo Bi, MD [facility] If 7PM-7AM, please contact night-coverage 08/15/2019, 6:55 PM

## 2019-08-15 NOTE — TOC Initial Note (Signed)
Transition of Care [facility]) - Initial/Assessment Note    Patient Details  Name: Donna Quinn MRN: [id number] Date of Birth: December 06, 1935  Transition of [facility]) CM/SW Contact:    Shelbie Hutching, RN Phone Number: 08/15/2019, 10:55 AM  Clinical Narrative:                 Patient placed in observation after a fall at home, patient found to have COVID 19 in the emergency department.  Patient has a history of COPD.  Patient lives alone and walks with a walker at home.  Patient has 5 children that check in on her at home everyday.  Family cooks for her and gets her groceries.  Patient has Life Alert.   Patient verbalizes that she would like to go home at discharge.  RNCM discussed with daughter Rodena Piety skilled nursing rehab.  Rodena Piety would like to see what PT recommends.  Patient would have to go outside of [facility] for skilled nursing because none of the local facilities are accepting COVID + patients.   RNCM will cont to follow for discharge needs.   Expected Discharge Plan: [place] Barriers to Discharge: Continued Medical Work up   Patient Goals and CMS Choice Patient states their goals for this hospitalization and ongoing recovery are:: Wants to go home      Expected Discharge Plan and Services Expected Discharge Plan: [place]   Discharge Planning Services: CM Consult   Living arrangements for the past 2 months: Single Family Home Expected Discharge Date: 08/15/19                                    Prior Living Arrangements/Services Living arrangements for the past 2 months: Single Family Home Lives with:: Self Patient language and need for interpreter reviewed:: Yes Do you feel safe going back to the place where you live?: Yes      Need for Family Participation in Patient Care: Yes (Comment)(COPD, COVID) Care giver support system in place?: Yes (comment) Current home services: DME(walker) Criminal Activity/Legal Involvement  Pertinent to Current Situation/Hospitalization: No - Comment as needed  Activities of Daily Living Home Assistive Devices/Equipment: Gilford Rile (specify type) ADL Screening (condition at time of admission) Patient's cognitive ability adequate to safely complete daily activities?: Yes Is the patient deaf or have difficulty hearing?: No Does the patient have difficulty seeing, even when wearing glasses/contacts?: No Does the patient have difficulty concentrating, remembering, or making decisions?: No Patient able to express need for assistance with ADLs?: Yes Does the patient have difficulty dressing or bathing?: No Independently performs ADLs?: No Communication: Independent Dressing (OT): Needs assistance Is this a change from baseline?: Pre-admission baseline Grooming: Needs assistance Is this a change from baseline?: Pre-admission baseline Feeding: Independent Bathing: Needs assistance Is this a change from baseline?: Pre-admission baseline Toileting: Needs assistance Is this a change from baseline?: Pre-admission baseline In/Out Bed: Appropriate for developmental age Is this a change from baseline?: Pre-admission baseline Walks in Home: Independent with device (comment) Does the patient have difficulty walking or climbing stairs?: Yes Weakness of Legs: Both Weakness of Arms/Hands: Both  Permission Sought/Granted Permission sought to share information with : Case Manager, Family Supports Permission granted to share information with : Yes, Verbal Permission Granted        Permission granted to share info w Relationship: Daughters     Emotional Assessment  Attitude/Demeanor/Rapport: Avoidant, Other (comment)(very slow to respond) Affect (typically observed): Quiet, Withdrawn Orientation: : Oriented to Self, Oriented to Place, Oriented to  Time, Oriented to Situation Alcohol / Substance Use: Not Applicable Psych Involvement: No (comment)  Admission diagnosis:  Weakness  [R53.1] Fall [W19.XXXA] Fall, initial encounter [id number]] COVID-19 [U07.1] Patient Active Problem List   Diagnosis Date Noted  . Elevated troponin 08/14/2019  . Hypokalemia 08/05/2018  . COPD with exacerbation (HCC) 08/05/2018  . Hyponatremia 05/03/2018  . Dehydration 05/02/2018  . Fracture of femoral neck, right (HCC) 10/19/2016  . HTN (hypertension) 10/19/2016  . HLD (hyperlipidemia) 10/19/2016  . COPD (chronic obstructive pulmonary disease) (HCC) 10/19/2016  . GERD (gastroesophageal reflux disease) 10/19/2016  . COPD exacerbation (HCC) 08/06/2016  . Stroke (HCC) 2016  . COVID-19 virus infection 2016  . Fall 2016  . CKD (chronic kidney disease), stage IIIa 2016   PCP:  Marguarite Arbour, MD Pharmacy:   [facility] [phone] - [place], [place] - [address] AT [facility] OF [address] [place] [address] [place] [address] Phone: [phone] Fax: [phone]  [facility] - [place], [place] - [address] [place] [address] Phone: [phone] Fax: [phone]     Social Determinants of Health (SDOH) Interventions    Readmission Risk Interventions No flowsheet data found.

## 2019-08-16 LAB — COMPREHENSIVE METABOLIC PANEL
ALT: 22 U/L (ref 0–44)
AST: 34 U/L (ref 15–41)
Albumin: 2.7 g/dL — ABNORMAL LOW (ref 3.5–5.0)
Alkaline Phosphatase: 48 U/L (ref 38–126)
Anion gap: 10 (ref 5–15)
BUN: 16 mg/dL (ref 8–23)
CO2: 23 mmol/L (ref 22–32)
Calcium: 8 mg/dL — ABNORMAL LOW (ref 8.9–10.3)
Chloride: 98 mmol/L (ref 98–111)
Creatinine, Ser: 0.94 mg/dL (ref 0.44–1.00)
GFR calc Af Amer: >60 mL/min (ref >60)
GFR calc non Af Amer: 57 mL/min — ABNORMAL LOW (ref >60)
Glucose, Bld: 70 mg/dL (ref 70–99)
Potassium: 4.1 mmol/L (ref 3.5–5.1)
Sodium: 131 mmol/L — ABNORMAL LOW (ref 135–145)
Total Bilirubin: 0.8 mg/dL (ref 0.3–1.2)
Total Protein: 5.7 g/dL — ABNORMAL LOW (ref 6.5–8.1)

## 2019-08-16 LAB — CBC WITH DIFFERENTIAL/PLATELET
Abs Immature Granulocytes: 0.02 10*3/uL (ref 0.00–0.07)
Basophils Absolute: 0 10*3/uL (ref 0.0–0.1)
Basophils Relative: 0 %
Eosinophils Absolute: 0 10*3/uL (ref 0.0–0.5)
Eosinophils Relative: 0 %
HCT: 28.2 % — ABNORMAL LOW (ref 36.0–46.0)
Hemoglobin: 9.1 g/dL — ABNORMAL LOW (ref 12.0–15.0)
Immature Granulocytes: 0 %
Lymphocytes Relative: 14 %
Lymphs Abs: 0.7 10*3/uL (ref 0.7–4.0)
MCH: 26.6 pg (ref 26.0–34.0)
MCHC: 32.3 g/dL (ref 30.0–36.0)
MCV: 82.5 fL (ref 80.0–100.0)
Monocytes Absolute: 0.4 10*3/uL (ref 0.1–1.0)
Monocytes Relative: 7 %
Neutro Abs: 4.2 10*3/uL (ref 1.7–7.7)
Neutrophils Relative %: 79 %
Platelets: 245 10*3/uL (ref 150–400)
RBC: 3.42 MIL/uL — ABNORMAL LOW (ref 3.87–5.11)
RDW: 14.5 % (ref 11.5–15.5)
WBC: 5.3 10*3/uL (ref 4.0–10.5)
nRBC: 0 % (ref 0.0–0.2)

## 2019-08-16 LAB — FERRITIN: Ferritin: 472 ng/mL — ABNORMAL HIGH (ref 11–307)

## 2019-08-16 LAB — FIBRIN DERIVATIVES D-DIMER (ARMC ONLY): Fibrin derivatives D-dimer (ARMC): 1527.41 ng/mL (FEU) — ABNORMAL HIGH (ref 0.00–499.00)

## 2019-08-16 LAB — C-REACTIVE PROTEIN: CRP: 8.2 mg/dL — ABNORMAL HIGH (ref <1.0)

## 2019-08-16 LAB — MAGNESIUM: Magnesium: 1.7 mg/dL (ref 1.7–2.4)

## 2019-08-16 NOTE — Progress Notes (Signed)
Pt daughter called and updated. No questions at this time.

## 2019-08-16 NOTE — Progress Notes (Signed)
PROGRESS NOTE    Donna Quinn  EHU:[id number] DOB: 1936-03-25 DOA: 08/14/2019 PCP: Marguarite Arbour, MD    Assessment & Plan:   Principal Problem:   Fall Active Problems:   HTN (hypertension)   HLD (hyperlipidemia)   COPD (chronic obstructive pulmonary disease) (HCC)   GERD (gastroesophageal reflux disease)   Hyponatremia   Stroke (HCC)   COVID-19 virus infection   CKD (chronic kidney disease), stage IIIa   Elevated troponin   Debility    Donna Quinn is a 84 y.o. Caucasian female with medical history significant of hypertension, hyperlipidemia, COPD, stroke, GERD, anxiety, AAA, brain aneurysm CKD 3, who presents with generalized weakness fall.   Fall and weakness Patient complains of pelvic pain.  X-ray of her pelvis is negative.  CT-head no acute finding. --Lives alone and walks with walker at baseline. PLAN: -PT/OT rec SNF  COVID-19 virus infection:  Patient does not have oxygen desaturation.  Chest x-ray check mild right basilar infiltration. -hold remdesivir and steroid -Vitamin C and zinc -check inflammatory marker  Mild Elevated trop 2/2 demand ischemia  Troponin I 65, flat.  No chest pain.  HTN (hypertension):  Blood pressure is elevated at 180/70 -continue home losartan -Continue home hydralazine -As needed hydralazine  HLD (hyperlipidemia): -lipitor  COPD (chronic obstructive pulmonary disease) (HCC): -Bronchodilators  GERD (gastroesophageal reflux disease): -PPI  Hyponatremia, chronic, stable --s/p MIVF --encourage PO hydration  Stroke [facility]): -continue plavix and lipitor  CKD (chronic kidney disease), stage IIIa: stable.  Cre 1.17   DVT prophylaxis: Lovenox SQ Code Status: Full code  Family Communication: not today Disposition Plan: SNF   Subjective and Interval History:  Pt was not forthcoming again with answering questions, but was looking for her "teeth."  No fever, N/V/D.  Waiting on SNF  bed.    Objective: Vitals:   08/15/19 1700 08/15/19 2214 08/16/19 0832 08/16/19 1546  BP: (!) 146/76 (!) 145/65 (!) 127/55 140/60  Pulse: 76 78 73 74  Resp: 20 20 (!) 21 (!) 21  Temp: 98.6 F (37 C) 99.5 F (37.5 C) 99.6 F (37.6 C) 98.6 F (37 C)  TempSrc:   Oral Oral  SpO2: 97% 96% 94% 92%  Weight:      Height:        Intake/Output Summary (Last 24 hours) at 08/16/2019 1756 Last data filed at 08/16/2019 0600 Gross per 24 hour  Intake --  Output 500 ml  Net -500 ml   Filed Weights   08/14/19 0930 08/15/19 0628  Weight: 52.2 kg 52 kg    Examination:   Constitutional: NAD, alert, appeared brighter today HEENT: conjunctivae and lids normal, EOMI CV: RRR no M,R,G. Distal pulses +2.  No cyanosis.   RESP: CTA B/L over anterior, normal respiratory effort, on RA GI: +BS, NTND Extremities: No effusions, edema, or tenderness in BLE SKIN: warm, dry and intact Neuro: II - XII grossly intact.  Sensation intact   Data Reviewed: I have personally reviewed following labs and imaging studies  CBC: Recent Labs  Lab 08/14/19 0940 08/15/19 0130 08/16/19 0323  WBC 4.9 4.1 5.3  NEUTROABS  --  3.1 4.2  HGB 10.5* 8.9* 9.1*  HCT 31.9* 26.8* 28.2*  MCV 82.2 82.0 82.5  PLT 246 217 245   Basic Metabolic Panel: Recent Labs  Lab 08/14/19 0940 08/14/19 2003 08/15/19 0130 08/15/19 0833 08/16/19 0323  NA 128* 129* 130* 131* 131*  K 4.5 4.3 4.2 4.4 4.1  CL 90* 94* 97* 96* 98  CO2 27 27 25 24 23   GLUCOSE 120* 86 73 78 70  BUN 24* 25* 22 21 16   CREATININE 1.17* 1.10* 1.01* 0.96 0.94  CALCIUM 8.7* 8.0* 7.9* 8.1* 8.0*  MG  --   --  1.8  --  1.7   GFR: Estimated Creatinine Clearance: 38.5 mL/min (by C-G formula based on SCr of 0.94 mg/dL). Liver Function Tests: Recent Labs  Lab 08/15/19 0130 08/16/19 0323  AST 39 34  ALT 26 22  ALKPHOS 53 48  BILITOT 0.6 0.8  PROT 5.6* 5.7*  ALBUMIN 2.8* 2.7*   No results for input(s): LIPASE, AMYLASE in the last 168 hours. No  results for input(s): AMMONIA in the last 168 hours. Coagulation Profile: No results for input(s): INR, PROTIME in the last 168 hours. Cardiac Enzymes: No results for input(s): CKTOTAL, CKMB, CKMBINDEX, TROPONINI in the last 168 hours. BNP (last 3 results) No results for input(s): PROBNP in the last 8760 hours. HbA1C: Recent Labs    08/15/19 0130  HGBA1C 5.2   CBG: No results for input(s): GLUCAP in the last 168 hours. Lipid Profile: Recent Labs    08/14/19 1518 08/15/19 0130  CHOL  --  99  HDL  --  36*  LDLCALC  --  50  TRIG 46 64  CHOLHDL  --  2.8   Thyroid Function Tests: Recent Labs    08/15/19 0130  TSH 3.363   Anemia Panel: Recent Labs    08/15/19 0130 08/16/19 0323  FERRITIN 438* 472*   Sepsis Labs: Recent Labs  Lab 08/14/19 0940 08/14/19 1151 08/14/19 1518  PROCALCITON  --   --  0.11  LATICACIDVEN 1.0 1.1  --     No results found for this or any previous visit (from the past 240 hour(s)).    Radiology Studies: CT HEAD WO CONTRAST  Result Date: 08/14/2019 CLINICAL DATA:  Headache with head trauma. Pain to the back of the head. EXAM: CT HEAD WITHOUT CONTRAST TECHNIQUE: Contiguous axial images were obtained from the base of the skull through the vertex without intravenous contrast. COMPARISON:  None. FINDINGS: Brain: No evidence of acute infarction, hemorrhage, hydrocephalus, extra-axial collection or mass lesion/mass effect. Atrophy and chronic microvascular ischemic changes are noted. There is encephalomalacia involving the left occipital lobe as well as the left frontal lobe and left cerebellum. The study was degraded by motion artifact. Vascular: Again noted are metallic coils within the left suprasellar region is seen on multiple prior studies. Skull: Normal. Negative for fracture or focal lesion. Sinuses/Orbits: No acute finding. Other: None. IMPRESSION: 1. Motion degraded study. 2. No acute intracranial abnormality. 3. Chronic findings as detailed  above. Electronically Signed   By: Constance Holster M.D.   On: 08/14/2019 19:04     Scheduled Meds: . amitriptyline  25 mg Oral QHS  . vitamin C  500 mg Oral Daily  . atorvastatin  40 mg Oral QPM  . clopidogrel  75 mg Oral BH-q7a  . dextromethorphan-guaiFENesin  1 tablet Oral BID  . DULoxetine  20 mg Oral Daily  . enoxaparin (LOVENOX) injection  40 mg Subcutaneous Q24H  . gabapentin  300 mg Oral BID  . hydrALAZINE  100 mg Oral BID  . ipratropium  2 puff Inhalation Q6H  . losartan  50 mg Oral BID  . megestrol  400 mg Oral Daily  . pantoprazole  40 mg Oral Daily  . umeclidinium-vilanterol  1 puff Inhalation Daily  . vitamin B-12  1,000 mcg Oral Daily  .  zinc sulfate  220 mg Oral Daily   Continuous Infusions:   LOS: 1 day     Darlin Priestly, MD [facility] If 7PM-7AM, please contact night-coverage 08/16/2019, 5:56 PM

## 2019-08-16 NOTE — Progress Notes (Signed)
Physical Therapy Treatment Patient Details Name: Donna Quinn MRN: [id number] DOB: [birth year] Today's Date: 08/16/2019    History of Present Illness Per MD note: Pt is an 84 y.o. female with medical history significant of hypertension, hyperlipidemia, COPD, stroke, GERD, anxiety, AAA, brain aneurysm CKD 3, who presents with generalized weakness fall.  MD assessment includes: pelvic pain with X-ray of her pelvis negative, COVID-19, elevated troponin possibly due to demand ischemia, HTN, HLD, COPD, Hyponatremia, h/o CVA, CKD III.    PT Comments    Pt received in bed and agreeable to PT. Pt on RW throughout session. Pt performed supine LE therex with min cuing for correct performance. Pt is progressing towards PT goals. Pt got EOB with improved technique requiring less physical assistance. Pt progressed to min guard assist to maintain sitting balance EOB. Pt requires B UE support and often reaches out to grab therapist for support. Pt worked on sitting balance ~5 balance working on correct posterior lean and maintaining proper posture. Pt performed x2 trials of STS with RW. Pt required heavy min A to power to upright standing requiring max multimodal cuing for anterior weight shift and knee extension to achieve proper standing posture. Pt quickly sitting back down EOB while working on posture. Agreeable to 2nd attempt at standing requiring min A to correct posterior lean with little carryover and recognition to posterior lean. Pt tolerated standing ~30 sec before sitting back down and requesting to return to bed. Min A to return to bed. Pt presents with decreased strength, ROM, balance, power and endurance and would benefit from further acute PT.    Follow Up Recommendations  SNF     Equipment Recommendations  None recommended by PT    Recommendations for Other Services       Precautions / Restrictions Precautions Precautions: Fall Restrictions Weight Bearing Restrictions: No    Mobility  Bed Mobility Overal bed mobility: Needs Assistance Bed Mobility: Rolling;Sidelying to Sit;Sit to Sidelying Rolling: Min guard Sidelying to sit: Min assist     Sit to sidelying: Min assist General bed mobility comments: min A to get EOB, pt frequently reaching out for therapist hand in order to pull herself forward  Transfers Overall transfer level: Needs assistance Equipment used: Rolling walker (2 wheeled) Transfers: Sit to/from Stand Sit to Stand: From elevated surface;Min assist         General transfer comment: min A from elevated bed x2 trials, cuing for hand placement and use of RW for both trials, pt stands with increased trunk and knee flexion not getting full extension, pt requires vc/tc for anterior weight shift, pt presents with strong posterior lean in standing, pt tolerated standing ~30 sec each trial before spontaneously deciding to sit back down  Ambulation/Gait             General Gait Details: pt refusing requesting to return to bed   Stairs             Wheelchair Mobility    Modified Rankin (Stroke Patients Only)       Balance Overall balance assessment: Needs assistance Sitting-balance support: Feet supported;Bilateral upper extremity supported Sitting balance-Leahy Scale: Fair Sitting balance - Comments: CGA to maintain sitting balance, pt likes to reach out for PTs hands to assist with balance   Standing balance support: Bilateral upper extremity supported;During functional activity Standing balance-Leahy Scale: Poor Standing balance comment: reliant on B UE support from RW, mod posterior lean  Cognition Arousal/Alertness: Lethargic Behavior During Therapy: Flat affect Overall Cognitive Status: No family/caregiver present to determine baseline cognitive functioning                                        Exercises Total Joint Exercises Ankle Circles/Pumps: AROM;Both;10 reps Quad  Sets: Strengthening;Both;10 reps Towel Squeeze: Strengthening;Both;5 reps Short Arc Quad: AROM;Both;10 reps Heel Slides: AAROM;Both;5 reps Hip ABduction/ADduction: AAROM;Both;10 reps Straight Leg Raises: AAROM;Both;10 reps    General Comments        Pertinent Vitals/Pain Pain Assessment: No/denies pain    Home Living                      Prior Function            PT Goals (current goals can now be found in the care plan section) Progress towards PT goals: Progressing toward goals    Frequency    Min 2X/week      PT Plan Current plan remains appropriate    Co-evaluation              AM-PAC PT "6 Clicks" Mobility   Outcome Measure  Help needed turning from your back to your side while in a flat bed without using bedrails?: A Little Help needed moving from lying on your back to sitting on the side of a flat bed without using bedrails?: A Little Help needed moving to and from a bed to a chair (including a wheelchair)?: A Lot Help needed standing up from a chair using your arms (e.g., wheelchair or bedside chair)?: A Lot Help needed to walk in hospital room?: Total Help needed climbing 3-5 steps with a railing? : Total 6 Click Score: 12    End of Session Equipment Utilized During Treatment: Gait belt Activity Tolerance: Patient limited by fatigue Patient left: in bed;with call bell/phone within reach;with bed alarm set Nurse Communication: Mobility status PT Visit Diagnosis: History of falling (Z91.81);Difficulty in walking, not elsewhere classified (R26.2);Muscle weakness (generalized) (M62.81)     Time: 1430-1501 PT Time Calculation (min) (ACUTE ONLY): 31 min  Charges:  $Therapeutic Exercise: 8-22 mins $Therapeutic Activity: 8-22 mins                     Yaw Escoto PT, DPT 3:19 PM,08/16/19 [phone]    Donna Quinn 08/16/2019, 3:14 PM

## 2019-08-17 LAB — COMPREHENSIVE METABOLIC PANEL
ALT: 21 U/L (ref 0–44)
AST: 31 U/L (ref 15–41)
Albumin: 2.6 g/dL — ABNORMAL LOW (ref 3.5–5.0)
Alkaline Phosphatase: 48 U/L (ref 38–126)
Anion gap: 10 (ref 5–15)
BUN: 19 mg/dL (ref 8–23)
CO2: 24 mmol/L (ref 22–32)
Calcium: 8 mg/dL — ABNORMAL LOW (ref 8.9–10.3)
Chloride: 98 mmol/L (ref 98–111)
Creatinine, Ser: 1.07 mg/dL — ABNORMAL HIGH (ref 0.44–1.00)
GFR calc Af Amer: 56 mL/min — ABNORMAL LOW (ref >60)
GFR calc non Af Amer: 49 mL/min — ABNORMAL LOW (ref >60)
Glucose, Bld: 85 mg/dL (ref 70–99)
Potassium: 4.2 mmol/L (ref 3.5–5.1)
Sodium: 132 mmol/L — ABNORMAL LOW (ref 135–145)
Total Bilirubin: 0.7 mg/dL (ref 0.3–1.2)
Total Protein: 5.8 g/dL — ABNORMAL LOW (ref 6.5–8.1)

## 2019-08-17 LAB — CBC WITH DIFFERENTIAL/PLATELET
Abs Immature Granulocytes: 0.03 10*3/uL (ref 0.00–0.07)
Basophils Absolute: 0 10*3/uL (ref 0.0–0.1)
Basophils Relative: 0 %
Eosinophils Absolute: 0 10*3/uL (ref 0.0–0.5)
Eosinophils Relative: 0 %
HCT: 28 % — ABNORMAL LOW (ref 36.0–46.0)
Hemoglobin: 9.3 g/dL — ABNORMAL LOW (ref 12.0–15.0)
Immature Granulocytes: 1 %
Lymphocytes Relative: 17 %
Lymphs Abs: 1 10*3/uL (ref 0.7–4.0)
MCH: 27 pg (ref 26.0–34.0)
MCHC: 33.2 g/dL (ref 30.0–36.0)
MCV: 81.2 fL (ref 80.0–100.0)
Monocytes Absolute: 0.5 10*3/uL (ref 0.1–1.0)
Monocytes Relative: 7 %
Neutro Abs: 4.7 10*3/uL (ref 1.7–7.7)
Neutrophils Relative %: 75 %
Platelets: 284 10*3/uL (ref 150–400)
RBC: 3.45 MIL/uL — ABNORMAL LOW (ref 3.87–5.11)
RDW: 14.6 % (ref 11.5–15.5)
WBC: 6.3 10*3/uL (ref 4.0–10.5)
nRBC: 0 % (ref 0.0–0.2)

## 2019-08-17 LAB — MAGNESIUM: Magnesium: 1.9 mg/dL (ref 1.7–2.4)

## 2019-08-17 LAB — FIBRIN DERIVATIVES D-DIMER (ARMC ONLY): Fibrin derivatives D-dimer (ARMC): 1251.99 ng/mL (FEU) — ABNORMAL HIGH (ref 0.00–499.00)

## 2019-08-17 LAB — C-REACTIVE PROTEIN: CRP: 8.9 mg/dL — ABNORMAL HIGH (ref <1.0)

## 2019-08-17 LAB — FERRITIN: Ferritin: 443 ng/mL — ABNORMAL HIGH (ref 11–307)

## 2019-08-17 MED ORDER — ENSURE ENLIVE PO LIQD
237.0000 mL | Freq: Two times a day (BID) | ORAL | Status: DC
  Administered 2019-08-17 – 2019-08-21 (×8): 237 mL via ORAL

## 2019-08-17 NOTE — Progress Notes (Addendum)
PROGRESS NOTE    Donna Quinn  HWT:[id number] DOB: 05-Sep-1935 DOA: 08/14/2019 PCP: Marguarite Arbour, MD    Assessment & Plan:   Principal Problem:   Fall Active Problems:   HTN (hypertension)   HLD (hyperlipidemia)   COPD (chronic obstructive pulmonary disease) (HCC)   GERD (gastroesophageal reflux disease)   Hyponatremia   Stroke (HCC)   COVID-19 virus infection   CKD (chronic kidney disease), stage IIIa   Elevated troponin   Debility    Donna Quinn is a 84 y.o. Caucasian female with medical history significant of hypertension, hyperlipidemia, COPD, stroke, GERD, anxiety, AAA, brain aneurysm CKD 3, who presents with generalized weakness fall.   Fall and weakness Patient complains of pelvic pain.  X-ray of her pelvis is negative.  CT-head no acute finding. --Lives alone and walks with walker at baseline. PLAN: -PT/OT rec SNF  COVID-19 virus PNA Patient does not have oxygen desaturation.  Chest x-ray showed "Right base infiltrate consistent pneumonia." -hold remdesivir and steroid since pt has no suppl O2 requirement. -Vitamin C and zinc  Mild Elevated trop 2/2 demand ischemia  Troponin I 65, flat.  No chest pain.  HTN (hypertension):  Blood pressure is elevated at 180/70 -continue home losartan -Continue home hydralazine -As needed hydralazine  HLD (hyperlipidemia): -lipitor  COPD (chronic obstructive pulmonary disease) (HCC): -Bronchodilators  GERD (gastroesophageal reflux disease): -PPI  Hyponatremia, chronic, stable --s/p MIVF --encourage PO hydration  Stroke [facility]): -continue plavix and lipitor  CKD (chronic kidney disease), stage IIIa: stable.  Cre 1.17   DVT prophylaxis: Lovenox SQ Code Status: Full code  Family Communication: updated daughter on the phone Disposition Plan: SNF when bed available   Subjective and Interval History:  Pt was more conversant today.  Reported feeling sleepy and coughing up yellow stuff.  No  fever, dyspnea, chest pain, abdominal pain, N/V/D.    Pt is now agreeable to SNF rehab.   Objective: Vitals:   08/16/19 2109 08/17/19 0006 08/17/19 0852 08/17/19 1257  BP: (!) 149/73 (!) 141/61 (!) 125/49   Pulse: 75 75 64 69  Resp:  18 17   Temp:  99.5 F (37.5 C) 98.3 F (36.8 C)   TempSrc:  Oral    SpO2: 93% 94% 93% 93%  Weight:      Height:        Intake/Output Summary (Last 24 hours) at 08/17/2019 1505 Last data filed at 08/17/2019 0900 Gross per 24 hour  Intake 100 ml  Output 750 ml  Net -650 ml   Filed Weights   08/14/19 0930 08/15/19 0628  Weight: 52.2 kg 52 kg    Examination:   Constitutional: NAD, alert, appeared brighter today, and cconversant HEENT: conjunctivae and lids normal, EOMI CV: RRR no M,R,G. Distal pulses +2.  No cyanosis.   RESP: CTA B/L over anterior, normal respiratory effort, on RA GI: +BS, NTND Extremities: No effusions, edema, or tenderness in BLE SKIN: warm, dry and intact Neuro: II - XII grossly intact.  Sensation intact   Data Reviewed: I have personally reviewed following labs and imaging studies  CBC: Recent Labs  Lab 08/14/19 0940 08/15/19 0130 08/16/19 0323 08/17/19 0826  WBC 4.9 4.1 5.3 6.3  NEUTROABS  --  3.1 4.2 4.7  HGB 10.5* 8.9* 9.1* 9.3*  HCT 31.9* 26.8* 28.2* 28.0*  MCV 82.2 82.0 82.5 81.2  PLT 246 217 245 284   Basic Metabolic Panel: Recent Labs  Lab 08/14/19 2003 08/15/19 0130 08/15/19 0833 08/16/19 0323 08/17/19  0826  NA 129* 130* 131* 131* 132*  K 4.3 4.2 4.4 4.1 4.2  CL 94* 97* 96* 98 98  CO2 27 25 24 23 24   GLUCOSE 86 73 78 70 85  BUN 25* 22 21 16 19   CREATININE 1.10* 1.01* 0.96 0.94 1.07*  CALCIUM 8.0* 7.9* 8.1* 8.0* 8.0*  MG  --  1.8  --  1.7 1.9   GFR: Estimated Creatinine Clearance: 33.8 mL/min (A) (by C-G formula based on SCr of 1.07 mg/dL (H)). Liver Function Tests: Recent Labs  Lab 08/15/19 0130 08/16/19 0323 08/17/19 0826  AST 39 34 31  ALT 26 22 21   ALKPHOS 53 48 48  BILITOT  0.6 0.8 0.7  PROT 5.6* 5.7* 5.8*  ALBUMIN 2.8* 2.7* 2.6*   No results for input(s): LIPASE, AMYLASE in the last 168 hours. No results for input(s): AMMONIA in the last 168 hours. Coagulation Profile: No results for input(s): INR, PROTIME in the last 168 hours. Cardiac Enzymes: No results for input(s): CKTOTAL, CKMB, CKMBINDEX, TROPONINI in the last 168 hours. BNP (last 3 results) No results for input(s): PROBNP in the last 8760 hours. HbA1C: Recent Labs    08/15/19 0130  HGBA1C 5.2   CBG: No results for input(s): GLUCAP in the last 168 hours. Lipid Profile: Recent Labs    08/14/19 1518 08/15/19 0130  CHOL  --  99  HDL  --  36*  LDLCALC  --  50  TRIG 46 64  CHOLHDL  --  2.8   Thyroid Function Tests: Recent Labs    08/15/19 0130  TSH 3.363   Anemia Panel: Recent Labs    08/16/19 0323 08/17/19 0826  FERRITIN 472* 443*   Sepsis Labs: Recent Labs  Lab 08/14/19 0940 08/14/19 1151 08/14/19 1518  PROCALCITON  --   --  0.11  LATICACIDVEN 1.0 1.1  --     No results found for this or any previous visit (from the past 240 hour(s)).    Radiology Studies: No results found.   Scheduled Meds: . amitriptyline  25 mg Oral QHS  . vitamin C  500 mg Oral Daily  . atorvastatin  40 mg Oral QPM  . clopidogrel  75 mg Oral BH-q7a  . dextromethorphan-guaiFENesin  1 tablet Oral BID  . DULoxetine  20 mg Oral Daily  . enoxaparin (LOVENOX) injection  40 mg Subcutaneous Q24H  . feeding supplement (ENSURE ENLIVE)  237 mL Oral BID BM  . gabapentin  300 mg Oral BID  . hydrALAZINE  100 mg Oral BID  . ipratropium  2 puff Inhalation Q6H  . losartan  50 mg Oral BID  . megestrol  400 mg Oral Daily  . pantoprazole  40 mg Oral Daily  . umeclidinium-vilanterol  1 puff Inhalation Daily  . vitamin B-12  1,000 mcg Oral Daily  . zinc sulfate  220 mg Oral Daily   Continuous Infusions:   LOS: 2 days     Enzo Bi, MD [facility] If 7PM-7AM, please contact  night-coverage 08/17/2019, 3:05 PM

## 2019-08-17 NOTE — TOC Progression Note (Signed)
Transition of Care [facility]) - Progression Note    Patient Details  Name: ZAIDY ABSHER MRN: [id number] Date of Birth: [birth year]  Transition of Care [facility]) CM/SW Contact  Trenton Founds, RN Phone Number: 08/17/2019, 11:50 AM  Clinical Narrative:     RNCM received call from unit secretary to report that patient's daughter had called in and now patient is agreeable to SNF placement. Returned call and spoke with Keene Breath, daughter who reports that after a conversation with MD she has spoken with her mother and she is now agreeable to SNF placement.  Did discuss with daughter and assure that she is aware this will mean placement outside of the county due to who is currently accepting Cov[place] patients. Synetta Fail reports she does realize this and family is understanding as well as the fact that they are unable to visit at this time anyway. RNCM will initiate FL2 and bed search.     Expected Discharge Plan: Home w Home Health Services Barriers to Discharge: Continued Medical Work up  Expected Discharge Plan and Services Expected Discharge Plan: Home w Home Health Services   Discharge Planning Services: CM Consult   Living arrangements for the past 2 months: Single Family Home Expected Discharge Date: 08/15/19                                     Social Determinants of Health (SDOH) Interventions    Readmission Risk Interventions No flowsheet data found.

## 2019-08-17 NOTE — TOC Progression Note (Signed)
Transition of Care [facility]) - Progression Note    Patient Details  Name: Donna Quinn MRN: [id number] Date of Birth: November 17, 1935  [facility]) CM/SW Contact  Trenton Founds, RN Phone Number: 08/17/2019, 2:49 PM  Clinical Narrative:     RNCM placed call to patient's daughter regarding bed offer from [facility]. Spoke with daughter Synetta Fail and she was agreeable to this plan. Accepted bed in hub and placed call to [facility], [facility] representative and informed him of same. He at that time informed this CM that they would not likely have a bed until the first of the week. This CM questioned  as to why they are offering if the don't have it and he said that they term it is as pending bed availability although this verbiage wasn't seen in the chart and that he has questioned his leadership about this before.  RNCM reached out to Kathi Der, [facility] manager regarding this via email and she reported she would reach out to her contacts as well.      Expected Discharge Plan: Home w Home Health Services Barriers to Discharge: Continued Medical Work up  Expected Discharge Plan and Services Expected Discharge Plan: Home w Home Health Services   Discharge Planning Services: CM Consult   Living arrangements for the past 2 months: Single Family Home Expected Discharge Date: 08/15/19                                     Social Determinants of Health (SDOH) Interventions    Readmission Risk Interventions No flowsheet data found.

## 2019-08-17 NOTE — Care Management Important Message (Signed)
Important Message  Patient Details  Name: Donna Quinn MRN: [id number] Date of Birth: [birth year]   Medicare Important Message Given:  Yes   Information provided to daughter.      Trenton Founds, RN 08/17/2019, 3:46 PM

## 2019-08-17 NOTE — [place] FL2 (Signed)
[place] MEDICAID FL2 LEVEL OF CARE SCREENING TOOL     IDENTIFICATION  Patient Name: Donna Quinn Birthdate: 16-Aug-1935 Sex: female Admission Date (Current Location): 08/14/2019  [place] and [place] Number:  Chiropodist and Address:  [facility], [address] [place], [place] [address]      Provider Number: [id number]  Attending Physician Name and Address:  Darlin Priestly, MD  Relative Name and Phone Number:  Keene Breath [phone]    Current Level of Care: Hospital Recommended Level of Care: Skilled Nursing Facility Prior Approval Number:    Date Approved/Denied:   PASRR Number: [id number]  Discharge Plan: SNF    Current Diagnoses: Patient Active Problem List   Diagnosis Date Noted  . Debility 08/15/2019  . Elevated troponin 08/14/2019  . Hypokalemia 08/05/2018  . COPD with exacerbation (HCC) 08/05/2018  . Hyponatremia 05/03/2018  . Dehydration 05/02/2018  . Fracture of femoral neck, right (HCC) 10/19/2016  . HTN (hypertension) 10/19/2016  . HLD (hyperlipidemia) 10/19/2016  . COPD (chronic obstructive pulmonary disease) (HCC) 10/19/2016  . GERD (gastroesophageal reflux disease) 10/19/2016  . COPD exacerbation (HCC) 08/06/2016  . Stroke (HCC) 2016  . COVID-19 virus infection 2016  . Fall 2016  . CKD (chronic kidney disease), stage IIIa 2016    Orientation RESPIRATION BLADDER Height & Weight     Self, Time, Situation, Place  Normal External catheter Weight: 52 kg Height:  5\' 5"  (165.1 cm)  BEHAVIORAL SYMPTOMS/MOOD NEUROLOGICAL BOWEL NUTRITION STATUS      Continent Diet(Heart Healthy)  AMBULATORY STATUS COMMUNICATION OF NEEDS Skin   Supervision Verbally Normal                       Personal Care Assistance Level of Assistance  Bathing, Dressing Bathing Assistance: Limited assistance   Dressing Assistance: Limited assistance     Functional Limitations Info             SPECIAL CARE FACTORS FREQUENCY  PT  (By licensed PT), OT (By licensed OT)                    Contractures Contractures Info: Not present    Additional Factors Info  Code Status, Allergies, Isolation Precautions Code Status Info: Full Allergies Info: Aggrenox, Augmentin, PCN, Remeron, Sulfa     Isolation Precautions Info: Airborne/Contact     Current Medications (08/17/2019):  This is the current hospital active medication list Current Facility-Administered Medications  Medication Dose Route Frequency Provider Last Rate Last Admin  . acetaminophen (TYLENOL) tablet 650 mg  650 mg Oral Q6H PRN 10/15/2019, MD      . albuterol (VENTOLIN HFA) 108 (90 Base) MCG/ACT inhaler 2 puff  2 puff Inhalation Q4H PRN Lorretta Harp, MD   2 puff at 08/16/19 2123  . amitriptyline (ELAVIL) tablet 25 mg  25 mg Oral QHS 2124, MD   25 mg at 08/16/19 2111  . ascorbic acid (VITAMIN C) tablet 500 mg  500 mg Oral Daily 2112, MD   500 mg at 08/17/19 10/15/19  . atorvastatin (LIPITOR) tablet 40 mg  40 mg Oral QPM 8546, MD   40 mg at 08/16/19 1835  . clopidogrel (PLAVIX) tablet 75 mg  75 mg Oral 10/14/19, MD   75 mg at 08/17/19 [phone]  . dextromethorphan-guaiFENesin (MUCINEX DM) 30-600 MG per 12 hr tablet 1 tablet  1 tablet Oral BID 2703, MD   1 tablet at 08/17/19  1610  . DULoxetine (CYMBALTA) DR capsule 20 mg  20 mg Oral Daily Ivor Costa, MD   20 mg at 08/17/19 9604  . enoxaparin (LOVENOX) injection 40 mg  40 mg Subcutaneous Q24H Ivor Costa, MD   40 mg at 08/17/19 0931  . gabapentin (NEURONTIN) capsule 300 mg  300 mg Oral BID Ivor Costa, MD   300 mg at 08/17/19 5409  . hydrALAZINE (APRESOLINE) tablet 100 mg  100 mg Oral BID Ivor Costa, MD   100 mg at 08/17/19 8119  . hydrALAZINE (APRESOLINE) tablet 25 mg  25 mg Oral TID PRN Ivor Costa, MD      . ipratropium (ATROVENT HFA) inhaler 2 puff  2 puff Inhalation Q6H Ivor Costa, MD   2 puff at 08/17/19 0929  . LORazepam (ATIVAN) tablet 1 mg  1 mg Oral QHS PRN Ivor Costa, MD      .  losartan (COZAAR) tablet 50 mg  50 mg Oral BID Enzo Bi, MD   50 mg at 08/17/19 1478  . megestrol (MEGACE) 400 MG/10ML suspension 400 mg  400 mg Oral Daily Ivor Costa, MD   400 mg at 08/17/19 2956  . ondansetron (ZOFRAN) injection 4 mg  4 mg Intravenous Q8H PRN Ivor Costa, MD      . oxyCODONE-acetaminophen (PERCOCET/ROXICET) 5-325 MG per tablet 1 tablet  1 tablet Oral Q4H PRN Ivor Costa, MD   1 tablet at 08/15/19 2005  . pantoprazole (PROTONIX) EC tablet 40 mg  40 mg Oral Daily Ivor Costa, MD   40 mg at 08/17/19 2130  . senna-docusate (Senokot-S) tablet 2 tablet  2 tablet Oral Daily PRN Ivor Costa, MD      . umeclidinium-vilanterol (ANORO ELLIPTA) 62.5-25 MCG/INH 1 puff  1 puff Inhalation Daily Ivor Costa, MD   1 puff at 08/17/19 [phone]  . vitamin B-12 (CYANOCOBALAMIN) tablet 1,000 mcg  1,000 mcg Oral Daily Ivor Costa, MD   1,000 mcg at 08/17/19 8469  . zinc sulfate capsule 220 mg  220 mg Oral Daily Ivor Costa, MD   220 mg at 08/17/19 6295     Discharge Medications: Please see discharge summary for a list of discharge medications.  Relevant Imaging Results:  Relevant Lab Results:   Additional Information    Shelbie Ammons, RN

## 2019-08-17 NOTE — Progress Notes (Signed)
Occupational Therapy Treatment Patient Details Name: Donna Quinn MRN: [id number] DOB: June 15, 1936 Today's Date: 08/17/2019    History of present illness Per MD note: Pt is an 84 y.o. female with medical history significant of hypertension, hyperlipidemia, COPD, stroke, GERD, anxiety, AAA, brain aneurysm CKD 3, who presents with generalized weakness fall.  MD assessment includes: pelvic pain with X-ray of her pelvis negative, COVID-19, elevated troponin possibly due to demand ischemia, HTN, HLD, COPD, Hyponatremia, h/o CVA, CKD III.   OT comments  Pt seen for OT tx this date. PT denies pain, SOB at start of session. Pt with partially finished lunch tray, stating she was having difficulty chewing 2/2 her dentures not staying in place. No denture cream in unit's clean supply. Spoke with Licensed conveyancer and RN who called to have some sent. Pt required set up and supervision to clean her dentures with toothbrush and toothpaste, ultimately requiring Min A to complete for thoroughness. Pt reporting mild SOB afterwards. HR 69, O2 93% on room air. Pt instructed in pursed lip breathing to support breath recovery. Pt attempted, but unable to perform without coughing. Pt continues to benefit from skilled OT services. Continue to recommend SNF at discharge to support return to PLOF.   Follow Up Recommendations  SNF    Equipment Recommendations  3 in 1 bedside commode    Recommendations for Other Services      Precautions / Restrictions Precautions Precautions: Fall Restrictions Weight Bearing Restrictions: No Other Position/Activity Restrictions: Keep SpO2 > 92%       Mobility Bed Mobility                  Transfers                      Balance                                           ADL either performed or assessed with clinical judgement   ADL Overall ADL's : Needs assistance/impaired     Grooming: Oral care;Bed level;Set up;Minimal  assistance;Supervision/safety Grooming Details (indicate cue type and reason): Pt required set up and supervision for denture care to clean then requiring additional time/effort to perform and ultimately requiring Min A to complete for thoroughness; pt reporting fatigue after performing                                     Vision Baseline Vision/History: No visual deficits Patient Visual Report: No change from baseline     Perception     Praxis      Cognition Arousal/Alertness: Awake/alert Behavior During Therapy: WFL for tasks assessed/performed Overall Cognitive Status: No family/caregiver present to determine baseline cognitive functioning                                 General Comments: pt slow to answer questions (HOH?) but follows commands with additional time        Exercises Other Exercises Other Exercises: Pt educated in pursed lip breathing to support breath recovery after exertion, pt trialed   Shoulder Instructions       General Comments      Pertinent Vitals/ Pain       Pain Assessment:  No/denies pain  Home Living                                          Prior Functioning/Environment              Frequency  Min 1X/week        Progress Toward Goals  OT Goals(current goals can now be found in the care plan section)  Progress towards OT goals: Progressing toward goals  Acute Rehab OT Goals Patient Stated Goal: To get stronger OT Goal Formulation: With patient Time For Goal Achievement: 08/29/19 Potential to Achieve Goals: Good  Plan Discharge plan remains appropriate;Frequency remains appropriate    Co-evaluation                 AM-PAC OT "6 Clicks" Daily Activity     Outcome Measure   Help from another person eating meals?: A Little Help from another person taking care of personal grooming?: A Little Help from another person toileting, which includes using toliet, bedpan, or  urinal?: A Lot Help from another person bathing (including washing, rinsing, drying)?: A Lot Help from another person to put on and taking off regular upper body clothing?: A Lot Help from another person to put on and taking off regular lower body clothing?: A Lot 6 Click Score: 14    End of Session    OT Visit Diagnosis: Muscle weakness (generalized) (M62.81);Other abnormalities of gait and mobility (R26.89)   Activity Tolerance Patient tolerated treatment well   Patient Left in bed;with call bell/phone within reach;with bed alarm set   Nurse Communication          Time: 6967-8938 OT Time Calculation (min): 19 min  Charges: OT General Charges $OT Visit: 1 Visit OT Treatments $Self Care/Home Management : 8-22 mins  Jeni Salles, MPH, MS, OTR/L ascom [phone] 08/17/19, 1:03 PM

## 2019-08-17 NOTE — Progress Notes (Signed)
Physical Therapy Treatment Patient Details Name: Donna Quinn MRN: [id number] DOB: 1936/02/11 Today's Date: 08/17/2019    History of Present Illness Per MD note: Pt is an 84 y.o. female with medical history significant of hypertension, hyperlipidemia, COPD, stroke, GERD, anxiety, AAA, brain aneurysm CKD 3, who presents with generalized weakness fall.  MD assessment includes: pelvic pain with X-ray of her pelvis negative, COVID-19, elevated troponin possibly due to demand ischemia, HTN, HLD, COPD, Hyponatremia, h/o CVA, CKD III.    PT Comments    Pt presented with deficits in strength, transfers, mobility, gait, balance, and activity tolerance.  Pt required increased assistance with both bed mobility tasks and transfers this session and once in standing was unable to advance either LE during multiple attempts at ambulation.  Pt participated actively during the session with no adverse symptoms reported and with SpO2 > 92% throughout.  Pt will benefit from PT services in a SNF setting upon discharge to safely address above deficits for decreased caregiver assistance and eventual return to PLOF.     Follow Up Recommendations  SNF     Equipment Recommendations  None recommended by PT    Recommendations for Other Services       Precautions / Restrictions Precautions Precautions: Fall Restrictions Weight Bearing Restrictions: No Other Position/Activity Restrictions: Keep SpO2 > 92%    Mobility  Bed Mobility Overal bed mobility: Needs Assistance Bed Mobility: Rolling;Sidelying to Sit;Sit to Sidelying Rolling: Min assist Sidelying to sit: Mod assist     Sit to sidelying: Mod assist General bed mobility comments: Mod A for BLE and trunk control  Transfers Overall transfer level: Needs assistance Equipment used: Rolling walker (2 wheeled) Transfers: Sit to/from Stand Sit to Stand: From elevated surface;Mod assist         General transfer comment: Min verbal cues for  sequencing and Mod A to come to standing and to prevent posterior LOB  Ambulation/Gait             General Gait Details: Unable to advance either LE during amb attempt   Stairs             Wheelchair Mobility    Modified Rankin (Stroke Patients Only)       Balance Overall balance assessment: Needs assistance Sitting-balance support: Feet supported;Bilateral upper extremity supported Sitting balance-Leahy Scale: Fair       Standing balance-Leahy Scale: Poor Standing balance comment: Mod A for stability in standing                            Cognition Arousal/Alertness: Awake/alert Behavior During Therapy: WFL for tasks assessed/performed Overall Cognitive Status: No family/caregiver present to determine baseline cognitive functioning                                        Exercises Total Joint Exercises Ankle Circles/Pumps: AROM;Both;10 reps;15 reps Quad Sets: Strengthening;Both;10 reps;15 reps Gluteal Sets: Strengthening;Both;10 reps Heel Slides: AAROM;Both;10 reps Hip ABduction/ADduction: AAROM;Both;10 reps Straight Leg Raises: AAROM;Both;10 reps Long Arc Quad: Strengthening;Both;10 reps;15 reps Knee Flexion: Strengthening;Both;10 reps;15 reps Other Exercises Other Exercises: Static and dynamic sitting at the EOB with reaching outside BOS    General Comments        Pertinent Vitals/Pain Pain Assessment: No/denies pain    Home Living  Prior Function            PT Goals (current goals can now be found in the care plan section) Progress towards PT goals: Not progressing toward goals - comment(Pt limited by general functional weakness)    Frequency    Min 2X/week      PT Plan Current plan remains appropriate    Co-evaluation              AM-PAC PT "6 Clicks" Mobility   Outcome Measure  Help needed turning from your back to your side while in a flat bed without using  bedrails?: A Lot Help needed moving from lying on your back to sitting on the side of a flat bed without using bedrails?: A Lot Help needed moving to and from a bed to a chair (including a wheelchair)?: A Lot Help needed standing up from a chair using your arms (e.g., wheelchair or bedside chair)?: A Lot Help needed to walk in hospital room?: Total Help needed climbing 3-5 steps with a railing? : Total 6 Click Score: 10    End of Session Equipment Utilized During Treatment: Gait belt Activity Tolerance: Patient tolerated treatment well Patient left: in bed;with call bell/phone within reach;with bed alarm set Nurse Communication: Mobility status PT Visit Diagnosis: History of falling (Z91.81);Difficulty in walking, not elsewhere classified (R26.2);Muscle weakness (generalized) (M62.81)     Time: 1017-5102 PT Time Calculation (min) (ACUTE ONLY): 35 min  Charges:  $Therapeutic Exercise: 8-22 mins $Therapeutic Activity: 8-22 mins                     D. Scott Jahfari Ambers PT, DPT 08/17/19, 5:29 PM

## 2019-08-18 ENCOUNTER — Other Ambulatory Visit: Payer: Self-pay

## 2019-08-18 LAB — CBC WITH DIFFERENTIAL/PLATELET
Abs Immature Granulocytes: 0.04 10*3/uL (ref 0.00–0.07)
Basophils Absolute: 0 10*3/uL (ref 0.0–0.1)
Basophils Relative: 0 %
Eosinophils Absolute: 0 10*3/uL (ref 0.0–0.5)
Eosinophils Relative: 0 %
HCT: 25.6 % — ABNORMAL LOW (ref 36.0–46.0)
Hemoglobin: 8.5 g/dL — ABNORMAL LOW (ref 12.0–15.0)
Immature Granulocytes: 1 %
Lymphocytes Relative: 12 %
Lymphs Abs: 0.7 10*3/uL (ref 0.7–4.0)
MCH: 26.9 pg (ref 26.0–34.0)
MCHC: 33.2 g/dL (ref 30.0–36.0)
MCV: 81 fL (ref 80.0–100.0)
Monocytes Absolute: 0.4 10*3/uL (ref 0.1–1.0)
Monocytes Relative: 7 %
Neutro Abs: 4.9 10*3/uL (ref 1.7–7.7)
Neutrophils Relative %: 80 %
Platelets: 285 10*3/uL (ref 150–400)
RBC: 3.16 MIL/uL — ABNORMAL LOW (ref 3.87–5.11)
RDW: 14.5 % (ref 11.5–15.5)
WBC: 6.1 10*3/uL (ref 4.0–10.5)
nRBC: 0 % (ref 0.0–0.2)

## 2019-08-18 LAB — C-REACTIVE PROTEIN: CRP: 7.9 mg/dL — ABNORMAL HIGH (ref <1.0)

## 2019-08-18 LAB — COMPREHENSIVE METABOLIC PANEL
ALT: 21 U/L (ref 0–44)
AST: 27 U/L (ref 15–41)
Albumin: 2.4 g/dL — ABNORMAL LOW (ref 3.5–5.0)
Alkaline Phosphatase: 43 U/L (ref 38–126)
Anion gap: 8 (ref 5–15)
BUN: 18 mg/dL (ref 8–23)
CO2: 24 mmol/L (ref 22–32)
Calcium: 7.8 mg/dL — ABNORMAL LOW (ref 8.9–10.3)
Chloride: 98 mmol/L (ref 98–111)
Creatinine, Ser: 1.03 mg/dL — ABNORMAL HIGH (ref 0.44–1.00)
GFR calc Af Amer: 59 mL/min — ABNORMAL LOW (ref >60)
GFR calc non Af Amer: 51 mL/min — ABNORMAL LOW (ref >60)
Glucose, Bld: 86 mg/dL (ref 70–99)
Potassium: 4.3 mmol/L (ref 3.5–5.1)
Sodium: 130 mmol/L — ABNORMAL LOW (ref 135–145)
Total Bilirubin: 0.5 mg/dL (ref 0.3–1.2)
Total Protein: 5.4 g/dL — ABNORMAL LOW (ref 6.5–8.1)

## 2019-08-18 LAB — FERRITIN: Ferritin: 373 ng/mL — ABNORMAL HIGH (ref 11–307)

## 2019-08-18 LAB — FIBRIN DERIVATIVES D-DIMER (ARMC ONLY): Fibrin derivatives D-dimer (ARMC): 1152.81 ng/mL (FEU) — ABNORMAL HIGH (ref 0.00–499.00)

## 2019-08-18 LAB — MAGNESIUM: Magnesium: 1.8 mg/dL (ref 1.7–2.4)

## 2019-08-18 MED ORDER — AEROCHAMBER PLUS FLO-VU LARGE MISC
1.0000 | Freq: Once | Status: DC
  Filled 2019-08-18: qty 1

## 2019-08-18 NOTE — Progress Notes (Signed)
Pt daughter Keene Breath contacted @ [phone]. Updated on pt status, all questions addressed at time of call.

## 2019-08-18 NOTE — Progress Notes (Signed)
PROGRESS NOTE    Donna Quinn  WUJ:[id number] DOB: 28-Feb-1936 DOA: 08/14/2019 PCP: Idelle Crouch, MD    Assessment & Plan:   Principal Problem:   Fall Active Problems:   HTN (hypertension)   HLD (hyperlipidemia)   COPD (chronic obstructive pulmonary disease) ([place])   GERD (gastroesophageal reflux disease)   Hyponatremia   Stroke ([place])   COVID-19 virus infection   CKD (chronic kidney disease), stage IIIa   Elevated troponin   Debility    CAPRISHA Quinn is a 84 y.o. Caucasian female with medical history significant of hypertension, hyperlipidemia, COPD, stroke, GERD, anxiety, AAA, brain aneurysm CKD 3, who presents with generalized weakness fall.   Fall and weakness Patient complains of pelvic pain.  X-ray of her pelvis is negative.  CT-head no acute finding. --Lives alone and walks with walker at baseline. PLAN: -PT/OT rec SNF  COVID-19 virus PNA Patient does not have oxygen desaturation.  Chest x-ray showed "Right base infiltrate consistent pneumonia." -hold remdesivir and steroid since pt has no suppl O2 requirement. -Vitamin C and zinc  Mild Elevated trop 2/2 demand ischemia  Troponin I 65, flat.  No chest pain.  HTN (hypertension):  Blood pressure is elevated at 180/70 -continue home losartan -Continue home hydralazine -As needed hydralazine  HLD (hyperlipidemia): -lipitor  COPD (chronic obstructive pulmonary disease) (HCC): -Bronchodilators  GERD (gastroesophageal reflux disease): -PPI  Hyponatremia, chronic, stable --s/p MIVF --encourage PO hydration  Stroke [facility]): -continue plavix and lipitor  CKD (chronic kidney disease), stage IIIa: stable.  Cre 1.17   DVT prophylaxis: Lovenox SQ Code Status: Full code  Disposition Plan: SNF when bed available   Subjective and Interval History:  Pt is drinking fluids, but not hungry and not eating much.  No fever, dyspnea, chest pain, abdominal pain, N/V/D, dysuria, increased swelling.     Objective: Vitals:   08/17/19 2306 08/18/19 0756 08/18/19 1030 08/18/19 1030  BP: (!) 114/55 (!) 109/54 (!) 122/52 (!) 122/52  Pulse: 63 64  64  Resp: 15 20    Temp: 98.6 F (37 C) 98.7 F (37.1 C)    TempSrc:      SpO2: 93% 92%    Weight:      Height:        Intake/Output Summary (Last 24 hours) at 08/18/2019 1457 Last data filed at 08/17/2019 2124 Gross per 24 hour  Intake 120 ml  Output -  Net 120 ml   Filed Weights   08/14/19 0930 08/15/19 0628  Weight: 52.2 kg 52 kg    Examination:   Constitutional: NAD, alert, and cconversant HEENT: conjunctivae and lids normal, EOMI CV: RRR no M,R,G. Distal pulses +2.  No cyanosis.   RESP: CTA B/L over anterior, normal respiratory effort, on RA GI: +BS, NTND Extremities: No effusions, edema, or tenderness in BLE SKIN: warm, dry and intact Neuro: II - XII grossly intact.  Sensation intact   Data Reviewed: I have personally reviewed following labs and imaging studies  CBC: Recent Labs  Lab 08/14/19 0940 08/15/19 0130 08/16/19 0323 08/17/19 0826 08/18/19 0455  WBC 4.9 4.1 5.3 6.3 6.1  NEUTROABS  --  3.1 4.2 4.7 4.9  HGB 10.5* 8.9* 9.1* 9.3* 8.5*  HCT 31.9* 26.8* 28.2* 28.0* 25.6*  MCV 82.2 82.0 82.5 81.2 81.0  PLT 246 217 245 284 956   Basic Metabolic Panel: Recent Labs  Lab 08/15/19 0130 08/15/19 0833 08/16/19 0323 08/17/19 0826 08/18/19 0455  NA 130* 131* 131* 132* 130*  K 4.2  4.4 4.1 4.2 4.3  CL 97* 96* 98 98 98  CO2 25 24 23 24 24   GLUCOSE 73 78 70 85 86  BUN 22 21 16 19 18   CREATININE 1.01* 0.96 0.94 1.07* 1.03*  CALCIUM 7.9* 8.1* 8.0* 8.0* 7.8*  MG 1.8  --  1.7 1.9 1.8   GFR: Estimated Creatinine Clearance: 35.2 mL/min (A) (by C-G formula based on SCr of 1.03 mg/dL (H)). Liver Function Tests: Recent Labs  Lab 08/15/19 0130 08/16/19 0323 08/17/19 0826 08/18/19 0455  AST 39 34 31 27  ALT 26 22 21 21   ALKPHOS 53 48 48 43  BILITOT 0.6 0.8 0.7 0.5  PROT 5.6* 5.7* 5.8* 5.4*  ALBUMIN 2.8*  2.7* 2.6* 2.4*   No results for input(s): LIPASE, AMYLASE in the last 168 hours. No results for input(s): AMMONIA in the last 168 hours. Coagulation Profile: No results for input(s): INR, PROTIME in the last 168 hours. Cardiac Enzymes: No results for input(s): CKTOTAL, CKMB, CKMBINDEX, TROPONINI in the last 168 hours. BNP (last 3 results) No results for input(s): PROBNP in the last 8760 hours. HbA1C: No results for input(s): HGBA1C in the last 72 hours. CBG: No results for input(s): GLUCAP in the last 168 hours. Lipid Profile: No results for input(s): CHOL, HDL, LDLCALC, TRIG, CHOLHDL, LDLDIRECT in the last 72 hours. Thyroid Function Tests: No results for input(s): TSH, T4TOTAL, FREET4, T3FREE, THYROIDAB in the last 72 hours. Anemia Panel: Recent Labs    08/17/19 0826 08/18/19 0455  FERRITIN 443* 373*   Sepsis Labs: Recent Labs  Lab 08/14/19 0940 08/14/19 1151 08/14/19 1518  PROCALCITON  --   --  0.11  LATICACIDVEN 1.0 1.1  --     No results found for this or any previous visit (from the past 240 hour(s)).    Radiology Studies: No results found.   Scheduled Meds: . amitriptyline  25 mg Oral QHS  . vitamin C  500 mg Oral Daily  . atorvastatin  40 mg Oral QPM  . clopidogrel  75 mg Oral BH-q7a  . dextromethorphan-guaiFENesin  1 tablet Oral BID  . DULoxetine  20 mg Oral Daily  . enoxaparin (LOVENOX) injection  40 mg Subcutaneous Q24H  . feeding supplement (ENSURE ENLIVE)  237 mL Oral BID BM  . gabapentin  300 mg Oral BID  . hydrALAZINE  100 mg Oral BID  . ipratropium  2 puff Inhalation Q6H  . losartan  50 mg Oral BID  . megestrol  400 mg Oral Daily  . pantoprazole  40 mg Oral Daily  . umeclidinium-vilanterol  1 puff Inhalation Daily  . vitamin B-12  1,000 mcg Oral Daily  . zinc sulfate  220 mg Oral Daily   Continuous Infusions:   LOS: 3 days     10/12/19, MD [facility] If 7PM-7AM, please contact night-coverage 08/18/2019, 2:57 PM

## 2019-08-19 ENCOUNTER — Other Ambulatory Visit: Payer: Self-pay

## 2019-08-19 NOTE — Progress Notes (Signed)
Patient noted to have difficulty urinating after attempts to use the bedpan and bedside commode. Order received to perform In & out cath.  750 ml's of amber colored urine obtained via straight cath. Procedure completed without difficulty. Patient tolerated well.

## 2019-08-19 NOTE — Progress Notes (Signed)
PROGRESS NOTE    Donna Quinn  ZOX:[id number] DOB: [birth year] DOA: 08/14/2019 PCP: Marguarite Arbour, MD    Assessment & Plan:   Principal Problem:   Fall Active Problems:   HTN (hypertension)   HLD (hyperlipidemia)   COPD (chronic obstructive pulmonary disease) (HCC)   GERD (gastroesophageal reflux disease)   Hyponatremia   Stroke (HCC)   COVID-19 virus infection   CKD (chronic kidney disease), stage IIIa   Elevated troponin   Debility    Donna Quinn is a 84 y.o. Caucasian female with medical history significant of hypertension, hyperlipidemia, COPD, stroke, GERD, anxiety, AAA, brain aneurysm CKD 3, who presents with generalized weakness fall.   Fall and weakness Patient complains of pelvic pain.  X-ray of her pelvis is negative.  CT-head no acute finding. --Lives alone and walks with walker at baseline. PLAN: -PT/OT rec SNF --up to chair  COVID-19 virus PNA Patient does not have oxygen desaturation.  Chest x-ray showed "Right base infiltrate consistent pneumonia." -hold remdesivir and steroid since pt has no suppl O2 requirement. -Vitamin C and zinc  Mild Elevated trop 2/2 demand ischemia  Troponin I 65, flat.  No chest pain.  HTN (hypertension):  Blood pressure is elevated at 180/70 -continue home losartan -Continue home hydralazine -As needed hydralazine  HLD (hyperlipidemia): -lipitor  COPD (chronic obstructive pulmonary disease) (HCC): -Bronchodilators  GERD (gastroesophageal reflux disease): -PPI  Hyponatremia, chronic, stable --s/p MIVF --encourage PO hydration  Stroke [facility]): -continue plavix and lipitor  CKD (chronic kidney disease), stage IIIa: stable.  Cre 1.17   DVT prophylaxis: Lovenox SQ Code Status: Full code  Disposition Plan: SNF when bed available   Subjective and Interval History:  Overnight was found to have difficulty urinating and required I/O cath once.    No complaints today.  No fever, dyspnea, chest  pain, abdominal pain, N/V/D.  Has not been out of bed.   Objective: Vitals:   08/18/19 2335 08/19/19 0738 08/19/19 0740 08/19/19 1553  BP: (!) 105/48  (!) 104/48 103/75  Pulse: 63  67 71  Resp: 18  18 18   Temp: 98.6 F (37 C) 98 F (36.7 C)  98.6 F (37 C)  TempSrc:  Oral    SpO2: 95%  94% 94%  Weight:      Height:        Intake/Output Summary (Last 24 hours) at 08/19/2019 1811 Last data filed at 08/19/2019 0640 Gross per 24 hour  Intake 100 ml  Output 1500 ml  Net -1400 ml   Filed Weights   08/14/19 0930 08/15/19 0628  Weight: 52.2 kg 52 kg    Examination:   Constitutional: NAD, alert, and cconversant HEENT: conjunctivae and lids normal, EOMI CV: RRR no M,R,G. Distal pulses +2.  No cyanosis.   RESP: CTA B/L over anterior, normal respiratory effort, on RA GI: +BS, NTND Extremities: No effusions, edema, or tenderness in BLE SKIN: warm, dry and intact Neuro: II - XII grossly intact.  Sensation intact   Data Reviewed: I have personally reviewed following labs and imaging studies  CBC: Recent Labs  Lab 08/14/19 0940 08/15/19 0130 08/16/19 0323 08/17/19 0826 08/18/19 0455  WBC 4.9 4.1 5.3 6.3 6.1  NEUTROABS  --  3.1 4.2 4.7 4.9  HGB 10.5* 8.9* 9.1* 9.3* 8.5*  HCT 31.9* 26.8* 28.2* 28.0* 25.6*  MCV 82.2 82.0 82.5 81.2 81.0  PLT 246 217 245 284 285   Basic Metabolic Panel: Recent Labs  Lab 08/15/19 0130 08/15/19 0833 08/16/19  3716 08/17/19 0826 08/18/19 0455  NA 130* 131* 131* 132* 130*  K 4.2 4.4 4.1 4.2 4.3  CL 97* 96* 98 98 98  CO2 25 24 23 24 24   GLUCOSE 73 78 70 85 86  BUN 22 21 16 19 18   CREATININE 1.01* 0.96 0.94 1.07* 1.03*  CALCIUM 7.9* 8.1* 8.0* 8.0* 7.8*  MG 1.8  --  1.7 1.9 1.8   GFR: Estimated Creatinine Clearance: 35.2 mL/min (A) (by C-G formula based on SCr of 1.03 mg/dL (H)). Liver Function Tests: Recent Labs  Lab 08/15/19 0130 08/16/19 0323 08/17/19 0826 08/18/19 0455  AST 39 34 31 27  ALT 26 22 21 21   ALKPHOS 53 48 48  43  BILITOT 0.6 0.8 0.7 0.5  PROT 5.6* 5.7* 5.8* 5.4*  ALBUMIN 2.8* 2.7* 2.6* 2.4*   No results for input(s): LIPASE, AMYLASE in the last 168 hours. No results for input(s): AMMONIA in the last 168 hours. Coagulation Profile: No results for input(s): INR, PROTIME in the last 168 hours. Cardiac Enzymes: No results for input(s): CKTOTAL, CKMB, CKMBINDEX, TROPONINI in the last 168 hours. BNP (last 3 results) No results for input(s): PROBNP in the last 8760 hours. HbA1C: No results for input(s): HGBA1C in the last 72 hours. CBG: No results for input(s): GLUCAP in the last 168 hours. Lipid Profile: No results for input(s): CHOL, HDL, LDLCALC, TRIG, CHOLHDL, LDLDIRECT in the last 72 hours. Thyroid Function Tests: No results for input(s): TSH, T4TOTAL, FREET4, T3FREE, THYROIDAB in the last 72 hours. Anemia Panel: Recent Labs    08/17/19 0826 08/18/19 0455  FERRITIN 443* 373*   Sepsis Labs: Recent Labs  Lab 08/14/19 0940 08/14/19 1151 08/14/19 1518  PROCALCITON  --   --  0.11  LATICACIDVEN 1.0 1.1  --     No results found for this or any previous visit (from the past 240 hour(s)).    Radiology Studies: No results found.   Scheduled Meds: . AeroChamber Plus Flo-Vu Large  1 each Other Once  . amitriptyline  25 mg Oral QHS  . vitamin C  500 mg Oral Daily  . atorvastatin  40 mg Oral QPM  . clopidogrel  75 mg Oral BH-q7a  . dextromethorphan-guaiFENesin  1 tablet Oral BID  . DULoxetine  20 mg Oral Daily  . enoxaparin (LOVENOX) injection  40 mg Subcutaneous Q24H  . feeding supplement (ENSURE ENLIVE)  237 mL Oral BID BM  . gabapentin  300 mg Oral BID  . hydrALAZINE  100 mg Oral BID  . ipratropium  2 puff Inhalation Q6H  . losartan  50 mg Oral BID  . megestrol  400 mg Oral Daily  . pantoprazole  40 mg Oral Daily  . umeclidinium-vilanterol  1 puff Inhalation Daily  . vitamin B-12  1,000 mcg Oral Daily  . zinc sulfate  220 mg Oral Daily   Continuous Infusions:   LOS: 4  days     Enzo Bi, MD [facility] If 7PM-7AM, please contact night-coverage 08/19/2019, 6:11 PM

## 2019-08-20 LAB — BASIC METABOLIC PANEL
Anion gap: 7 (ref 5–15)
BUN: 34 mg/dL — ABNORMAL HIGH (ref 8–23)
CO2: 25 mmol/L (ref 22–32)
Calcium: 8.4 mg/dL — ABNORMAL LOW (ref 8.9–10.3)
Chloride: 97 mmol/L — ABNORMAL LOW (ref 98–111)
Creatinine, Ser: 1.3 mg/dL — ABNORMAL HIGH (ref 0.44–1.00)
GFR calc Af Amer: 45 mL/min — ABNORMAL LOW (ref >60)
GFR calc non Af Amer: 38 mL/min — ABNORMAL LOW (ref >60)
Glucose, Bld: 111 mg/dL — ABNORMAL HIGH (ref 70–99)
Potassium: 4.6 mmol/L (ref 3.5–5.1)
Sodium: 129 mmol/L — ABNORMAL LOW (ref 135–145)

## 2019-08-20 LAB — CBC
HCT: 29.8 % — ABNORMAL LOW (ref 36.0–46.0)
Hemoglobin: 9.7 g/dL — ABNORMAL LOW (ref 12.0–15.0)
MCH: 26.7 pg (ref 26.0–34.0)
MCHC: 32.6 g/dL (ref 30.0–36.0)
MCV: 82.1 fL (ref 80.0–100.0)
Platelets: 412 10*3/uL — ABNORMAL HIGH (ref 150–400)
RBC: 3.63 MIL/uL — ABNORMAL LOW (ref 3.87–5.11)
RDW: 14.7 % (ref 11.5–15.5)
WBC: 7.6 10*3/uL (ref 4.0–10.5)
nRBC: 0 % (ref 0.0–0.2)

## 2019-08-20 LAB — MAGNESIUM: Magnesium: 1.8 mg/dL (ref 1.7–2.4)

## 2019-08-20 MED ORDER — MENTHOL 3 MG MT LOZG
1.0000 | LOZENGE | OROMUCOSAL | Status: DC | PRN
  Filled 2019-08-20: qty 9

## 2019-08-20 MED ORDER — CLOPIDOGREL BISULFATE 75 MG PO TABS
75.0000 mg | ORAL_TABLET | Freq: Every day | ORAL | Status: DC
  Administered 2019-08-20 – 2019-08-21 (×2): 75 mg via ORAL
  Filled 2019-08-20 (×2): qty 1

## 2019-08-20 MED ORDER — ENOXAPARIN SODIUM 30 MG/0.3ML ~~LOC~~ SOLN
30.0000 mg | SUBCUTANEOUS | Status: DC
  Administered 2019-08-21: 09:00:00 30 mg via SUBCUTANEOUS
  Filled 2019-08-20: qty 0.3

## 2019-08-20 NOTE — Progress Notes (Signed)
PHARMACIST - PHYSICIAN COMMUNICATION  CONCERNING:  Enoxaparin (Lovenox) for DVT Prophylaxis    RECOMMENDATION: Patient was prescribed enoxaparin 40mg  q24 hours for VTE prophylaxis.   Filed Weights   08/14/19 0930 08/15/19 0628  Weight: 115 lb (52.2 kg) 114 lb 10.2 oz (52 kg)    Body mass index is 19.08 kg/m.  Estimated Creatinine Clearance: 27.9 mL/min (A) (by C-G formula based on SCr of 1.3 mg/dL (H)).   Based on [facility] policy patient is candidate for enoxaparin 30mg  every 24 hours based on CrCl <32ml/min  DESCRIPTION: Pharmacy has adjusted enoxaparin dose per [facility] policy.  Patient is now receiving enoxaparin 30mg  every 24 hours.  31m, PharmD Clinical Pharmacist  08/20/2019 7:19 PM

## 2019-08-20 NOTE — Progress Notes (Signed)
PROGRESS NOTE    Donna Quinn  JOA:[id number] DOB: [birth year] DOA: 08/14/2019 PCP: Donna Arbour, MD    Assessment & Plan:   Principal Problem:   Fall Active Problems:   HTN (hypertension)   HLD (hyperlipidemia)   COPD (chronic obstructive pulmonary disease) (HCC)   GERD (gastroesophageal reflux disease)   Hyponatremia   Stroke (HCC)   COVID-19 virus infection   CKD (chronic kidney disease), stage IIIa   Elevated troponin   Debility    Donna Quinn is a 84 y.o. Caucasian female with medical history significant of hypertension, hyperlipidemia, COPD, stroke, GERD, anxiety, AAA, brain aneurysm CKD 3, who presents with generalized weakness fall.   Fall and weakness Patient complains of pelvic pain.  X-ray of her pelvis is negative.  CT-head no acute finding. --Lives alone and walks with walker at baseline. PLAN: -PT/OT rec SNF --up to chair  COVID-19 virus PNA Patient does not have oxygen desaturation.  Chest x-ray showed "Right base infiltrate consistent pneumonia." -hold remdesivir and steroid since pt has no suppl O2 requirement. -Vitamin C and zinc  Mild Elevated trop 2/2 demand ischemia  Troponin I 65, flat.  No chest pain.  HTN (hypertension):  Blood pressure is elevated at 180/70 -continue home losartan -Continue home hydralazine -As needed hydralazine  HLD (hyperlipidemia): -lipitor  COPD (chronic obstructive pulmonary disease) (HCC): -Bronchodilators  GERD (gastroesophageal reflux disease): -PPI  Hyponatremia, chronic, stable --s/p MIVF --encourage PO hydration  Stroke [facility]): -continue plavix and lipitor  CKD (chronic kidney disease), stage IIIa: stable.  Cre 1.17  Urinary retention --Only 1 episode, and had I/O cath x1. --monitor urine output   DVT prophylaxis: Lovenox SQ Code Status: Full code  Disposition Plan: SNF when bed available, likely tomrorow   Subjective and Interval History:  Pt complained of sore throat  today.  Reported still having trouble urinating, though nurse reported pt had voided.  No fever, dyspnea, chest pain, abdominal pain, N/V/D.   Objective: Vitals:   08/20/19 1620 08/20/19 2056 08/20/19 2350 08/21/19 0827  BP: (!) 99/38 128/68 (!) 108/57 (!) 96/55  Pulse: 65 67 68 69  Resp:  18 17 17   Temp:  97.7 F (36.5 C) 98.1 F (36.7 C) 98.7 F (37.1 C)  TempSrc:  Oral  Oral  SpO2: 96% 95% 94% 95%  Weight:      Height:        Intake/Output Summary (Last 24 hours) at 08/21/2019 0847 Last data filed at 08/21/2019 0612 Gross per 24 hour  Intake --  Output 550 ml  Net -550 ml   Filed Weights   08/14/19 0930 08/15/19 0628  Weight: 52.2 kg 52 kg    Examination:   Constitutional: NAD, alert, oriented HEENT: conjunctivae and lids normal, EOMI CV: RRR no M,R,G. Distal pulses +2.  No cyanosis.   RESP: CTA B/L over anterior, normal respiratory effort, on RA GI: +BS, NTND Extremities: No effusions, edema, or tenderness in BLE SKIN: warm, dry and intact Neuro: II - XII grossly intact.  Sensation intact   Data Reviewed: I have personally reviewed following labs and imaging studies  CBC: Recent Labs  Lab 08/15/19 0130 08/16/19 0323 08/17/19 0826 08/18/19 0455 08/20/19 1538  WBC 4.1 5.3 6.3 6.1 7.6  NEUTROABS 3.1 4.2 4.7 4.9  --   HGB 8.9* 9.1* 9.3* 8.5* 9.7*  HCT 26.8* 28.2* 28.0* 25.6* 29.8*  MCV 82.0 82.5 81.2 81.0 82.1  PLT 217 245 284 285 412*   Basic Metabolic Panel: Recent  Labs  Lab 08/15/19 0130 08/15/19 0833 08/16/19 0323 08/17/19 0826 08/18/19 0455 08/20/19 1538  NA 130* 131* 131* 132* 130* 129*  K 4.2 4.4 4.1 4.2 4.3 4.6  CL 97* 96* 98 98 98 97*  CO2 25 24 23 24 24 25   GLUCOSE 73 78 70 85 86 111*  BUN 22 21 16 19 18  34*  CREATININE 1.01* 0.96 0.94 1.07* 1.03* 1.30*  CALCIUM 7.9* 8.1* 8.0* 8.0* 7.8* 8.4*  MG 1.8  --  1.7 1.9 1.8 1.8   GFR: Estimated Creatinine Clearance: 27.9 mL/min (A) (by C-G formula based on SCr of 1.3 mg/dL (H)). Liver  Function Tests: Recent Labs  Lab 08/15/19 0130 08/16/19 0323 08/17/19 0826 08/18/19 0455  AST 39 34 31 27  ALT 26 22 21 21   ALKPHOS 53 48 48 43  BILITOT 0.6 0.8 0.7 0.5  PROT 5.6* 5.7* 5.8* 5.4*  ALBUMIN 2.8* 2.7* 2.6* 2.4*   No results for input(s): LIPASE, AMYLASE in the last 168 hours. No results for input(s): AMMONIA in the last 168 hours. Coagulation Profile: No results for input(s): INR, PROTIME in the last 168 hours. Cardiac Enzymes: No results for input(s): CKTOTAL, CKMB, CKMBINDEX, TROPONINI in the last 168 hours. BNP (last 3 results) No results for input(s): PROBNP in the last 8760 hours. HbA1C: No results for input(s): HGBA1C in the last 72 hours. CBG: No results for input(s): GLUCAP in the last 168 hours. Lipid Profile: No results for input(s): CHOL, HDL, LDLCALC, TRIG, CHOLHDL, LDLDIRECT in the last 72 hours. Thyroid Function Tests: No results for input(s): TSH, T4TOTAL, FREET4, T3FREE, THYROIDAB in the last 72 hours. Anemia Panel: No results for input(s): VITAMINB12, FOLATE, FERRITIN, TIBC, IRON, RETICCTPCT in the last 72 hours. Sepsis Labs: Recent Labs  Lab 08/14/19 0940 08/14/19 1151 08/14/19 1518  PROCALCITON  --   --  0.11  LATICACIDVEN 1.0 1.1  --     No results found for this or any previous visit (from the past 240 hour(s)).    Radiology Studies: No results found.   Scheduled Meds: . AeroChamber Plus Flo-Vu Large  1 each Other Once  . amitriptyline  25 mg Oral QHS  . vitamin C  500 mg Oral Daily  . atorvastatin  40 mg Oral QPM  . clopidogrel  75 mg Oral Daily  . dextromethorphan-guaiFENesin  1 tablet Oral BID  . DULoxetine  20 mg Oral Daily  . enoxaparin (LOVENOX) injection  30 mg Subcutaneous Q24H  . feeding supplement (ENSURE ENLIVE)  237 mL Oral BID BM  . gabapentin  300 mg Oral BID  . hydrALAZINE  100 mg Oral BID  . ipratropium  2 puff Inhalation Q6H  . losartan  50 mg Oral BID  . megestrol  400 mg Oral Daily  . pantoprazole  40  mg Oral Daily  . umeclidinium-vilanterol  1 puff Inhalation Daily  . vitamin B-12  1,000 mcg Oral Daily  . zinc sulfate  220 mg Oral Daily   Continuous Infusions:   LOS: 6 days     Donna Bi, MD [facility] If 7PM-7AM, please contact night-coverage 08/21/2019, 8:47 AM

## 2019-08-20 NOTE — Plan of Care (Signed)
  Problem: Education: Goal: Knowledge of risk factors and measures for prevention of condition will improve Outcome: Progressing   

## 2019-08-20 NOTE — TOC Progression Note (Signed)
Transition of Care [facility]) - Progression Note    Patient Details  Name: Donna Quinn MRN: [id number] Date of Birth: February 21, 1936  [facility] CM/SW Contact  Allayne Butcher, RN Phone Number: 08/20/2019, 3:00 PM  Clinical Narrative:    [facility] in [place] will have a bed available tomorrow.  Patient's daughter Synetta Fail updated that patient will discharge to SNF tomorrow.   Expected Discharge Plan: Skilled Nursing Facility Barriers to Discharge: Insurance Authorization  Expected Discharge Plan and Services Expected Discharge Plan: Skilled Nursing Facility   Discharge Planning Services: CM Consult Post Acute Care Choice: Skilled Nursing Facility Living arrangements for the past 2 months: Single Family Home Expected Discharge Date: 08/15/19                                     Social Determinants of Health (SDOH) Interventions    Readmission Risk Interventions No flowsheet data found.

## 2019-08-21 DIAGNOSIS — Z7401 Bed confinement status: Secondary | ICD-10-CM | POA: Diagnosis not present

## 2019-08-21 DIAGNOSIS — U071 COVID-19: Secondary | ICD-10-CM | POA: Diagnosis not present

## 2019-08-21 DIAGNOSIS — D649 Anemia, unspecified: Secondary | ICD-10-CM | POA: Diagnosis not present

## 2019-08-21 DIAGNOSIS — R6889 Other general symptoms and signs: Secondary | ICD-10-CM | POA: Diagnosis not present

## 2019-08-21 DIAGNOSIS — I10 Essential (primary) hypertension: Secondary | ICD-10-CM | POA: Diagnosis not present

## 2019-08-21 DIAGNOSIS — D519 Vitamin B12 deficiency anemia, unspecified: Secondary | ICD-10-CM | POA: Diagnosis not present

## 2019-08-21 DIAGNOSIS — Z8673 Personal history of transient ischemic attack (TIA), and cerebral infarction without residual deficits: Secondary | ICD-10-CM | POA: Diagnosis not present

## 2019-08-21 DIAGNOSIS — W19XXXA Unspecified fall, initial encounter: Secondary | ICD-10-CM | POA: Diagnosis not present

## 2019-08-21 DIAGNOSIS — M2550 Pain in unspecified joint: Secondary | ICD-10-CM | POA: Diagnosis not present

## 2019-08-21 DIAGNOSIS — F411 Generalized anxiety disorder: Secondary | ICD-10-CM | POA: Diagnosis not present

## 2019-08-21 DIAGNOSIS — N1831 Chronic kidney disease, stage 3a: Secondary | ICD-10-CM | POA: Diagnosis not present

## 2019-08-21 DIAGNOSIS — E871 Hypo-osmolality and hyponatremia: Secondary | ICD-10-CM | POA: Diagnosis not present

## 2019-08-21 DIAGNOSIS — R0902 Hypoxemia: Secondary | ICD-10-CM | POA: Diagnosis not present

## 2019-08-21 DIAGNOSIS — J449 Chronic obstructive pulmonary disease, unspecified: Secondary | ICD-10-CM | POA: Diagnosis not present

## 2019-08-21 DIAGNOSIS — N183 Chronic kidney disease, stage 3 unspecified: Secondary | ICD-10-CM | POA: Diagnosis not present

## 2019-08-21 DIAGNOSIS — E785 Hyperlipidemia, unspecified: Secondary | ICD-10-CM | POA: Diagnosis not present

## 2019-08-21 DIAGNOSIS — K219 Gastro-esophageal reflux disease without esophagitis: Secondary | ICD-10-CM | POA: Diagnosis not present

## 2019-08-21 DIAGNOSIS — E559 Vitamin D deficiency, unspecified: Secondary | ICD-10-CM | POA: Diagnosis not present

## 2019-08-21 MED ORDER — LOSARTAN POTASSIUM 50 MG PO TABS: 50.0000 mg | ORAL_TABLET | Freq: Every day | ORAL | Status: AC

## 2019-08-21 MED ORDER — ENSURE ENLIVE PO LIQD: 237.0000 mL | Freq: Two times a day (BID) | ORAL | Status: AC

## 2019-08-21 MED ORDER — HYDRALAZINE HCL 100 MG PO TABS: 50.0000 mg | ORAL_TABLET | Freq: Two times a day (BID) | ORAL | Status: AC

## 2019-08-21 MED ORDER — HYDRALAZINE HCL 50 MG PO TABS: 50.0000 mg | ORAL_TABLET | Freq: Two times a day (BID) | ORAL | Status: DC

## 2019-08-21 MED ORDER — AEROCHAMBER PLUS FLO-VU LARGE MISC: 1.0000 | Freq: Once | 0 refills | Status: AC

## 2019-08-21 NOTE — TOC Transition Note (Signed)
Transition of Care [facility]) - CM/SW Discharge Note   Patient Details  Name: Donna Quinn MRN: [id number] Date of Birth: Feb 07, 1936  Transition of Care [facility]) CM/SW Contact:  Allayne Butcher, RN Phone Number: 08/21/2019, 10:12 AM   Clinical Narrative:    Patient is ready for discharge today.  Patient will discharge to [facility] in [place].  Patient is going to room 207-2 and bedside RN will call report to [phone].  Patient will transport via Customer service manager.  Patient's daughter Donna Quinn updated.     Final next level of care: Skilled Nursing Facility Barriers to Discharge: Barriers Resolved   Patient Goals and CMS Choice Patient states their goals for this hospitalization and ongoing recovery are:: Wants to go home CMS Medicare.gov Compare Post Acute Care list provided to:: Patient Represenative (must comment) Choice offered to / list presented to : Adult Children(daughter Donna Quinn)  Discharge Placement   Existing PASRR number confirmed : 08/17/19          Patient [facility] bed at: [facility] Patient to be transferred to facility by: [facility] Name of family member notified: Donna Quinn- daughter Patient and family notified of of transfer: 08/21/19  Discharge Plan and Services   Discharge Planning Services: CM Consult Post Acute Care Choice: Skilled Nursing Facility                               Social Determinants of Health (SDOH) Interventions     Readmission Risk Interventions No flowsheet data found.

## 2019-08-21 NOTE — Care Management Important Message (Signed)
Important Message  Patient Details  Name: Donna Quinn MRN: [id number] Date of Birth: [birth year]   Medicare Important Message Given:  Yes Patient is COVID +- IM reviewed with patient's daughter Donna Quinn via phone.  IM given to bedside RN to deliver to patient's room.     Allayne Butcher, RN 08/21/2019, 11:24 AM

## 2019-08-21 NOTE — Progress Notes (Addendum)
Physical Therapy Treatment Patient Details Name: Donna Quinn MRN: [id number] DOB: [birth year] Today's Date: 08/21/2019    History of Present Illness Donna Quinn is an 84 y.o. female with medical history significant of hypertension, hyperlipidemia, COPD, stroke, GERD, anxiety, AAA, brain aneurysm CKD 3, who presents with generalized weakness fall.  MD assessment includes: pelvic pain with X-ray of her pelvis negative, COVID-19, elevated troponin possibly due to demand ischemia, HTN, HLD, COPD, Hyponatremia, h/o CVA, CKD III.    PT Comments    Pt in bed upon entry, agreeable to partcipate, denies pain but complains for somnolence multiple times, pt nodding off in session frequently. Author attempted functional mobility interventions first with attempts to improve alertness, but ultimately even after transfer to recliner, pt continues to fall asleep during exercises. RN made aware. Pt agreeable to sit up in recliner for a time.    Follow Up Recommendations  SNF     Equipment Recommendations  None recommended by PT    Recommendations for Other Services       Precautions / Restrictions Precautions Precautions: Fall Restrictions Weight Bearing Restrictions: No Other Position/Activity Restrictions: Keep SpO2 > 92%    Mobility  Bed Mobility Overal bed mobility: Needs Assistance Bed Mobility: Supine to Sit     Supine to sit: Max assist        Transfers Overall transfer level: Needs assistance Equipment used: 1 person hand held assist Transfers: Squat Pivot Transfers     Squat pivot transfers: Max assist        Ambulation/Gait                 Stairs             Wheelchair Mobility    Modified Rankin (Stroke Patients Only)       Balance                                            Cognition Arousal/Alertness: Lethargic Behavior During Therapy: (slightly confused and perseverating) Overall Cognitive Status: No  family/caregiver present to determine baseline cognitive functioning                                 General Comments: pt slow to answer questions (HOH?) but follows commands with additional time      Exercises Other Exercises Other Exercises: Seated marching 1x15 bilat, AA/ROM modA required for cuing and weakness Other Exercises: Seated LAQ 1x10 bilat; AA/ROM for tactile cues. Pt falls asleep multiple times and not following commands for completion.    General Comments        Pertinent Vitals/Pain Pain Assessment: No/denies pain    Home Living                      Prior Function            PT Goals (current goals can now be found in the care plan section) Acute Rehab PT Goals Patient Stated Goal: To get stronger PT Goal Formulation: With patient Time For Goal Achievement: 08/28/19 Potential to Achieve Goals: Fair Progress towards PT goals: Progressing toward goals    Frequency           PT Plan Current plan remains appropriate    Co-evaluation  AM-PAC PT "6 Clicks" Mobility   Outcome Measure  Help needed turning from your back to your side while in a flat bed without using bedrails?: Total Help needed moving from lying on your back to sitting on the side of a flat bed without using bedrails?: Total Help needed moving to and from a bed to a chair (including a wheelchair)?: Total Help needed standing up from a chair using your arms (e.g., wheelchair or bedside chair)?: Total Help needed to walk in hospital room?: Total Help needed climbing 3-5 steps with a railing? : Total 6 Click Score: 6    End of Session   Activity Tolerance: Patient tolerated treatment well;Patient limited by lethargy;Patient limited by fatigue Patient left: with call bell/phone within reach;in chair;with chair alarm set Nurse Communication: Mobility status PT Visit Diagnosis: History of falling (Z91.81);Difficulty in walking, not elsewhere  classified (R26.2);Muscle weakness (generalized) (M62.81)     Time: 2778-2423 PT Time Calculation (min) (ACUTE ONLY): 17 min  Charges:  $Therapeutic Activity: 8-22 mins                     12:52 PM, 08/21/19 Etta Grandchild, PT, DPT Physical Therapist - [facility]  [phone] ([place])    Aime Carreras C 08/21/2019, 12:44 PM

## 2019-08-21 NOTE — Discharge Summary (Signed)
Physician Discharge Summary   Donna Quinn  female DOB: 25-Oct-1935  UEA:[id number]  PCP: Marguarite Arbour, MD  Admit date: 08/14/2019 Discharge date:   Admitted From: home Disposition:  SNF CODE STATUS: Full code  Discharge Instructions    Diet - low sodium heart healthy   Complete by: As directed    Diet - low sodium heart healthy   Complete by: As directed    Increase activity slowly   Complete by: As directed    Increase activity slowly   Complete by: As directed        Hospital Course:  For full details, please see H&P, progress notes, consult notes and ancillary notes.  Briefly,  Donna Iacovelli Simmonsis a 84 y.o.Caucasian femalewith medical history significant ofhypertension, hyperlipidemia, COPD, stroke, GERD, anxiety, AAA, brain aneurysm CKD 3, who presents with generalized weakness fall.   Fall and weakness Patient complains of pelvic pain. X-ray of her pelvis is negative.  CT-head no acute finding.  Pt was living alone and walking with walker at baseline.  Weakness may be due to COVID illness.  PT/OT rec SNF rehab.  COVID-19 virus PNA Patient does not have oxygen desaturation. Chest x-ray showed "Right base infiltrate consistent pneumonia."  Remdesivir and steroid were not started since pt has no suppl O2 requirement.  Pt received Vitamin C andzinc during her hospitalization.  Mild Elevated trop 2/2 demand ischemia Troponin I 65, flat. No chest pain.  HTN (hypertension) Pt was continued on home losartan and hydralazine, however, BP started trending down prior to discharge, so hydralazine is decreased to 50 mg BID (down from 100 mg BID).  HLD (hyperlipidemia): Continued lipitor  COPD (chronic obstructive pulmonary disease) (HCC): Continued Bronchodilators  GERD (gastroesophageal reflux disease): Continued PPI  Hyponatremia, chronic, stable Pt received gentle MIVF on presentation, and then encouraged PO hydration  Hx of Stroke  [facility]): Continued plavix and lipitor  CKD (chronic kidney disease), stage IIIa: stable.   Urinary retention, resolved Only 1 episode, and had I/O cath x1, but has since been able to void on her own.   Discharge Diagnoses:  Principal Problem:   Fall Active Problems:   HTN (hypertension)   HLD (hyperlipidemia)   COPD (chronic obstructive pulmonary disease) (HCC)   GERD (gastroesophageal reflux disease)   Hyponatremia   Stroke (HCC)   COVID-19 virus infection   CKD (chronic kidney disease), stage IIIa   Elevated troponin   Debility    Discharge Instructions:  Allergies as of 08/21/2019      Reactions   Aggrenox [aspirin-dipyridamole Er] Other (See Comments)   Reaction: A really bad headache   Augmentin [amoxicillin-pot Clavulanate] Other (See Comments)   Reaction: unknown   Penicillins Other (See Comments)   Has patient had a PCN reaction causing immediate rash, facial/tongue/throat swelling, SOB or lightheadedness with hypotension: Yes Has patient had a PCN reaction causing severe rash involving mucus membranes or skin necrosis: Yes Has patient had a PCN reaction that required hospitalization No Has patient had a PCN reaction occurring within the last 10 years: No If all of the above answers are "NO", then may proceed with Cephalosporin use.   Remeron [mirtazapine] Other (See Comments)   Broke out inside the mouth   Sulfa Antibiotics Hives, Other (See Comments)   Broke out in welps and had to be hospitalized      Medication List    TAKE these medications   acetaminophen 500 MG tablet Commonly known as: TYLENOL Take 1 tablet (500  mg total) by mouth every 6 (six) hours as needed for moderate pain or headache.   AeroChamber Plus Flo-Vu Large Misc 1 each by Other route once for 1 dose.   amitriptyline 25 MG tablet Commonly known as: ELAVIL Take 25 mg by mouth at bedtime.   Anoro Ellipta 62.5-25 MCG/INH Aepb Generic drug: umeclidinium-vilanterol Inhale 1 puff  into the lungs daily.   atorvastatin 40 MG tablet Commonly known as: LIPITOR Take 40 mg by mouth every evening.   clopidogrel 75 MG tablet Commonly known as: PLAVIX Take 75 mg by mouth every morning.   DULoxetine 20 MG capsule Commonly known as: CYMBALTA Take 20 mg by mouth daily.   Easy-Lax Plus 8.6-50 MG tablet Generic drug: senna-docusate Take 2 tablets by mouth daily as needed for mild constipation.   feeding supplement (ENSURE ENLIVE) Liqd Take 237 mLs by mouth 2 (two) times daily between meals.   furosemide 20 MG tablet Commonly known as: LASIX Take 20 mg by mouth daily as needed.   gabapentin 300 MG capsule Commonly known as: NEURONTIN Take 300-600 mg by mouth 2 (two) times daily.   hydrALAZINE 100 MG tablet Commonly known as: APRESOLINE Take 0.5 tablets (50 mg total) by mouth 2 (two) times daily. What changed: how much to take   ipratropium-albuterol 0.5-2.5 (3) MG/3ML Soln Commonly known as: DUONEB Inhale 3 mLs into the lungs 4 (four) times daily.   LORazepam 1 MG tablet Commonly known as: ATIVAN Take 1 mg by mouth at bedtime as needed for anxiety or sleep.   losartan 50 MG tablet Commonly known as: COZAAR Take 1 tablet (50 mg total) by mouth daily. What changed: when to take this   megestrol 40 MG/ML suspension Commonly known as: MEGACE Take 400 mg by mouth daily.   omeprazole 20 MG capsule Commonly known as: PRILOSEC Take 20 mg by mouth 2 (two) times daily before a meal.   Symbicort 160-4.5 MCG/ACT inhaler Generic drug: budesonide-formoterol Inhale 2 puffs into the lungs 2 (two) times daily.   vitamin B-12 1000 MCG tablet Commonly known as: CYANOCOBALAMIN Take 1,000 mcg by mouth daily.   Vitamin D (Ergocalciferol) 1.25 MG (50000 UNIT) Caps capsule Commonly known as: DRISDOL Take 1 capsule by mouth once a week. Takes on Thursday        Contact information for follow-up providers    Marguarite Arbour, MD Follow up in 1 week(s).    Specialty: Internal Medicine Contact information: [address] [place] [address] [phone]            Contact information for after-discharge care    Destination    [facility] Preferred SNF .   Service: Skilled Nursing Contact information: [address] [place] [address] [phone]                  Allergies  Allergen Reactions  . Aggrenox [Aspirin-Dipyridamole Er] Other (See Comments)    Reaction: A really bad headache  . Augmentin [Amoxicillin-Pot Clavulanate] Other (See Comments)    Reaction: unknown  . Penicillins Other (See Comments)    Has patient had a PCN reaction causing immediate rash, facial/tongue/throat swelling, SOB or lightheadedness with hypotension: Yes Has patient had a PCN reaction causing severe rash involving mucus membranes or skin necrosis: Yes Has patient had a PCN reaction that required hospitalization No Has patient had a PCN reaction occurring within the last 10 years: No If all of the above answers are "NO", then may proceed  with Cephalosporin use.  . Remeron [Mirtazapine] Other (See Comments)    Broke out inside the mouth  . Sulfa Antibiotics Hives and Other (See Comments)    Broke out in welps and had to be hospitalized     The results of significant diagnostics from this hospitalization (including imaging, microbiology, ancillary and laboratory) are listed below for reference.   Consultations:   Procedures/Studies: DG Chest 2 View  Result Date: 08/14/2019 CLINICAL DATA:  Shortness of breath. EXAM: CHEST - 2 VIEW COMPARISON:  08/05/2018.  CT 03/21/2018. FINDINGS: Mediastinum and hilar structures normal. Right base infiltrate consistent with pneumonia. Small right pleural effusion. No pneumothorax. Biapical pleural thickening consistent scarring. Cardiomegaly with normal pulmonary vascularity. Aortic and peripheral vascular calcification. No acute bony abnormality identified. IMPRESSION: 1.  Right base infiltrate consistent pneumonia. Small right pleural effusion. 2.  Cardiomegaly.  No pulmonary venous congestion. 3.  Sliding hiatal hernia again noted. Electronically Signed   By: Maisie Fus  Register   On: 08/14/2019 10:31   DG Pelvis 1-2 Views  Result Date: 08/14/2019 CLINICAL DATA:  Fall. EXAM: PELVIS - 1-2 VIEW COMPARISON:  05/02/2018. FINDINGS: Diffuse osteopenia. Degenerative changes lumbar spine and left hip. Total right hip replacement. No acute bony abnormality. No evidence of fracture dislocation. Stable sclerotic density left buttock regions suggesting injection granuloma. Pelvic calcifications consistent with phleboliths. Aortoiliac and peripheral atherosclerotic vascular calcification. Bilateral iliac stents. IMPRESSION: 1. Diffuse osteopenia. Degenerative changes lumbar spine left hip. Total right hip replacement. No acute bony abnormality. 2. Aortoiliac and peripheral vascular disease. Bilateral iliac stents. Electronically Signed   By: Maisie Fus  Register   On: 08/14/2019 12:14   CT HEAD WO CONTRAST  Result Date: 08/14/2019 CLINICAL DATA:  Headache with head trauma. Pain to the back of the head. EXAM: CT HEAD WITHOUT CONTRAST TECHNIQUE: Contiguous axial images were obtained from the base of the skull through the vertex without intravenous contrast. COMPARISON:  None. FINDINGS: Brain: No evidence of acute infarction, hemorrhage, hydrocephalus, extra-axial collection or mass lesion/mass effect. Atrophy and chronic microvascular ischemic changes are noted. There is encephalomalacia involving the left occipital lobe as well as the left frontal lobe and left cerebellum. The study was degraded by motion artifact. Vascular: Again noted are metallic coils within the left suprasellar region is seen on multiple prior studies. Skull: Normal. Negative for fracture or focal lesion. Sinuses/Orbits: No acute finding. Other: None. IMPRESSION: 1. Motion degraded study. 2. No acute intracranial abnormality.  3. Chronic findings as detailed above. Electronically Signed   By: Katherine Mantle M.D.   On: 08/14/2019 19:04      Labs: BNP (last 3 results) Recent Labs    08/14/19 1518  BNP 577.0*   Basic Metabolic Panel: Recent Labs  Lab 08/15/19 0130 08/15/19 0833 08/16/19 0323 08/17/19 0826 08/18/19 0455 08/20/19 1538  NA 130* 131* 131* 132* 130* 129*  K 4.2 4.4 4.1 4.2 4.3 4.6  CL 97* 96* 98 98 98 97*  CO2 25 24 23 24 24 25   GLUCOSE 73 78 70 85 86 111*  BUN 22 21 16 19 18  34*  CREATININE 1.01* 0.96 0.94 1.07* 1.03* 1.30*  CALCIUM 7.9* 8.1* 8.0* 8.0* 7.8* 8.4*  MG 1.8  --  1.7 1.9 1.8 1.8   Liver Function Tests: Recent Labs  Lab 08/15/19 0130 08/16/19 0323 08/17/19 0826 08/18/19 0455  AST 39 34 31 27  ALT 26 22 21 21   ALKPHOS 53 48 48 43  BILITOT 0.6 0.8 0.7 0.5  PROT 5.6* 5.7* 5.8*  5.4*  ALBUMIN 2.8* 2.7* 2.6* 2.4*   No results for input(s): LIPASE, AMYLASE in the last 168 hours. No results for input(s): AMMONIA in the last 168 hours. CBC: Recent Labs  Lab 08/15/19 0130 08/16/19 0323 08/17/19 0826 08/18/19 0455 08/20/19 1538  WBC 4.1 5.3 6.3 6.1 7.6  NEUTROABS 3.1 4.2 4.7 4.9  --   HGB 8.9* 9.1* 9.3* 8.5* 9.7*  HCT 26.8* 28.2* 28.0* 25.6* 29.8*  MCV 82.0 82.5 81.2 81.0 82.1  PLT 217 245 284 285 412*   Cardiac Enzymes: No results for input(s): CKTOTAL, CKMB, CKMBINDEX, TROPONINI in the last 168 hours. BNP: Invalid input(s): POCBNP CBG: No results for input(s): GLUCAP in the last 168 hours. D-Dimer No results for input(s): DDIMER in the last 72 hours. Hgb A1c No results for input(s): HGBA1C in the last 72 hours. Lipid Profile No results for input(s): CHOL, HDL, LDLCALC, TRIG, CHOLHDL, LDLDIRECT in the last 72 hours. Thyroid function studies No results for input(s): TSH, T4TOTAL, T3FREE, THYROIDAB in the last 72 hours.  Invalid input(s): FREET3 Anemia work up No results for input(s): VITAMINB12, FOLATE, FERRITIN, TIBC, IRON, RETICCTPCT in the last  72 hours. Urinalysis    Component Value Date/Time   COLORURINE YELLOW (A) 08/14/2019 0950   APPEARANCEUR CLEAR (A) 08/14/2019 0950   LABSPEC 1.013 08/14/2019 0950   PHURINE 6.0 08/14/2019 0950   GLUCOSEU NEGATIVE 08/14/2019 0950   HGBUR NEGATIVE 08/14/2019 0950   BILIRUBINUR NEGATIVE 08/14/2019 0950   KETONESUR NEGATIVE 08/14/2019 0950   PROTEINUR 30 (A) 08/14/2019 0950   NITRITE NEGATIVE 08/14/2019 0950   LEUKOCYTESUR NEGATIVE 08/14/2019 0950   Sepsis Labs Invalid input(s): PROCALCITONIN,  WBC,  LACTICIDVEN Microbiology No results found for this or any previous visit (from the past 240 hour(s)).   Total time spend on discharging this patient, including the last patient exam, discussing the hospital stay, instructions for ongoing care as it relates to all pertinent caregivers, as well as preparing the medical discharge records, prescriptions, and/or referrals as applicable, is 30 minutes.    Enzo Bi, MD  [facility] 08/21/2019, 11:32 AM  If 7PM-7AM, please contact night-coverage

## 2019-08-21 NOTE — Progress Notes (Signed)
D/c education given to pt. Report given to RN at [facility] center. Daughter notified and updated on d/c

## 2019-08-23 DIAGNOSIS — N183 Chronic kidney disease, stage 3 unspecified: Secondary | ICD-10-CM | POA: Diagnosis not present

## 2019-08-23 DIAGNOSIS — U071 COVID-19: Secondary | ICD-10-CM | POA: Diagnosis not present

## 2019-08-23 DIAGNOSIS — J449 Chronic obstructive pulmonary disease, unspecified: Secondary | ICD-10-CM | POA: Diagnosis not present

## 2019-08-23 DIAGNOSIS — E785 Hyperlipidemia, unspecified: Secondary | ICD-10-CM | POA: Diagnosis not present

## 2019-09-10 DEATH — deceased
